# Patient Record
Sex: Female | Born: 1990 | Race: White | Hispanic: No | Marital: Married | Smoking: Current some day smoker
Health system: Southern US, Community
[De-identification: ages and names within clinical notes are randomized; demographics above are authoritative.]

## PROBLEM LIST (undated history)

## (undated) ENCOUNTER — Inpatient Hospital Stay (HOSPITAL_COMMUNITY): Payer: Self-pay

## (undated) DIAGNOSIS — D6959 Other secondary thrombocytopenia: Secondary | ICD-10-CM

## (undated) DIAGNOSIS — F1110 Opioid abuse, uncomplicated: Secondary | ICD-10-CM

## (undated) DIAGNOSIS — D696 Thrombocytopenia, unspecified: Secondary | ICD-10-CM

## (undated) DIAGNOSIS — F1010 Alcohol abuse, uncomplicated: Secondary | ICD-10-CM

## (undated) DIAGNOSIS — O99119 Other diseases of the blood and blood-forming organs and certain disorders involving the immune mechanism complicating pregnancy, unspecified trimester: Secondary | ICD-10-CM

---
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
## 2009-08-22 ENCOUNTER — Ambulatory Visit (HOSPITAL_COMMUNITY): Admission: RE | Admit: 2009-08-22 | Discharge: 2009-08-22 | Payer: Self-pay | Admitting: Obstetrics & Gynecology

## 2009-10-03 ENCOUNTER — Ambulatory Visit (HOSPITAL_COMMUNITY): Admission: RE | Admit: 2009-10-03 | Discharge: 2009-10-03 | Payer: Self-pay | Admitting: Obstetrics & Gynecology

## 2010-02-21 ENCOUNTER — Ambulatory Visit: Payer: Self-pay | Admitting: Obstetrics & Gynecology

## 2010-02-21 ENCOUNTER — Inpatient Hospital Stay (HOSPITAL_COMMUNITY): Admission: AD | Admit: 2010-02-21 | Discharge: 2010-02-25 | Payer: Self-pay | Admitting: Obstetrics & Gynecology

## 2010-09-06 LAB — CBC
HCT: 23 % — ABNORMAL LOW (ref 36.0–46.0)
HCT: 29.8 % — ABNORMAL LOW (ref 36.0–46.0)
HCT: 31.7 % — ABNORMAL LOW (ref 36.0–46.0)
Hemoglobin: 10.7 g/dL — ABNORMAL LOW (ref 12.0–15.0)
MCH: 29.8 pg (ref 26.0–34.0)
MCHC: 34.6 g/dL (ref 30.0–36.0)
MCV: 88.3 fL (ref 78.0–100.0)
Platelets: 150 10*3/uL (ref 150–400)
Platelets: 173 10*3/uL (ref 150–400)
RBC: 2.76 MIL/uL — ABNORMAL LOW (ref 3.87–5.11)
RBC: 3.42 MIL/uL — ABNORMAL LOW (ref 3.87–5.11)
RBC: 3.59 MIL/uL — ABNORMAL LOW (ref 3.87–5.11)
RDW: 12.9 % (ref 11.5–15.5)
WBC: 14.5 10*3/uL — ABNORMAL HIGH (ref 4.0–10.5)

## 2010-09-06 LAB — COMPREHENSIVE METABOLIC PANEL
ALT: 17 U/L (ref 0–35)
AST: 33 U/L (ref 0–37)
BUN: 3 mg/dL — ABNORMAL LOW (ref 6–23)
Calcium: 6.6 mg/dL — ABNORMAL LOW (ref 8.4–10.5)
Creatinine, Ser: 0.85 mg/dL (ref 0.4–1.2)
GFR calc Af Amer: >60 mL/min (ref >60)
Glucose, Bld: 87 mg/dL (ref 70–99)
Potassium: 4.2 mEq/L (ref 3.5–5.1)
Sodium: 133 mEq/L — ABNORMAL LOW (ref 135–145)
Total Bilirubin: 0.5 mg/dL (ref 0.3–1.2)
Total Protein: 5.3 g/dL — ABNORMAL LOW (ref 6.0–8.3)

## 2010-09-06 LAB — MRSA PCR SCREENING: MRSA by PCR: NEGATIVE

## 2010-09-06 LAB — BASIC METABOLIC PANEL
BUN: 12 mg/dL (ref 6–23)
Calcium: 7.4 mg/dL — ABNORMAL LOW (ref 8.4–10.5)
Chloride: 108 mEq/L (ref 96–112)

## 2010-09-07 LAB — COMPREHENSIVE METABOLIC PANEL
ALT: 16 U/L (ref 0–35)
Albumin: 2.9 g/dL — ABNORMAL LOW (ref 3.5–5.2)
Alkaline Phosphatase: 251 U/L — ABNORMAL HIGH (ref 39–117)
CO2: 20 mEq/L (ref 19–32)
GFR calc Af Amer: >60 mL/min (ref >60)
GFR calc non Af Amer: >60 mL/min (ref >60)
Potassium: 3.9 mEq/L (ref 3.5–5.1)
Sodium: 135 mEq/L (ref 135–145)

## 2010-09-07 LAB — CBC
HCT: 31.2 % — ABNORMAL LOW (ref 36.0–46.0)
MCH: 29.8 pg (ref 26.0–34.0)
MCV: 87.9 fL (ref 78.0–100.0)
Platelets: 175 10*3/uL (ref 150–400)
WBC: 12.7 10*3/uL — ABNORMAL HIGH (ref 4.0–10.5)

## 2011-04-11 LAB — OB RESULTS CONSOLE RPR: RPR: NONREACTIVE

## 2011-04-11 LAB — OB RESULTS CONSOLE HIV ANTIBODY (ROUTINE TESTING): HIV: NONREACTIVE

## 2011-05-01 ENCOUNTER — Other Ambulatory Visit: Payer: Self-pay | Admitting: Obstetrics and Gynecology

## 2011-05-01 ENCOUNTER — Other Ambulatory Visit (HOSPITAL_COMMUNITY)
Admission: RE | Admit: 2011-05-01 | Discharge: 2011-05-01 | Disposition: A | Discharge status: Home / Self Care | Payer: BC Managed Care – PPO | Source: Ambulatory Visit | Attending: Obstetrics and Gynecology | Admitting: Obstetrics and Gynecology

## 2011-05-01 DIAGNOSIS — Z113 Encounter for screening for infections with a predominantly sexual mode of transmission: Secondary | ICD-10-CM | POA: Insufficient documentation

## 2011-05-01 DIAGNOSIS — Z01419 Encounter for gynecological examination (general) (routine) without abnormal findings: Secondary | ICD-10-CM | POA: Insufficient documentation

## 2011-11-15 ENCOUNTER — Encounter (HOSPITAL_COMMUNITY): Payer: Self-pay

## 2011-11-22 ENCOUNTER — Encounter (HOSPITAL_COMMUNITY): Payer: Self-pay

## 2011-11-22 ENCOUNTER — Encounter (HOSPITAL_COMMUNITY)
Admission: RE | Admit: 2011-11-22 | Discharge: 2011-11-22 | Disposition: A | Discharge status: Home / Self Care | Payer: BC Managed Care – PPO | Source: Ambulatory Visit | Attending: Obstetrics & Gynecology | Admitting: Obstetrics & Gynecology

## 2011-11-22 LAB — CBC
MCV: 96.5 fL (ref 78.0–100.0)
Platelets: 127 10*3/uL — ABNORMAL LOW (ref 150–400)
RBC: 3.73 MIL/uL — ABNORMAL LOW (ref 3.87–5.11)
RDW: 13.4 % (ref 11.5–15.5)
WBC: 12 10*3/uL — ABNORMAL HIGH (ref 4.0–10.5)

## 2011-11-22 LAB — SURGICAL PCR SCREEN
MRSA, PCR: NEGATIVE
Staphylococcus aureus: POSITIVE — AB

## 2011-11-22 NOTE — Patient Instructions (Addendum)
19 Caroline Brooks  11/22/2011   Your procedure is scheduled on:  11/26/11  Enter through the Main Entrance of [facility] at 6 AM.  Pick up the phone at the desk and dial [phone].   Call this number if you have problems the morning of surgery: [phone]   Remember:   Do not eat food:After Midnight.  Do not drink clear liquids: After Midnight.  Take these medicines the morning of surgery with A SIP OF WATER: NA   Do not wear jewelry, make-up or nail polish.  Do not wear lotions, powders, or perfumes. You may wear deodorant.  Do not shave 48 hours prior to surgery.  Do not bring valuables to the hospital.  Contacts, dentures or bridgework may not be worn into surgery.  Leave suitcase in the car. After surgery it may be brought to your room.  For patients admitted to the hospital, checkout time is 11:00 AM the day of discharge.   Patients discharged the day of surgery will not be allowed to drive home.  Name and phone number of your driver: NA  Special Instructions: CHG Shower Use Special Wash: 1/2 bottle night before surgery and 1/2 bottle morning of surgery.   Please read over the following fact sheets that you were given: MRSA Information

## 2011-11-23 LAB — RPR: RPR Ser Ql: NONREACTIVE

## 2011-11-25 ENCOUNTER — Other Ambulatory Visit: Payer: Self-pay | Admitting: Obstetrics & Gynecology

## 2011-11-26 ENCOUNTER — Encounter (HOSPITAL_COMMUNITY)
Admission: RE | Disposition: A | Discharge status: Home / Self Care | Payer: Self-pay | Source: Ambulatory Visit | Attending: Obstetrics & Gynecology

## 2011-11-26 ENCOUNTER — Encounter (HOSPITAL_COMMUNITY): Payer: Self-pay | Admitting: *Deleted

## 2011-11-26 ENCOUNTER — Encounter (HOSPITAL_COMMUNITY): Payer: Self-pay | Admitting: Anesthesiology

## 2011-11-26 ENCOUNTER — Inpatient Hospital Stay (HOSPITAL_COMMUNITY): Payer: BC Managed Care – PPO | Admitting: Anesthesiology

## 2011-11-26 ENCOUNTER — Inpatient Hospital Stay (HOSPITAL_COMMUNITY)
Admission: RE | Admit: 2011-11-26 | Discharge: 2011-11-28 | DRG: 371 | Disposition: A | Discharge status: Home / Self Care | Payer: BC Managed Care – PPO | Source: Ambulatory Visit | Attending: Obstetrics & Gynecology | Admitting: Obstetrics & Gynecology

## 2011-11-26 DIAGNOSIS — O34219 Maternal care for unspecified type scar from previous cesarean delivery: Secondary | ICD-10-CM

## 2011-11-26 DIAGNOSIS — Z3490 Encounter for supervision of normal pregnancy, unspecified, unspecified trimester: Secondary | ICD-10-CM

## 2011-11-26 DIAGNOSIS — Z01812 Encounter for preprocedural laboratory examination: Secondary | ICD-10-CM

## 2011-11-26 LAB — URINALYSIS, ROUTINE W REFLEX MICROSCOPIC
Glucose, UA: NEGATIVE mg/dL
Hgb urine dipstick: NEGATIVE
Ketones, ur: NEGATIVE mg/dL
Leukocytes, UA: NEGATIVE
Protein, ur: NEGATIVE mg/dL
pH: 7 (ref 5.0–8.0)

## 2011-11-26 LAB — COMPREHENSIVE METABOLIC PANEL
AST: 20 U/L (ref 0–37)
Albumin: 3.1 g/dL — ABNORMAL LOW (ref 3.5–5.2)
Alkaline Phosphatase: 234 U/L — ABNORMAL HIGH (ref 39–117)
BUN: 8 mg/dL (ref 6–23)
Chloride: 103 mEq/L (ref 96–112)
Potassium: 3.7 mEq/L (ref 3.5–5.1)
Sodium: 136 mEq/L (ref 135–145)
Total Bilirubin: 0.2 mg/dL — ABNORMAL LOW (ref 0.3–1.2)
Total Protein: 6.8 g/dL (ref 6.0–8.3)

## 2011-11-26 LAB — CBC
MCHC: 33.6 g/dL (ref 30.0–36.0)
Platelets: 125 10*3/uL — ABNORMAL LOW (ref 150–400)
RDW: 13.5 % (ref 11.5–15.5)
WBC: 12.6 10*3/uL — ABNORMAL HIGH (ref 4.0–10.5)

## 2011-11-26 SURGERY — Surgical Case
Anesthesia: Spinal | Site: Abdomen | Wound class: Clean Contaminated

## 2011-11-26 MED ORDER — FENTANYL CITRATE 0.05 MG/ML IJ SOLN
INTRAMUSCULAR | Status: AC
  Filled 2011-11-26: qty 2

## 2011-11-26 MED ORDER — MEPERIDINE HCL 25 MG/ML IJ SOLN: 6.2500 mg | INTRAMUSCULAR | Status: DC | PRN

## 2011-11-26 MED ORDER — SIMETHICONE 80 MG PO CHEW: 80.0000 mg | CHEWABLE_TABLET | ORAL | Status: DC | PRN

## 2011-11-26 MED ORDER — OXYTOCIN 10 UNIT/ML IJ SOLN
20.0000 [IU] | INTRAVENOUS | Status: DC | PRN
  Administered 2011-11-26: 20 [IU] via INTRAVENOUS

## 2011-11-26 MED ORDER — ONDANSETRON HCL 4 MG PO TABS: 4.0000 mg | ORAL_TABLET | ORAL | Status: DC | PRN

## 2011-11-26 MED ORDER — PHENYLEPHRINE HCL 10 MG/ML IJ SOLN
INTRAMUSCULAR | Status: DC | PRN
  Administered 2011-11-26: 80 ug via INTRAVENOUS
  Administered 2011-11-26: 40 ug via INTRAVENOUS
  Administered 2011-11-26: 80 ug via INTRAVENOUS

## 2011-11-26 MED ORDER — OXYCODONE-ACETAMINOPHEN 5-325 MG PO TABS
1.0000 | ORAL_TABLET | ORAL | Status: DC | PRN
  Administered 2011-11-27 (×3): 1 via ORAL
  Administered 2011-11-27 (×2): 2 via ORAL
  Administered 2011-11-27: 1 via ORAL
  Administered 2011-11-27 – 2011-11-28 (×4): 2 via ORAL
  Filled 2011-11-26: qty 2
  Filled 2011-11-26: qty 1
  Filled 2011-11-26: qty 2
  Filled 2011-11-26 (×2): qty 1
  Filled 2011-11-26 (×2): qty 2
  Filled 2011-11-26: qty 1
  Filled 2011-11-26 (×2): qty 2

## 2011-11-26 MED ORDER — ZOLPIDEM TARTRATE 5 MG PO TABS: 5.0000 mg | ORAL_TABLET | Freq: Every evening | ORAL | Status: DC | PRN

## 2011-11-26 MED ORDER — DIPHENHYDRAMINE HCL 25 MG PO CAPS
25.0000 mg | ORAL_CAPSULE | ORAL | Status: DC | PRN
  Administered 2011-11-26: 25 mg via ORAL
  Filled 2011-11-26: qty 1

## 2011-11-26 MED ORDER — NALOXONE HCL 0.4 MG/ML IJ SOLN: 0.4000 mg | INTRAMUSCULAR | Status: DC | PRN

## 2011-11-26 MED ORDER — ONDANSETRON HCL 4 MG/2ML IJ SOLN
INTRAMUSCULAR | Status: AC
  Filled 2011-11-26: qty 2

## 2011-11-26 MED ORDER — PRENATAL MULTIVITAMIN CH
1.0000 | ORAL_TABLET | Freq: Every day | ORAL | Status: DC
  Administered 2011-11-27 – 2011-11-28 (×2): 1 via ORAL
  Filled 2011-11-26 (×2): qty 1

## 2011-11-26 MED ORDER — ACETAMINOPHEN 10 MG/ML IV SOLN
1000.0000 mg | Freq: Four times a day (QID) | INTRAVENOUS | Status: AC | PRN
  Filled 2011-11-26: qty 100

## 2011-11-26 MED ORDER — MUPIROCIN 2 % EX OINT
TOPICAL_OINTMENT | Freq: Two times a day (BID) | CUTANEOUS | Status: DC
  Administered 2011-11-26: 1 via NASAL

## 2011-11-26 MED ORDER — ONDANSETRON HCL 4 MG/2ML IJ SOLN: 4.0000 mg | INTRAMUSCULAR | Status: DC | PRN

## 2011-11-26 MED ORDER — SCOPOLAMINE 1 MG/3DAYS TD PT72
MEDICATED_PATCH | TRANSDERMAL | Status: AC
  Administered 2011-11-26: 1.5 mg via TRANSDERMAL
  Filled 2011-11-26: qty 1

## 2011-11-26 MED ORDER — PHENYLEPHRINE 40 MCG/ML (10ML) SYRINGE FOR IV PUSH (FOR BLOOD PRESSURE SUPPORT)
PREFILLED_SYRINGE | INTRAVENOUS | Status: AC
  Filled 2011-11-26: qty 5

## 2011-11-26 MED ORDER — PROMETHAZINE HCL 25 MG/ML IJ SOLN: 6.2500 mg | INTRAMUSCULAR | Status: DC | PRN

## 2011-11-26 MED ORDER — SODIUM CHLORIDE 0.9 % IJ SOLN: 3.0000 mL | INTRAMUSCULAR | Status: DC | PRN

## 2011-11-26 MED ORDER — OXYTOCIN 10 UNIT/ML IJ SOLN
INTRAMUSCULAR | Status: AC
  Filled 2011-11-26: qty 2

## 2011-11-26 MED ORDER — MORPHINE SULFATE (PF) 0.5 MG/ML IJ SOLN
INTRAMUSCULAR | Status: DC | PRN
  Administered 2011-11-26: .15 mg via EPIDURAL

## 2011-11-26 MED ORDER — DIPHENHYDRAMINE HCL 50 MG/ML IJ SOLN: 12.5000 mg | INTRAMUSCULAR | Status: DC | PRN

## 2011-11-26 MED ORDER — CEFAZOLIN SODIUM 1-5 GM-% IV SOLN
1.0000 g | INTRAVENOUS | Status: AC
  Administered 2011-11-26: 1 g via INTRAVENOUS

## 2011-11-26 MED ORDER — NALBUPHINE HCL 10 MG/ML IJ SOLN
5.0000 mg | INTRAMUSCULAR | Status: DC | PRN
  Filled 2011-11-26: qty 1

## 2011-11-26 MED ORDER — NALBUPHINE SYRINGE 5 MG/0.5 ML
INJECTION | INTRAMUSCULAR | Status: AC
  Administered 2011-11-26: 10 mg via SUBCUTANEOUS
  Filled 2011-11-26: qty 1

## 2011-11-26 MED ORDER — ACETAMINOPHEN 325 MG PO TABS: 325.0000 mg | ORAL_TABLET | ORAL | Status: DC | PRN

## 2011-11-26 MED ORDER — EPHEDRINE SULFATE 50 MG/ML IJ SOLN
INTRAMUSCULAR | Status: DC | PRN
  Administered 2011-11-26 (×3): 10 mg via INTRAVENOUS

## 2011-11-26 MED ORDER — LANOLIN HYDROUS EX OINT: 1.0000 "application " | TOPICAL_OINTMENT | CUTANEOUS | Status: DC | PRN

## 2011-11-26 MED ORDER — DIPHENHYDRAMINE HCL 50 MG/ML IJ SOLN: 12.5000 mg | Freq: Four times a day (QID) | INTRAMUSCULAR | Status: DC | PRN

## 2011-11-26 MED ORDER — SCOPOLAMINE 1 MG/3DAYS TD PT72
1.0000 | MEDICATED_PATCH | Freq: Once | TRANSDERMAL | Status: DC
  Filled 2011-11-26: qty 1

## 2011-11-26 MED ORDER — KETOROLAC TROMETHAMINE 30 MG/ML IJ SOLN
30.0000 mg | Freq: Four times a day (QID) | INTRAMUSCULAR | Status: AC | PRN
  Administered 2011-11-26: 30 mg via INTRAVENOUS
  Filled 2011-11-26: qty 1

## 2011-11-26 MED ORDER — OXYTOCIN 20 UNITS IN LACTATED RINGERS INFUSION - SIMPLE
125.0000 mL/h | INTRAVENOUS | Status: AC
  Administered 2011-11-26: 125 mL/h via INTRAVENOUS
  Filled 2011-11-26: qty 1000

## 2011-11-26 MED ORDER — EPHEDRINE 5 MG/ML INJ
INTRAVENOUS | Status: AC
  Filled 2011-11-26: qty 10

## 2011-11-26 MED ORDER — ONDANSETRON HCL 4 MG/2ML IJ SOLN
INTRAMUSCULAR | Status: DC | PRN
  Administered 2011-11-26: 4 mg via INTRAVENOUS

## 2011-11-26 MED ORDER — FENTANYL CITRATE 0.05 MG/ML IJ SOLN: 25.0000 ug | INTRAMUSCULAR | Status: DC | PRN

## 2011-11-26 MED ORDER — DIPHENHYDRAMINE HCL 50 MG/ML IJ SOLN: 25.0000 mg | INTRAMUSCULAR | Status: DC | PRN

## 2011-11-26 MED ORDER — ONDANSETRON HCL 4 MG/2ML IJ SOLN: 4.0000 mg | Freq: Three times a day (TID) | INTRAMUSCULAR | Status: DC | PRN

## 2011-11-26 MED ORDER — SODIUM CHLORIDE 0.9 % IV SOLN
1.0000 ug/kg/h | INTRAVENOUS | Status: DC | PRN
  Filled 2011-11-26: qty 2.5

## 2011-11-26 MED ORDER — SIMETHICONE 80 MG PO CHEW
80.0000 mg | CHEWABLE_TABLET | Freq: Three times a day (TID) | ORAL | Status: DC
  Administered 2011-11-26 – 2011-11-28 (×7): 80 mg via ORAL

## 2011-11-26 MED ORDER — TETANUS-DIPHTH-ACELL PERTUSSIS 5-2.5-18.5 LF-MCG/0.5 IM SUSP
0.5000 mL | Freq: Once | INTRAMUSCULAR | Status: AC
  Administered 2011-11-27: 0.5 mL via INTRAMUSCULAR
  Filled 2011-11-26: qty 0.5

## 2011-11-26 MED ORDER — WITCH HAZEL-GLYCERIN EX PADS: 1.0000 "application " | MEDICATED_PAD | CUTANEOUS | Status: DC | PRN

## 2011-11-26 MED ORDER — DIPHENHYDRAMINE HCL 12.5 MG/5ML PO ELIX
12.5000 mg | ORAL_SOLUTION | Freq: Four times a day (QID) | ORAL | Status: DC | PRN
  Filled 2011-11-26: qty 5

## 2011-11-26 MED ORDER — DIBUCAINE 1 % RE OINT: 1.0000 "application " | TOPICAL_OINTMENT | RECTAL | Status: DC | PRN

## 2011-11-26 MED ORDER — KETOROLAC TROMETHAMINE 30 MG/ML IJ SOLN: 30.0000 mg | Freq: Four times a day (QID) | INTRAMUSCULAR | Status: AC | PRN

## 2011-11-26 MED ORDER — SODIUM CHLORIDE 0.9 % IJ SOLN: 9.0000 mL | INTRAMUSCULAR | Status: DC | PRN

## 2011-11-26 MED ORDER — IBUPROFEN 600 MG PO TABS
600.0000 mg | ORAL_TABLET | Freq: Four times a day (QID) | ORAL | Status: DC
  Administered 2011-11-26 – 2011-11-28 (×7): 600 mg via ORAL
  Filled 2011-11-26 (×7): qty 1

## 2011-11-26 MED ORDER — NALBUPHINE HCL 10 MG/ML IJ SOLN
5.0000 mg | INTRAMUSCULAR | Status: DC | PRN
  Administered 2011-11-26: 10 mg via SUBCUTANEOUS
  Filled 2011-11-26: qty 1

## 2011-11-26 MED ORDER — LACTATED RINGERS IV SOLN: INTRAVENOUS | Status: DC

## 2011-11-26 MED ORDER — MUPIROCIN 2 % EX OINT
TOPICAL_OINTMENT | CUTANEOUS | Status: AC
  Administered 2011-11-26: 1 via NASAL
  Filled 2011-11-26: qty 22

## 2011-11-26 MED ORDER — ONDANSETRON HCL 4 MG/2ML IJ SOLN
4.0000 mg | Freq: Four times a day (QID) | INTRAMUSCULAR | Status: DC | PRN
Start: 2011-11-26 — End: 2011-11-26

## 2011-11-26 MED ORDER — MORPHINE SULFATE 0.5 MG/ML IJ SOLN
INTRAMUSCULAR | Status: AC
  Filled 2011-11-26: qty 10

## 2011-11-26 MED ORDER — FENTANYL CITRATE 0.05 MG/ML IJ SOLN
INTRAMUSCULAR | Status: DC | PRN
  Administered 2011-11-26: 25 ug via INTRATHECAL

## 2011-11-26 MED ORDER — SENNOSIDES-DOCUSATE SODIUM 8.6-50 MG PO TABS
2.0000 | ORAL_TABLET | Freq: Every day | ORAL | Status: DC
  Administered 2011-11-26: 2 via ORAL

## 2011-11-26 MED ORDER — MIDAZOLAM HCL 2 MG/2ML IJ SOLN: 0.5000 mg | Freq: Once | INTRAMUSCULAR | Status: DC | PRN

## 2011-11-26 MED ORDER — MENTHOL 3 MG MT LOZG: 1.0000 | LOZENGE | OROMUCOSAL | Status: DC | PRN

## 2011-11-26 MED ORDER — SCOPOLAMINE 1 MG/3DAYS TD PT72
1.0000 | MEDICATED_PATCH | Freq: Once | TRANSDERMAL | Status: DC
  Administered 2011-11-26: 1.5 mg via TRANSDERMAL

## 2011-11-26 MED ORDER — LACTATED RINGERS IV SOLN
INTRAVENOUS | Status: DC
  Administered 2011-11-26 (×3): via INTRAVENOUS

## 2011-11-26 MED ORDER — METOCLOPRAMIDE HCL 5 MG/ML IJ SOLN: 10.0000 mg | Freq: Three times a day (TID) | INTRAMUSCULAR | Status: DC | PRN

## 2011-11-26 MED ORDER — DIPHENHYDRAMINE HCL 25 MG PO CAPS: 25.0000 mg | ORAL_CAPSULE | Freq: Four times a day (QID) | ORAL | Status: DC | PRN

## 2011-11-26 MED ORDER — MORPHINE SULFATE (PF) 1 MG/ML IV SOLN
INTRAVENOUS | Status: DC
  Administered 2011-11-26: 11 mg via INTRAVENOUS
  Administered 2011-11-26 (×2): via INTRAVENOUS
  Administered 2011-11-26: 13 mg via INTRAVENOUS
  Filled 2011-11-26 (×2): qty 25

## 2011-11-26 MED ORDER — CEFAZOLIN SODIUM 1-5 GM-% IV SOLN
INTRAVENOUS | Status: AC
  Filled 2011-11-26: qty 50

## 2011-11-26 SURGICAL SUPPLY — 28 items
ADH SKN CLS APL DERMABOND .7 (GAUZE/BANDAGES/DRESSINGS) ×2
CLOTH BEACON ORANGE TIMEOUT ST (SAFETY) ×2 IMPLANT
DERMABOND ADVANCED (GAUZE/BANDAGES/DRESSINGS) ×2
DERMABOND ADVANCED .7 DNX12 (GAUZE/BANDAGES/DRESSINGS) ×2 IMPLANT
DURAPREP 26ML APPLICATOR (WOUND CARE) ×4 IMPLANT
ELECT REM PT RETURN 9FT ADLT (ELECTROSURGICAL) ×2
ELECTRODE REM PT RTRN 9FT ADLT (ELECTROSURGICAL) ×1 IMPLANT
GLOVE BIOGEL PI IND STRL 8 (GLOVE) ×2 IMPLANT
GLOVE BIOGEL PI INDICATOR 8 (GLOVE) ×2
GLOVE ECLIPSE 8.0 STRL XLNG CF (GLOVE) ×2 IMPLANT
KIT ABG SYR 3ML LUER SLIP (SYRINGE) ×2 IMPLANT
NDL HYPO 25X5/8 SAFETYGLIDE (NEEDLE) ×1 IMPLANT
NEEDLE HYPO 25X5/8 SAFETYGLIDE (NEEDLE) ×2 IMPLANT
NS IRRIG 1000ML POUR BTL (IV SOLUTION) ×2 IMPLANT
PACK C SECTION WH (CUSTOM PROCEDURE TRAY) ×2 IMPLANT
RTRCTR C-SECT PINK 25CM LRG (MISCELLANEOUS) ×1 IMPLANT
SUT CHROMIC 0 CT 1 (SUTURE) ×2 IMPLANT
SUT MNCRL 0 VIOLET CTX 36 (SUTURE) ×2 IMPLANT
SUT MONOCRYL 0 CTX 36 (SUTURE) ×2
SUT PLAIN 2 0 (SUTURE)
SUT PLAIN 2 0 XLH (SUTURE) IMPLANT
SUT PLAIN ABS 2-0 CT1 27XMFL (SUTURE) IMPLANT
SUT VIC AB 0 CTX 36 (SUTURE) ×2
SUT VIC AB 0 CTX36XBRD ANBCTRL (SUTURE) ×1 IMPLANT
SUT VIC AB 4-0 KS 27 (SUTURE) ×1 IMPLANT
TOWEL OR 17X24 6PK STRL BLUE (TOWEL DISPOSABLE) ×4 IMPLANT
TRAY FOLEY CATH 14FR (SET/KITS/TRAYS/PACK) ×1 IMPLANT
WATER STERILE IRR 1000ML POUR (IV SOLUTION) ×2 IMPLANT

## 2011-11-26 NOTE — Anesthesia Procedure Notes (Signed)

## 2011-11-26 NOTE — Op Note (Addendum)
Caroline Brooks PROCEDURE DATE: 11/26/2011  PREOPERATIVE DIAGNOSIS: Intrauterine pregnancy at  [redacted]w[redacted]d weeks gestation; elective repeat  POSTOPERATIVE DIAGNOSIS: The same  PROCEDURE: Repeat Low Transverse Cesarean Section  SURGEON:  Dr. Turner Daniels  ASSISTANT: Dr Candelaria Celeste  INDICATIONS: Caroline Brooks is a 21 y.o. G2P1002 at [redacted]w[redacted]d scheduled for cesarean section secondary to elective repeat.  The risks of cesarean section discussed with the patient included but were not limited to: bleeding which may require transfusion or reoperation; infection which may require antibiotics; injury to bowel, bladder, ureters or other surrounding organs; injury to the fetus; need for additional procedures including hysterectomy in the event of a life-threatening hemorrhage; placental abnormalities wth subsequent pregnancies, incisional problems, thromboembolic phenomenon and other postoperative/anesthesia complications. The patient concurred with the proposed plan, giving informed written consent for the procedure.    FINDINGS:  Viable female infant in cephalic presentation.  Apgars 9 and 9, weight pending.  Clear amniotic fluid.  Intact placenta, three Brooks cord.  Normal uterus, fallopian tubes and ovaries bilaterally.  ANESTHESIA:    Spinal INTRAVENOUS FLUIDS: 2000 ml ESTIMATED BLOOD LOSS: 700 ml URINE OUTPUT:  600 ml SPECIMENS: Placenta sent to pathology COMPLICATIONS: None immediate  PROCEDURE IN DETAIL:  The patient received intravenous antibiotics and had sequential compression devices applied to her lower extremities while in the preoperative area.  She was then taken to the operating room where spinal anesthesia was administered and was found to be adequate. She was then placed in a dorsal supine position with a leftward tilt, and prepped and draped in a sterile manner.  A foley catheter was placed into her bladder and attached to constant gravity, which drained clear fluid throughout.  After an  adequate timeout was performed, a Pfannenstiel skin incision was made with scalpel and carried through to the underlying layer of fascia. The fascia was incised in the midline and this incision was extended bilaterally using the Mayo scissors. Kocher clamps were applied to the superior aspect of the fascial incision and the underlying rectus muscles were dissected off with cautery. A similar process was carried out on the inferior aspect of the facial incision. The rectus muscles were separated in the midline bluntly and the peritoneum was entered bluntly. Attention was turned to the lower uterine segment where a transverse hysterotomy was made with a scalpel and extended bilaterally bluntly. The bladder blade was then removed. The infant was successfully delivered, and cord was clamped and cut and infant was handed over to awaiting neonatology team. Uterine massage was then administered and the placenta delivered intact with three-Brooks cord. The uterus was then cleared of clot and debris.  The hysterotomy was closed with 0 Monocryl in a single running locked fashion. Overall, excellent hemostasis was noted. The abdomen and the pelvis were irrigated and cleared of all clot and debris. Hemostasis was confirmed on all surfaces.  The rectus muscles were reapproximated using 2-0 Monocryl stitches. The fascia was then closed using 0 Vicryl in a running fashion.  The old cicatrix was excised with scalpel.  The subcutaneous layer was reapproximated with plain gut and the skin was closed with 4-0 Vicryl. The patient tolerated the procedure well. Sponge, lap, instrument and needle counts were correct x 2. She was taken to the recovery room in stable condition.    Caroline Brooks Caroline Brooks 11/26/2011 8:16 AM

## 2011-11-26 NOTE — H&P (Signed)
Caroline Brooks is a 21 y.o. female G2P1 with IUP at [redacted]w[redacted]d presenting for elective repeat cesarean section. Pt states she has been having no contractions, no vaginal bleeding, intact membranes, with active fetal movement.    Prenatal Course Source of Care: [facility] with onset of care at 6weeks Pregnancy complications or risks:There is no problem list on file for this patient.  She desires to IUD.  She plans to breastfeed  Prenatal labs and studies: ABO, Rh: B/Positive/-- (10/18 0813) Antibody: Negative (10/18 0813) Rubella:  immune RPR: NON REACTIVE (05/31 1350)  HBsAg: Negative (10/18 0813)  HIV: Non-reactive (10/18 0813)  GBS:   pending 2 hr Glucola 75,134, 83 Genetic screeningnormal Anatomy US normal  Past Medical History: No past medical history on file.  Past Surgical History:  Past Surgical History  Procedure Date  . Cesarean section     Obstetrical History:  OB History    Grav Para Term Preterm Abortions TAB SAB Ect Mult Living   2 1        1       Gynecological History:  OB History    Grav Para Term Preterm Abortions TAB SAB Ect Mult Living   2 1        1       Social History:  History   Social History  . Marital Status: Married    Spouse Name: N/A    Number of Children: N/A  . Years of Education: N/A   Social History Main Topics  . Smoking status: Former Games developer  . Smokeless tobacco: Not on file  . Alcohol Use: No  . Drug Use: No  . Sexually Active:    Other Topics Concern  . Not on file   Social History Narrative  . No narrative on file    Family History: No family history on file.  Medications:  Prenatal vitamins,  Current Facility-Administered Medications  Medication Dose Route Frequency Provider Last Rate Last Dose  . ceFAZolin (ANCEF) 1-5 GM-% IVPB           . ceFAZolin (ANCEF) IVPB 1 g/50 mL premix  1 g Intravenous On Call to OR Lazaro Arms, MD      . lactated ringers infusion   Intravenous Continuous Jiles Garter, MD      . mupirocin ointment (BACTROBAN) 2 %   Nasal BID Lazaro Arms, MD   1 application at 11/26/11 [phone]  . scopolamine (TRANSDERM-SCOP) 1.5 MG 1.5 mg  1 patch Transdermal Once Jiles Garter, MD   1.5 mg at 11/26/11 0981    Allergies: No Known Allergies  Review of Systems: - Negative  Physical Exam: Blood pressure 112/58, pulse 83, temperature 97.9 F (36.6 C), temperature source Oral, resp. rate 18, SpO2 100.00%. GENERAL: Well-developed, well-nourished female in no acute distress.  LUNGS: Clear to auscultation bilaterally.  HEART: Regular rate and rhythm. ABDOMEN: Soft, nontender, nondistended, gravid. EFW 7-7.5 lbs EXTREMITIES: Nontender, no edema, 2+ distal pulses.   Pertinent Labs/Studies:  none  Assessment : Caroline Brooks is a 21 y.o. G2P1 at [redacted]w[redacted]d being admitted for repeat elective cesarean section  Plan: Will proceed with cesarean section.  Risks discussed with patient including infection, bleeding, damage to internal organs. She has consented to the procedure.   Caydin Yeatts JEHIEL 11/26/2011, 6:28 AM

## 2011-11-26 NOTE — Anesthesia Preprocedure Evaluation (Signed)

## 2011-11-26 NOTE — Anesthesia Postprocedure Evaluation (Signed)
  Anesthesia Post-op Note  Patient: Caroline Brooks  Procedure(s) Performed: Procedure(s) (LRB): CESAREAN SECTION (N/A)   Patient is awake, responsive, moving her legs, and has signs of resolution of her numbness. Pain and nausea are reasonably well controlled. Vital signs are stable and clinically acceptable. Oxygen saturation is clinically acceptable. There are no apparent anesthetic complications at this time. Patient is ready for discharge.

## 2011-11-26 NOTE — Transfer of Care (Signed)
Immediate Anesthesia Transfer of Care Note  Patient: Caroline Brooks  Procedure(s) Performed: Procedure(s) (LRB): CESAREAN SECTION (N/A)  Patient Location: PACU  Anesthesia Type: Spinal  Level of Consciousness: awake, alert  and oriented  Airway & Oxygen Therapy: Patient Spontanous Breathing  Post-op Assessment: Report given to PACU RN and Post -op Vital signs reviewed and stable  Post vital signs: Reviewed and stable  Complications: No apparent anesthesia complications

## 2011-11-27 ENCOUNTER — Encounter (HOSPITAL_COMMUNITY): Payer: Self-pay | Admitting: Obstetrics & Gynecology

## 2011-11-27 LAB — CBC
MCH: 32.1 pg (ref 26.0–34.0)
Platelets: 103 10*3/uL — ABNORMAL LOW (ref 150–400)
RBC: 3.24 MIL/uL — ABNORMAL LOW (ref 3.87–5.11)
RDW: 13.5 % (ref 11.5–15.5)
WBC: 11.5 10*3/uL — ABNORMAL HIGH (ref 4.0–10.5)

## 2011-11-27 NOTE — Progress Notes (Signed)
Patient ID: Caroline Brooks, female   DOB: February 16, 1991, 21 y.o.   MRN: [id number] POD #1 [id number] Saw pt and agree with resident's documentation

## 2011-11-27 NOTE — Progress Notes (Signed)
Subjective: Postpartum Day 1: Cesarean Delivery Patient reports tolerating PO, + flatus, + BM and no problems voiding.    Objective: Vital signs in last 24 hours: Temp:  [97.3 F (36.3 C)-98.4 F (36.9 C)] 98.3 F (36.8 C) (06/05 0230) Pulse Rate:  [63-99] 64  (06/05 0230) Resp:  [16-18] 18  (06/05 0230) BP: (86-128)/(45-74) 120/72 mmHg (06/05 0230) SpO2:  [96 %-100 %] 98 % (06/05 0230) Weight:  [84.823 kg (187 lb)] 84.823 kg (187 lb) (06/04 4098)  Physical Exam:  General: alert, cooperative and no distress Lochia: appropriate Uterine Fundus: firm Incision: healing well, no significant drainage, no dehiscence, no significant erythema DVT Evaluation: No evidence of DVT seen on physical exam. Negative Homan's sign. No cords or calf tenderness. No significant calf/ankle edema.   Basename 11/27/11 0540 11/26/11 0615  HGB 10.4* 12.1  HCT 31.9* 36.0    Assessment/Plan: Status post Cesarean section. Doing well postoperatively.  Continue current care. Plans for Mirena Bottle Feeding  Andrena Mews, DO [facility] Resident - PGY-1 11/27/2011 7:50 AM

## 2011-11-28 MED ORDER — IBUPROFEN 600 MG PO TABS: 600.0000 mg | ORAL_TABLET | Freq: Four times a day (QID) | ORAL | Status: AC

## 2011-11-28 MED ORDER — OXYCODONE-ACETAMINOPHEN 5-325 MG PO TABS: 1.0000 | ORAL_TABLET | ORAL | Status: AC | PRN

## 2011-11-28 MED ORDER — SENNOSIDES-DOCUSATE SODIUM 8.6-50 MG PO TABS: 2.0000 | ORAL_TABLET | Freq: Two times a day (BID) | ORAL | Status: AC | PRN

## 2011-11-28 NOTE — Discharge Instructions (Signed)
Please call for your appointment time with [facility] in 6 weeks.  Please let them know at the time you wish for Mirena at your post-partum visit.  Cesarean Delivery Care After Refer to this sheet in the next few weeks. These instructions provide you with information on caring for yourself after your procedure. Your caregiver may also give you more specific instructions. Your treatment has been planned according to current medical practices, but problems sometimes occur. Call your caregiver if you have any problems or questions after your procedure. HOME CARE INSTRUCTIONS Healing will take time. You will have discomfort, tenderness, swelling, and bruising at the surgery site for a couple of weeks. This is normal and will get better as time goes on. Activity  Rest as much as possible the first 2 weeks.   When possible, have someone help you with your household activities and your baby for 2 to 3 weeks.   Limit your housework and social activity. Increase your activity gradually as your strength returns.   Do not climb stairs more than 2 to 3 times a day.   Do not lift anything heavier than your baby.   Follow your caregiver's instructions about driving a car.   Exercise only as directed by your caregiver.  Nutrition  You may return to your usual diet. Eat a well-balanced diet.   Drink enough fluid to keep your urine clear or pale yellow.   Keep taking your prenatal or multivitamins.   Do not drink alcohol until your caregiver says it is okay.  Elimination You should return to your usual bowel function. If you develop constipation, ask your caregiver about taking a mild laxative that will help you go to the bathroom. Bran foods and fluids help with constipation. Gradually add fruit, vegetables, and bran to your diet.  Hygiene  You may shower, wash your hair, and take tub baths unless your caregiver tells you otherwise.   Continue perineal care until your vaginal bleeding and  discharge stops.   Do not douche or use tampons until your caregiver says it is okay.  Fever If you feel feverish or have shaking chills, take your temperature. The fever may indicate infection. Infections can be treated with antibiotic medicine. Pain Control and Medicine  Only take over-the-counter or prescription medicine as directed by your caregiver. Do not take aspirin. It can cause bleeding.   Do not drive when taking pain medicine.   Talk to your caregiver about restarting or adjusting your normal medicines.  Incision Care  Clean your cut (incision) gently with soap and water, then pat dry.   If your caregiver says it is okay, leave the incision without a bandage (dressing) unless it is draining fluid or irritated.   If you have small adhesive strips across the incision and they do not fall off within 7 days, carefully peel them off.   Check the incision daily for increased redness, drainage, swelling, or separation of skin.   Hug a pillow when coughing or sneezing. This helps to relieve pain.  Vaginal Care You may have a vaginal discharge or bleeding for up to 6 weeks. If the vaginal discharge becomes bright red, bad smelling, heavy in amount, has blood clots, or if you have burning or frequent urination, call your caregiver.If your bleeding slows down and then gets heavier, your body is telling you to slow down and relax more. Sexual Intercourse  Check with your caregiver before resuming sexual activity. Often, after 4 to 6 weeks, if you  feel good and are well rested, sexual activity may be resumed. Avoid positions that strain the incision site.   You can become pregnant before you have a period. If you decide to have sexual intercourse, use birth control if you do not want to become pregnant right away.  Health Practices  Keep all your postpartum appointments as recommended by your caregiver. Generally, your caregiver will want to see you in 2 to 3 weeks.   Continue with  your yearly pelvic exams.   Continue monthly self-breast exams and yearly physical exams with a Pap test.  Breast Care  If you are not breastfeeding and your breasts become tender, hard, or leak milk, you may wear a tight-fitting bra and apply ice to your breasts.   If you are breastfeeding, wear a good support bra.   Call your caregiver if you have breast pain, flu-like symptoms, fever, or hardness and reddening of your breasts.  Postpartum Blues You may have a period of low spirits or "blues" after your baby is born. Discuss your feelings with your partner, family, and friends. This may be caused by the changing hormone levels in your body. You may want to contact your caregiver if this is worrisome. Miscellaneous  Limit wearing support panties or control-top hose.   If you breastfeed, you may not have a period for several months or longer. This is normal for the nursing mother. If you do not menstruate within 6 weeks after you stop breastfeeding, see your caregiver.   If you are not breastfeeding, you can expect to menstruate within 6 to 10 weeks after birth. If you have not started by the 11th week, check with your caregiver.  SEEK MEDICAL CARE IF:   There is swelling, redness, or increasing pain in the wound area.   You have pus coming from the wound.   You notice a bad smell from the wound or surgical dressing.   You have pain, redness, and swelling from the intravenous (IV) site.   Your wound breaks open (the edges are not staying together).   You feel dizzy or feel like fainting.   You develop pain or bleeding when you urinate.   You develop diarrhea.   You develop nausea and vomiting.   You develop abnormal vaginal discharge.   You develop a rash.   You have any type of abnormal reaction or develop an allergy to your medicine.   Your pain is not relieved by your medicine or becomes worse.   Your temperature is 101 F (38.3 C), or is 100.4 F (38 C) taken 2  times in a 4 hour period.  SEEK IMMEDIATE MEDICAL CARE:  You develop a temperature of 102 F (38.9 C) or higher.   You develop abdominal pain.   You develop chest pain.   You develop shortness of breath.   You faint.   You develop pain, swelling, or redness of your leg.   You develop heavy vaginal bleeding with or without blood clots.  Document Released: 03/02/2002 Document Revised: 05/30/2011 Document Reviewed: 09/05/2010 [facility] Patient Information 2012 [place], [place].

## 2011-11-28 NOTE — Discharge Summary (Signed)
Pt seen and examined.  Agree with above note. 

## 2011-11-28 NOTE — Discharge Summary (Signed)
Obstetric Discharge Summary Reason for Admission: cesarean section Prenatal Procedures: none Intrapartum Procedures: cesarean: low cervical, transverse Postpartum Procedures: none Complications-Operative and Postpartum: none Hemoglobin  Date Value Range Status  11/27/2011 10.4* 12.0-15.0 (g/dL) Final     HCT  Date Value Range Status  11/27/2011 31.9* 36.0-46.0 (%) Final    Physical Exam:  General: alert, cooperative and no distress Lochia: appropriate Uterine Fundus: firm Incision: healing well, no significant drainage, no dehiscence, no significant erythema DVT Evaluation: No evidence of DVT seen on physical exam. Negative Homan's sign. No cords or calf tenderness. No significant calf/ankle edema.  Discharge Diagnoses: Term Pregnancy-delivered  BRIEF HOSPITAL COURSE:  Pt was admitted for elective repeat C-Section.  Underwent uneventful surgery and was transferred to Mother Baby unit.  Hospital course was uncomplicated and pt recovered without difficulty.     Discharge Information: Date: 11/28/2011 Activity: pelvic rest Diet: routine Medications: Ibuprofen and Percocet Condition: stable Instructions: refer to practice specific booklet Discharge to: home   Newborn Data: Live born female  Birth Weight: 7 lb 6.7 oz (3365 g) APGAR: 9, 9  Home with mother. Mirena @ 6 week followup. Bottle Feeding.  Caroline Brooks 11/28/2011, 7:49 AM

## 2013-03-08 ENCOUNTER — Emergency Department (HOSPITAL_COMMUNITY)
Admission: EM | Admit: 2013-03-08 | Discharge: 2013-03-08 | Discharge status: Against Medical Advice | Payer: Medicaid Other | Attending: Emergency Medicine | Admitting: Emergency Medicine

## 2013-03-08 ENCOUNTER — Encounter (HOSPITAL_COMMUNITY): Payer: Self-pay | Admitting: *Deleted

## 2013-03-08 DIAGNOSIS — R109 Unspecified abdominal pain: Secondary | ICD-10-CM | POA: Insufficient documentation

## 2013-03-08 DIAGNOSIS — Z3202 Encounter for pregnancy test, result negative: Secondary | ICD-10-CM | POA: Insufficient documentation

## 2013-03-08 LAB — URINE MICROSCOPIC-ADD ON

## 2013-03-08 LAB — URINALYSIS, ROUTINE W REFLEX MICROSCOPIC
Nitrite: POSITIVE — AB
Protein, ur: 100 mg/dL — AB
Specific Gravity, Urine: 1.02 (ref 1.005–1.030)
Urobilinogen, UA: 0.2 mg/dL (ref 0.0–1.0)

## 2013-03-08 LAB — PREGNANCY, URINE: Preg Test, Ur: NEGATIVE

## 2013-03-08 NOTE — ED Notes (Signed)
abd , back pain,  Sees blood when she wipes.  Nausea, no vomiting.

## 2013-03-11 LAB — URINE CULTURE

## 2013-03-12 NOTE — ED Notes (Signed)
Post ED Visit - Positive Culture Follow-up: Successful Patient Follow-Up  Culture assessed and recommendations reviewed by: []  Wes Dulaney, Pharm.D., BCPS []  Celedonio Miyamoto, [address].D., BCPS [x]  Georgina Pillion, [address].D., BCPS []  Joyce, [address].D., BCPS, AAHIVP []  Estella Husk, Pharm.D., BCPS, AAHIVP  Positive Urine culture  []  Patient discharged without antimicrobial prescription and treatment is now indicated [x]  Organism is resistant to prescribed ED discharge antimicrobial []  Patient with positive blood cultures  Changes discussed with ED provider: Roxy Horseman New antibiotic prescription Macrobid 100 mg BID x 7 days   Contacted patient: LVM date 03/12/2013, time 1720  Larena Sox 03/12/2013, 5:31 PM

## 2013-03-12 NOTE — Progress Notes (Signed)
ED Antimicrobial Stewardship Positive Culture Follow Up   Caroline Brooks is an 22 y.o. female who presented to [facility] on 03/08/2013 with a chief complaint of abdominal pain  Chief Complaint  Patient presents with  . Abdominal Pain    Recent Results (from the past 720 hour(s))  URINE CULTURE     Status: None   Collection Time    03/08/13  6:35 PM      Result Value Range Status   Specimen Description URINE, CLEAN CATCH   Final   Special Requests NONE   Final   Culture  Setup Time     Final   Value: 03/09/2013 21:00     Performed at [facility] Count     Final   Value: >=100,000 COLONIES/ML     Performed at [facility]   Culture     Final   Value: ESCHERICHIA COLI     Performed at [facility]   Report Status 03/11/2013 FINAL   Final   Organism ID, Bacteria ESCHERICHIA COLI   Final    [x]  Patient discharged originally without antimicrobial agent and treatment is now indicated  New antibiotic prescription: Macrobid 100 mg bid x 7 days  ED Provider: Roxy Horseman, PA-C   Rolley Sims 03/12/2013, 10:11 AM Infectious Diseases Pharmacist Phone# [phone]

## 2013-03-13 ENCOUNTER — Telehealth (HOSPITAL_COMMUNITY): Payer: Self-pay | Admitting: *Deleted

## 2013-03-14 ENCOUNTER — Telehealth (HOSPITAL_COMMUNITY): Payer: Self-pay | Admitting: Emergency Medicine

## 2013-09-16 ENCOUNTER — Other Ambulatory Visit: Payer: Self-pay | Admitting: Obstetrics & Gynecology

## 2013-09-16 ENCOUNTER — Encounter: Payer: Self-pay | Admitting: *Deleted

## 2013-12-21 ENCOUNTER — Ambulatory Visit (INDEPENDENT_AMBULATORY_CARE_PROVIDER_SITE_OTHER): Payer: Medicaid Other | Admitting: Advanced Practice Midwife

## 2013-12-21 ENCOUNTER — Other Ambulatory Visit (HOSPITAL_COMMUNITY)
Admission: RE | Admit: 2013-12-21 | Discharge: 2013-12-21 | Disposition: A | Discharge status: Home / Self Care | Payer: Medicaid Other | Source: Ambulatory Visit | Attending: Advanced Practice Midwife | Admitting: Advanced Practice Midwife

## 2013-12-21 ENCOUNTER — Encounter: Payer: Self-pay | Admitting: Advanced Practice Midwife

## 2013-12-21 VITALS — BP 120/80 | Ht 68.5 in | Wt 157.0 lb

## 2013-12-21 DIAGNOSIS — Z113 Encounter for screening for infections with a predominantly sexual mode of transmission: Secondary | ICD-10-CM | POA: Insufficient documentation

## 2013-12-21 DIAGNOSIS — Z01419 Encounter for gynecological examination (general) (routine) without abnormal findings: Secondary | ICD-10-CM

## 2013-12-21 DIAGNOSIS — Z30432 Encounter for removal of intrauterine contraceptive device: Secondary | ICD-10-CM

## 2013-12-21 DIAGNOSIS — Z0000 Encounter for general adult medical examination without abnormal findings: Secondary | ICD-10-CM

## 2013-12-21 MED ORDER — NORETHINDRONE 0.35 MG PO TABS: 1.0000 | ORAL_TABLET | Freq: Every day | ORAL | Status: DC

## 2013-12-21 NOTE — Progress Notes (Signed)
Caroline Brooks 23 y.o.  Filed Vitals:   12/21/13 1036  BP: 120/80     Past Medical History: History reviewed. No pertinent past medical history.  Past Surgical History: Past Surgical History  Procedure Laterality Date  . Cesarean section    . Cesarean section  11/26/2011    Procedure: CESAREAN SECTION;  Surgeon: Florian Buff, MD;  Location: [facility];  Service: Gynecology;  Laterality: N/A;    Family History: Family History  Problem Relation Age of Onset  . Cancer Mother 57    breast   . Hypertension Father   . Other Brother     Alpha 1  . Cancer Maternal Grandfather     prostate  . Early death Paternal Grandfather   . Alcohol abuse Paternal Grandfather     Social History: History  Substance Use Topics  . Smoking status: Current Every Day Smoker -- 0.50 packs/day for 5 years    Types: Cigarettes  . Smokeless tobacco: Never Used  . Alcohol Use: Yes     Comment: occ    Allergies: No Known Allergies   Current outpatient prescriptions:acetaminophen (TYLENOL) 325 MG tablet, Take 650 mg by mouth daily as needed. headache, Disp: , Rfl: ;  Multiple Vitamins-Minerals (WOMENS MULTI VITAMIN & MINERAL PO), Take 1 tablet by mouth daily., Disp: , Rfl:   History of Present Illness: Here for well woman exam. Has had IUD for 2 years and still cramps. Wants to go back on pills.  Mom has estrogen + BrCa  Review of Systems   Patient denies any headaches, blurred vision, shortness of breath, chest pain, abdominal pain, problems with bowel movements, urination, or intercourse.   Physical Exam: General:  Well developed, well nourished, no acute distress Skin:  Warm and dry Neck:  Midline trachea, normal thyroid Lungs; Clear to auscultation bilaterally Breast:  No dominant palpable mass, retraction, or nipple discharge Cardiovascular: Regular rate and rhythm Abdomen:  Soft, non tender, no hepatosplenomegaly Pelvic:  External genitalia is normal in appearance.  The vagina is  normal in appearance.  The cervix is bulbous. IUD removed easily.  Uterus is felt to be normal size, shape, and contour.  No adnexal masses or tenderness noted. Extremities:  No swelling or varicosities noted Psych:  No mood changes   Impression:  Normal GYN exam IUD removal   Plan:  Micronoir (mom estrogen + BrCA age 24)  Condoms for the first month

## 2013-12-22 LAB — CYTOLOGY - PAP

## 2014-04-01 ENCOUNTER — Encounter: Payer: Medicaid Other | Admitting: Adult Health

## 2014-04-06 ENCOUNTER — Encounter: Payer: Medicaid Other | Admitting: Adult Health

## 2014-04-25 ENCOUNTER — Encounter: Payer: Self-pay | Admitting: Advanced Practice Midwife

## 2014-04-28 ENCOUNTER — Encounter: Payer: Self-pay | Admitting: Adult Health

## 2014-04-28 ENCOUNTER — Ambulatory Visit (INDEPENDENT_AMBULATORY_CARE_PROVIDER_SITE_OTHER): Payer: Medicaid Other | Admitting: Adult Health

## 2014-04-28 VITALS — BP 112/70 | Ht 69.0 in | Wt 178.0 lb

## 2014-04-28 DIAGNOSIS — Z3200 Encounter for pregnancy test, result unknown: Secondary | ICD-10-CM

## 2014-04-28 DIAGNOSIS — Z3201 Encounter for pregnancy test, result positive: Secondary | ICD-10-CM

## 2014-04-28 DIAGNOSIS — Z3490 Encounter for supervision of normal pregnancy, unspecified, unspecified trimester: Secondary | ICD-10-CM | POA: Insufficient documentation

## 2014-04-28 LAB — POCT URINE PREGNANCY: Preg Test, Ur: POSITIVE

## 2014-04-28 NOTE — Patient Instructions (Signed)
First Trimester of Pregnancy The first trimester of pregnancy is from week 1 until the end of week 12 (months 1 through 3). A week after a sperm fertilizes an egg, the egg will implant on the wall of the uterus. This embryo will begin to develop into a baby. Genes from you and your partner are forming the baby. The female genes determine whether the baby is a boy or a girl. At 6-8 weeks, the eyes and face are formed, and the heartbeat can be seen on ultrasound. At the end of 12 weeks, all the baby's organs are formed.  Now that you are pregnant, you will want to do everything you can to have a healthy baby. Two of the most important things are to get good prenatal care and to follow your health care provider's instructions. Prenatal care is all the medical care you receive before the baby's birth. This care will help prevent, find, and treat any problems during the pregnancy and childbirth. BODY CHANGES Your body goes through many changes during pregnancy. The changes vary from woman to woman.   You may gain or lose a couple of pounds at first.  You may feel sick to your stomach (nauseous) and throw up (vomit). If the vomiting is uncontrollable, call your health care provider.  You may tire easily.  You may develop headaches that can be relieved by medicines approved by your health care provider.  You may urinate more often. Painful urination may mean you have a bladder infection.  You may develop heartburn as a result of your pregnancy.  You may develop constipation because certain hormones are causing the muscles that push waste through your intestines to slow down.  You may develop hemorrhoids or swollen, bulging veins (varicose veins).  Your breasts may begin to grow larger and become tender. Your nipples may stick out more, and the tissue that surrounds them (areola) may become darker.  Your gums may bleed and may be sensitive to brushing and flossing.  Dark spots or blotches (chloasma,  mask of pregnancy) may develop on your face. This will likely fade after the baby is born.  Your menstrual periods will stop.  You may have a loss of appetite.  You may develop cravings for certain kinds of food.  You may have changes in your emotions from day to day, such as being excited to be pregnant or being concerned that something may go wrong with the pregnancy and baby.  You may have more vivid and strange dreams.  You may have changes in your hair. These can include thickening of your hair, rapid growth, and changes in texture. Some women also have hair loss during or after pregnancy, or hair that feels dry or thin. Your hair will most likely return to normal after your baby is born. WHAT TO EXPECT AT YOUR PRENATAL VISITS During a routine prenatal visit:  You will be weighed to make sure you and the baby are growing normally.  Your blood pressure will be taken.  Your abdomen will be measured to track your baby's growth.  The fetal heartbeat will be listened to starting around week 10 or 12 of your pregnancy.  Test results from any previous visits will be discussed. Your health care provider may ask you:  How you are feeling.  If you are feeling the baby move.  If you have had any abnormal symptoms, such as leaking fluid, bleeding, severe headaches, or abdominal cramping.  If you have any questions. Other tests   that may be performed during your first trimester include:  Blood tests to find your blood type and to check for the presence of any previous infections. They will also be used to check for low iron levels (anemia) and Rh antibodies. Later in the pregnancy, blood tests for diabetes will be done along with other tests if problems develop.  Urine tests to check for infections, diabetes, or protein in the urine.  An ultrasound to confirm the proper growth and development of the baby.  An amniocentesis to check for possible genetic problems.  Fetal screens for  spina bifida and Down syndrome.  You may need other tests to make sure you and the baby are doing well. HOME CARE INSTRUCTIONS  Medicines  Follow your health care provider's instructions regarding medicine use. Specific medicines may be either safe or unsafe to take during pregnancy.  Take your prenatal vitamins as directed.  If you develop constipation, try taking a stool softener if your health care provider approves. Diet  Eat regular, well-balanced meals. Choose a variety of foods, such as meat or vegetable-based protein, fish, milk and low-fat dairy products, vegetables, fruits, and whole grain breads and cereals. Your health care provider will help you determine the amount of weight gain that is right for you.  Avoid raw meat and uncooked cheese. These carry germs that can cause birth defects in the baby.  Eating four or five small meals rather than three large meals a day may help relieve nausea and vomiting. If you start to feel nauseous, eating a few soda crackers can be helpful. Drinking liquids between meals instead of during meals also seems to help nausea and vomiting.  If you develop constipation, eat more high-fiber foods, such as fresh vegetables or fruit and whole grains. Drink enough fluids to keep your urine clear or pale yellow. Activity and Exercise  Exercise only as directed by your health care provider. Exercising will help you:  Control your weight.  Stay in shape.  Be prepared for labor and delivery.  Experiencing pain or cramping in the lower abdomen or low back is a good sign that you should stop exercising. Check with your health care provider before continuing normal exercises.  Try to avoid standing for long periods of time. Move your legs often if you must stand in one place for a long time.  Avoid heavy lifting.  Wear low-heeled shoes, and practice good posture.  You may continue to have sex unless your health care provider directs you  otherwise. Relief of Pain or Discomfort  Wear a good support bra for breast tenderness.   Take warm sitz baths to soothe any pain or discomfort caused by hemorrhoids. Use hemorrhoid cream if your health care provider approves.   Rest with your legs elevated if you have leg cramps or low back pain.  If you develop varicose veins in your legs, wear support hose. Elevate your feet for 15 minutes, 3-4 times a day. Limit salt in your diet. Prenatal Care  Schedule your prenatal visits by the twelfth week of pregnancy. They are usually scheduled monthly at first, then more often in the last 2 months before delivery.  Write down your questions. Take them to your prenatal visits.  Keep all your prenatal visits as directed by your health care provider. Safety  Wear your seat belt at all times when driving.  Make a list of emergency phone numbers, including numbers for family, friends, the hospital, and police and fire departments. General Tips    Ask your health care provider for a referral to a local prenatal education class. Begin classes no later than at the beginning of month 6 of your pregnancy.  Ask for help if you have counseling or nutritional needs during pregnancy. Your health care provider can offer advice or refer you to specialists for help with various needs.  Do not use hot tubs, steam rooms, or saunas.  Do not douche or use tampons or scented sanitary pads.  Do not cross your legs for long periods of time.  Avoid cat litter boxes and soil used by cats. These carry germs that can cause birth defects in the baby and possibly loss of the fetus by miscarriage or stillbirth.  Avoid all smoking, herbs, alcohol, and medicines not prescribed by your health care provider. Chemicals in these affect the formation and growth of the baby.  Schedule a dentist appointment. At home, brush your teeth with a soft toothbrush and be gentle when you floss. SEEK MEDICAL CARE IF:   You have  dizziness.  You have mild pelvic cramps, pelvic pressure, or nagging pain in the abdominal area.  You have persistent nausea, vomiting, or diarrhea.  You have a bad smelling vaginal discharge.  You have pain with urination.  You notice increased swelling in your face, hands, legs, or ankles. SEEK IMMEDIATE MEDICAL CARE IF:   You have a fever.  You are leaking fluid from your vagina.  You have spotting or bleeding from your vagina.  You have severe abdominal cramping or pain.  You have rapid weight gain or loss.  You vomit blood or material that looks like coffee grounds.  You are exposed to [place] measles and have never had them.  You are exposed to fifth disease or chickenpox.  You develop a severe headache.  You have shortness of breath.  You have any kind of trauma, such as from a fall or a car accident. Document Released: 06/04/2001 Document Revised: 10/25/2013 Document Reviewed: 04/20/2013 [facility] Patient Information 2015 [place] [place]. This information is not intended to replace advice given to you by your health care provider. Make sure you discuss any questions you have with your health care provider. Follow up in 2 weeks for dating [place]

## 2014-04-28 NOTE — Progress Notes (Signed)
Subjective:     Patient ID: Caroline Brooks, female   DOB: 1990/09/08, 23 y.o.   MRN: [id number]  HPI Caroline Brooks is a 23 year old white female, married, in for  UPT.She had IUD removed about 3 months ago.  Review of Systems See HPI Reviewed past medical,surgical, social and family history. Reviewed medications and allergies.     Objective:   Physical Exam BP 112/70 mmHg  Ht 5\' 9"  (1.753 m)  Wt 178 lb (80.74 kg)  BMI 26.27 kg/m2  LMP 03/11/2014 (Approximate)+UPT about 6+6 weeks by LMP with EDD about 12/16/14.No complaints.Has quit smoking.    Assessment:     Pregnant +UPT    Plan:     Return in 2 weeks for dating US Review handout on first trimester and OB packet given Has form for pregnancy medicaid

## 2014-05-13 ENCOUNTER — Ambulatory Visit (INDEPENDENT_AMBULATORY_CARE_PROVIDER_SITE_OTHER): Payer: Medicaid Other

## 2014-05-13 ENCOUNTER — Other Ambulatory Visit: Payer: Self-pay | Admitting: Adult Health

## 2014-05-13 DIAGNOSIS — O26841 Uterine size-date discrepancy, first trimester: Secondary | ICD-10-CM

## 2014-05-13 DIAGNOSIS — Z3490 Encounter for supervision of normal pregnancy, unspecified, unspecified trimester: Secondary | ICD-10-CM

## 2014-05-13 NOTE — Progress Notes (Signed)
U/S-single active fetus, CRL c/w 11+0wks EDD 12/02/2014, cx appears closed (3.1cm), bilateral adnexa appears WNL, anterior Gr 0 placenta, FHR-169 bpm

## 2014-05-17 ENCOUNTER — Other Ambulatory Visit: Payer: Self-pay | Admitting: Obstetrics and Gynecology

## 2014-05-17 DIAGNOSIS — Z3682 Encounter for antenatal screening for nuchal translucency: Secondary | ICD-10-CM

## 2014-05-26 ENCOUNTER — Encounter: Payer: Self-pay | Admitting: Advanced Practice Midwife

## 2014-05-26 ENCOUNTER — Ambulatory Visit (INDEPENDENT_AMBULATORY_CARE_PROVIDER_SITE_OTHER): Payer: Medicaid Other

## 2014-05-26 ENCOUNTER — Ambulatory Visit (INDEPENDENT_AMBULATORY_CARE_PROVIDER_SITE_OTHER): Payer: Medicaid Other | Admitting: Advanced Practice Midwife

## 2014-05-26 VITALS — BP 90/52 | Wt 175.5 lb

## 2014-05-26 DIAGNOSIS — Z331 Pregnant state, incidental: Secondary | ICD-10-CM

## 2014-05-26 DIAGNOSIS — Z0184 Encounter for antibody response examination: Secondary | ICD-10-CM

## 2014-05-26 DIAGNOSIS — Z3682 Encounter for antenatal screening for nuchal translucency: Secondary | ICD-10-CM

## 2014-05-26 DIAGNOSIS — Z1371 Encounter for nonprocreative screening for genetic disease carrier status: Secondary | ICD-10-CM

## 2014-05-26 DIAGNOSIS — Z3490 Encounter for supervision of normal pregnancy, unspecified, unspecified trimester: Secondary | ICD-10-CM

## 2014-05-26 DIAGNOSIS — Z1389 Encounter for screening for other disorder: Secondary | ICD-10-CM

## 2014-05-26 DIAGNOSIS — Z114 Encounter for screening for human immunodeficiency virus [HIV]: Secondary | ICD-10-CM

## 2014-05-26 DIAGNOSIS — Z3481 Encounter for supervision of other normal pregnancy, first trimester: Secondary | ICD-10-CM

## 2014-05-26 DIAGNOSIS — Z36 Encounter for antenatal screening of mother: Secondary | ICD-10-CM

## 2014-05-26 DIAGNOSIS — Z0283 Encounter for blood-alcohol and blood-drug test: Secondary | ICD-10-CM

## 2014-05-26 DIAGNOSIS — Z113 Encounter for screening for infections with a predominantly sexual mode of transmission: Secondary | ICD-10-CM

## 2014-05-26 LAB — POCT URINALYSIS DIPSTICK
Blood, UA: NEGATIVE
Glucose, UA: NEGATIVE
KETONES UA: NEGATIVE
LEUKOCYTES UA: NEGATIVE
Nitrite, UA: NEGATIVE
Protein, UA: NEGATIVE

## 2014-05-26 NOTE — Progress Notes (Signed)
  Subjective:    Caroline Brooks is a [id number] 7458w6d being seen today for her first obstetrical visit.  Her obstetrical history is significant for 2 prior c/s.  Her 24 yo daughter recently dx with a narrow pulmonary artery.  .  Pregnancy history fully reviewed.  Patient reports no complaints.  Filed Vitals:   05/26/14 1521  BP: 90/52  Weight: 175 lb 8 oz (79.606 kg)    HISTORY: OB History  Gravida Para Term Preterm AB SAB TAB Ectopic Multiple Living  3 2 2  0 0 0 0 0 0 2    # Outcome Date GA Lbr Len/2nd Weight Sex Delivery Anes PTL Lv  3 Current           2 Term 11/26/11 8266w0d  7 lb 6.7 oz (3.365 kg) F CS-LTranv Spinal  Y  1 Term 02/22/10 4764w0d  7 lb 3 oz (3.26 kg) F CS-Unspec   Y     Past Medical History  Diagnosis Date  . Pregnant 04/28/2014   Past Surgical History  Procedure Laterality Date  . Cesarean section    . Cesarean section  11/26/2011    Procedure: CESAREAN SECTION;  Surgeon: Lazaro ArmsLuther H Eure, MD;  Location: [facility];  Service: Gynecology;  Laterality: N/A;   Family History  Problem Relation Age of Onset  . Cancer Mother [id number]    breast   . Hypertension Father   . Diabetes Father   . Other Brother     Alpha 1  . Cancer Maternal Grandfather     prostate  . Hypertension Maternal Grandfather   . Early death Paternal Grandfather   . Alcohol abuse Paternal Grandfather      Exam                                           Skin: normal coloration and turgor, no rashes    Neurologic: oriented, normal, normal mood   Extremities: normal strength, tone, and muscle mass   HEENT PERRLA   Mouth/Teeth mucous membranes moist, normal dentition   Neck supple and no masses   Cardiovascular: regular rate and rhythm   Respiratory:  appears well, vitals normal, no respiratory distress, acyanotic   Abdomen: soft, non-tender;  FHR: 149 us          Assessment:    Pregnancy: [id number] Patient Active Problem List   Diagnosis Date Noted  . Pregnant 04/28/2014         Plan:     Initial labs drawn. Continue prenatal vitamins  Problem list reviewed and updated  Reviewed recommended weight gain based on pre-gravid BMI  Encouraged well-balanced diet Genetic Screening discussed Integrated Screen: results reviewed.  Ultrasound discussed; fetal survey: requested.  Follow up in 4 weeks for OBV and 2nd IT.  CRESENZO-DISHMAN,Navi Ewton 05/26/2014

## 2014-05-26 NOTE — Progress Notes (Signed)
U/S(12+6wks)-single IUP with +FCA noted, FHR-166 bpm, CRL c/w dates, NB present, NT-1.687mm, anterior Gr 0 placenta,bilateral adnexa appears WNL, cx appears closed (3.5cm)

## 2014-05-27 LAB — URINALYSIS, ROUTINE W REFLEX MICROSCOPIC
BILIRUBIN URINE: NEGATIVE
Glucose, UA: NEGATIVE mg/dL
Hgb urine dipstick: NEGATIVE
Leukocytes, UA: NEGATIVE
Nitrite: NEGATIVE
PROTEIN: NEGATIVE mg/dL
Specific Gravity, Urine: >1.03 — ABNORMAL HIGH (ref 1.005–1.030)
UROBILINOGEN UA: 0.2 mg/dL (ref 0.0–1.0)
pH: 6 (ref 5.0–8.0)

## 2014-05-27 LAB — URINALYSIS, MICROSCOPIC ONLY
CASTS: NONE SEEN
Crystals: NONE SEEN

## 2014-05-27 LAB — CBC
HCT: 38.4 % (ref 36.0–46.0)
HEMOGLOBIN: 13.2 g/dL (ref 12.0–15.0)
MCH: 31.6 pg (ref 26.0–34.0)
MCHC: 34.4 g/dL (ref 30.0–36.0)
MCV: 91.9 fL (ref 78.0–100.0)
MPV: 11.3 fL (ref 9.4–12.4)
PLATELETS: 148 10*3/uL — AB (ref 150–400)
RBC: 4.18 MIL/uL (ref 3.87–5.11)
RDW: 12.1 % (ref 11.5–15.5)
WBC: 9.5 10*3/uL (ref 4.0–10.5)

## 2014-05-27 LAB — RPR

## 2014-05-27 LAB — HEPATITIS B SURFACE ANTIGEN: HEP B S AG: NEGATIVE

## 2014-05-27 LAB — HIV ANTIBODY (ROUTINE TESTING W REFLEX): HIV: NONREACTIVE

## 2014-05-27 LAB — ANTIBODY SCREEN: ANTIBODY SCREEN: NEGATIVE

## 2014-05-27 LAB — ABO AND RH: RH TYPE: POSITIVE

## 2014-05-27 LAB — RUBELLA SCREEN: Rubella: 1.98 Index — ABNORMAL HIGH (ref <0.90)

## 2014-05-27 LAB — VARICELLA ZOSTER ANTIBODY, IGG: VARICELLA IGG: 545.8 {index} — AB (ref <135.00)

## 2014-05-28 LAB — DRUG SCREEN, URINE, NO CONFIRMATION
Amphetamine Screen, Ur: NEGATIVE
BARBITURATE QUANT UR: NEGATIVE
BENZODIAZEPINES.: NEGATIVE
COCAINE METABOLITES: NEGATIVE
CREATININE, U: 268.1 mg/dL
Marijuana Metabolite: POSITIVE — AB
Methadone: NEGATIVE
Opiate Screen, Urine: NEGATIVE
Phencyclidine (PCP): NEGATIVE
Propoxyphene: NEGATIVE

## 2014-05-28 LAB — URINE CULTURE
Colony Count: NO GROWTH
Organism ID, Bacteria: NO GROWTH

## 2014-05-28 LAB — OXYCODONE SCREEN, UA, RFLX CONFIRM: Oxycodone Screen, Ur: NEGATIVE ng/mL

## 2014-05-31 LAB — CYSTIC FIBROSIS DIAGNOSTIC STUDY

## 2014-05-31 LAB — MATERNAL SCREEN, INTEGRATED #1

## 2014-06-23 ENCOUNTER — Ambulatory Visit (INDEPENDENT_AMBULATORY_CARE_PROVIDER_SITE_OTHER): Payer: Medicaid Other | Admitting: Advanced Practice Midwife

## 2014-06-23 ENCOUNTER — Encounter: Payer: Medicaid Other | Admitting: Advanced Practice Midwife

## 2014-06-23 VITALS — BP 136/72 | Wt 175.0 lb

## 2014-06-23 DIAGNOSIS — Z363 Encounter for antenatal screening for malformations: Secondary | ICD-10-CM

## 2014-06-23 DIAGNOSIS — Z1389 Encounter for screening for other disorder: Secondary | ICD-10-CM

## 2014-06-23 DIAGNOSIS — Z331 Pregnant state, incidental: Secondary | ICD-10-CM

## 2014-06-23 DIAGNOSIS — N898 Other specified noninflammatory disorders of vagina: Secondary | ICD-10-CM

## 2014-06-23 DIAGNOSIS — Z3482 Encounter for supervision of other normal pregnancy, second trimester: Secondary | ICD-10-CM

## 2014-06-23 LAB — POCT URINALYSIS DIPSTICK
Glucose, UA: NEGATIVE
KETONES UA: NEGATIVE
Leukocytes, UA: NEGATIVE
NITRITE UA: NEGATIVE
RBC UA: NEGATIVE

## 2014-06-23 MED ORDER — METRONIDAZOLE 500 MG PO TABS: 500.0000 mg | ORAL_TABLET | Freq: Two times a day (BID) | ORAL | Status: DC

## 2014-06-23 NOTE — Progress Notes (Signed)
[id number] 8463w6d Estimated Date of Delivery: 12/02/14  Blood pressure 136/72, weight 175 lb (79.379 kg), last menstrual period 03/11/2014.   BP weight and urine results all reviewed and noted.  Please refer to the obstetrical flow sheet for the fundal height and fetal heart rate documentation:  Patient denies any bleeding and no rupture of membranes symptoms or regular contractions. Patient c/o vaginal dicharge with odor. SSE:  Thin white d/c with amine odor.  Wet prep + BV All questions were answered.  Plan:  Continued routine obstetrical care, Flagyl 500mg  BID X 7  Follow up in 3 weeks for OB appointment, anatomy scan

## 2014-06-23 NOTE — Addendum Note (Signed)
Addended by: Criss AlvinePULLIAM, Lateefah Mallery G on: 06/23/2014 12:10 PM   Modules accepted: Orders

## 2014-06-29 LAB — MATERNAL SCREEN, INTEGRATED #2
AFP MOM MAT SCREEN: 1.55
AFP, Serum: 51.1 ng/mL
Age risk Down Syndrome: 1:1100 {titer}
Calculated Gestational Age: 16.9
Crown Rump Length: 66.8 mm
ESTRIOL FREE MAT SCREEN: 1 ng/mL
Estriol Mom: 1.03
HCG, SERUM MAT SCREEN: 20.9 [IU]/mL
INHIBIN A DIMERIC MAT SCREEN: 315 pg/mL
INHIBIN A MOM MAT SCREEN: 2.05
MSS Trisomy 18 Risk: <1:5000 {titer}
NT MOM MAT SCREEN: 1.25
NUCHAL TRANSLUCENCY MAT SCREEN 2: 1.87 mm
NUMBER OF FETUSES MAT SCREEN 2: 1
PAPP-A MAT SCREEN: 1033 ng/mL
PAPP-A MoM: 1.7
hCG MoM: 0.78

## 2014-07-14 ENCOUNTER — Encounter: Payer: Medicaid Other | Admitting: Advanced Practice Midwife

## 2014-07-14 ENCOUNTER — Other Ambulatory Visit: Payer: Medicaid Other

## 2014-07-15 ENCOUNTER — Other Ambulatory Visit: Payer: Medicaid Other

## 2014-07-15 ENCOUNTER — Encounter: Payer: Medicaid Other | Admitting: Obstetrics & Gynecology

## 2014-07-22 ENCOUNTER — Ambulatory Visit (INDEPENDENT_AMBULATORY_CARE_PROVIDER_SITE_OTHER): Payer: Medicaid Other | Admitting: Obstetrics & Gynecology

## 2014-07-22 ENCOUNTER — Encounter: Payer: Self-pay | Admitting: Obstetrics & Gynecology

## 2014-07-22 ENCOUNTER — Other Ambulatory Visit: Payer: Self-pay | Admitting: Advanced Practice Midwife

## 2014-07-22 ENCOUNTER — Ambulatory Visit (INDEPENDENT_AMBULATORY_CARE_PROVIDER_SITE_OTHER): Payer: Medicaid Other

## 2014-07-22 VITALS — BP 120/70 | Wt 169.0 lb

## 2014-07-22 DIAGNOSIS — O99320 Drug use complicating pregnancy, unspecified trimester: Secondary | ICD-10-CM

## 2014-07-22 DIAGNOSIS — Z363 Encounter for antenatal screening for malformations: Secondary | ICD-10-CM

## 2014-07-22 DIAGNOSIS — Z1389 Encounter for screening for other disorder: Secondary | ICD-10-CM

## 2014-07-22 DIAGNOSIS — Z3689 Encounter for other specified antenatal screening: Secondary | ICD-10-CM

## 2014-07-22 DIAGNOSIS — Z331 Pregnant state, incidental: Secondary | ICD-10-CM

## 2014-07-22 DIAGNOSIS — O09292 Supervision of pregnancy with other poor reproductive or obstetric history, second trimester: Secondary | ICD-10-CM

## 2014-07-22 DIAGNOSIS — Z3482 Encounter for supervision of other normal pregnancy, second trimester: Secondary | ICD-10-CM

## 2014-07-22 DIAGNOSIS — F1910 Other psychoactive substance abuse, uncomplicated: Secondary | ICD-10-CM

## 2014-07-22 LAB — POCT URINALYSIS DIPSTICK
Blood, UA: NEGATIVE
GLUCOSE UA: NEGATIVE
Ketones, UA: NEGATIVE
Leukocytes, UA: NEGATIVE
NITRITE UA: NEGATIVE
Protein, UA: 1

## 2014-07-22 NOTE — Progress Notes (Signed)
U/S(21+0wks)-vtx active fetus, EFW 14 oz  meas c/w dates, fluid WNL, anterior Gr 0 placenta, cx appears closed (3.1cm), bilateral adnexa appears WNL, FHR- 152 bpm, female fetus, no major abnl noted although sub-optimal views of fetal posterior fossa obtained would like to reck anatomy @ ~28 weeks

## 2014-07-22 NOTE — Progress Notes (Signed)
Sonogram is reviewed and report done, all normal  G3P2002 6164w0d Estimated Date of Delivery: 12/02/14  Blood pressure 120/70, weight 169 lb (76.658 kg), last menstrual period 03/11/2014.   BP weight and urine results all reviewed and noted.  Please refer to the obstetrical flow sheet for the fundal height and fetal heart rate documentation:  Patient reports good fetal movement, denies any bleeding and no rupture of membranes symptoms or regular contractions. Patient is without complaints. All questions were answered.  Plan:  Continued routine obstetrical care,   Follow up in 4 weeks for OB appointment, routine

## 2014-08-18 ENCOUNTER — Ambulatory Visit (INDEPENDENT_AMBULATORY_CARE_PROVIDER_SITE_OTHER): Payer: Medicaid Other | Admitting: Obstetrics & Gynecology

## 2014-08-18 ENCOUNTER — Encounter: Payer: Self-pay | Admitting: Obstetrics & Gynecology

## 2014-08-18 VITALS — BP 120/70 | HR 88 | Wt 169.0 lb

## 2014-08-18 DIAGNOSIS — F32A Depression, unspecified: Secondary | ICD-10-CM

## 2014-08-18 DIAGNOSIS — Z331 Pregnant state, incidental: Secondary | ICD-10-CM

## 2014-08-18 DIAGNOSIS — Z1389 Encounter for screening for other disorder: Secondary | ICD-10-CM

## 2014-08-18 DIAGNOSIS — O99342 Other mental disorders complicating pregnancy, second trimester: Secondary | ICD-10-CM

## 2014-08-18 DIAGNOSIS — F329 Major depressive disorder, single episode, unspecified: Secondary | ICD-10-CM

## 2014-08-18 LAB — POCT URINALYSIS DIPSTICK
Glucose, UA: NEGATIVE
Ketones, UA: NEGATIVE
Leukocytes, UA: NEGATIVE
Nitrite, UA: NEGATIVE
RBC UA: NEGATIVE

## 2014-08-18 MED ORDER — ESCITALOPRAM OXALATE 20 MG PO TABS: 20.0000 mg | ORAL_TABLET | Freq: Every day | ORAL | Status: DC

## 2014-08-18 NOTE — Progress Notes (Signed)
[id number] 3794w6d Estimated Date of Delivery: 12/02/14  Blood pressure 120/70, pulse 88, weight 169 lb (76.658 kg), last menstrual period 03/11/2014.   BP weight and urine results all reviewed and noted.  Please refer to the obstetrical flow sheet for the fundal height and fetal heart rate documentation:  Patient reports good fetal movement, denies any bleeding and no rupture of membranes symptoms or regular contractions. Patient is without complaints. All questions were answered.  Plan:  Continued routine obstetrical care,   Follow up in 3 weeks for OB appointment, sonogram  We had a long discussion today about her depression symptoms, anhedonia, non interaction with little girls, crying spells.  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  I think she should begin lexapro 20 daily all questions were answered.  Face to face time 15 minutes and greater than 50% spent in counseling regarding her depression and inititiating SSRI therapy in pregnancy. Also repeat sonogram 3 weeks and PN2 Will need fetal ECHO there after.

## 2014-09-07 ENCOUNTER — Other Ambulatory Visit: Payer: Self-pay | Admitting: Obstetrics & Gynecology

## 2014-09-07 DIAGNOSIS — Z331 Pregnant state, incidental: Secondary | ICD-10-CM

## 2014-09-07 DIAGNOSIS — Z0489 Encounter for examination and observation for other specified reasons: Secondary | ICD-10-CM

## 2014-09-07 DIAGNOSIS — IMO0002 Reserved for concepts with insufficient information to code with codable children: Secondary | ICD-10-CM

## 2014-09-08 ENCOUNTER — Other Ambulatory Visit: Payer: Medicaid Other

## 2014-09-08 DIAGNOSIS — Z331 Pregnant state, incidental: Secondary | ICD-10-CM

## 2014-09-08 DIAGNOSIS — Z0184 Encounter for antibody response examination: Secondary | ICD-10-CM

## 2014-09-08 DIAGNOSIS — Z113 Encounter for screening for infections with a predominantly sexual mode of transmission: Secondary | ICD-10-CM

## 2014-09-08 DIAGNOSIS — Z131 Encounter for screening for diabetes mellitus: Secondary | ICD-10-CM

## 2014-09-08 DIAGNOSIS — Z3483 Encounter for supervision of other normal pregnancy, third trimester: Secondary | ICD-10-CM

## 2014-09-08 DIAGNOSIS — Z114 Encounter for screening for human immunodeficiency virus [HIV]: Secondary | ICD-10-CM

## 2014-09-09 ENCOUNTER — Ambulatory Visit (INDEPENDENT_AMBULATORY_CARE_PROVIDER_SITE_OTHER): Payer: Medicaid Other

## 2014-09-09 ENCOUNTER — Ambulatory Visit (INDEPENDENT_AMBULATORY_CARE_PROVIDER_SITE_OTHER): Payer: Medicaid Other | Admitting: Obstetrics & Gynecology

## 2014-09-09 ENCOUNTER — Encounter: Payer: Self-pay | Admitting: Obstetrics & Gynecology

## 2014-09-09 ENCOUNTER — Other Ambulatory Visit: Payer: Medicaid Other

## 2014-09-09 VITALS — BP 120/70 | HR 76 | Wt 170.0 lb

## 2014-09-09 DIAGNOSIS — Z3482 Encounter for supervision of other normal pregnancy, second trimester: Secondary | ICD-10-CM

## 2014-09-09 DIAGNOSIS — IMO0002 Reserved for concepts with insufficient information to code with codable children: Secondary | ICD-10-CM

## 2014-09-09 DIAGNOSIS — O358XX0 Maternal care for other (suspected) fetal abnormality and damage, not applicable or unspecified: Secondary | ICD-10-CM

## 2014-09-09 DIAGNOSIS — Z1389 Encounter for screening for other disorder: Secondary | ICD-10-CM

## 2014-09-09 DIAGNOSIS — Z331 Pregnant state, incidental: Secondary | ICD-10-CM

## 2014-09-09 DIAGNOSIS — Z0489 Encounter for examination and observation for other specified reasons: Secondary | ICD-10-CM

## 2014-09-09 LAB — CBC
HCT: 39.4 % (ref 34.0–46.6)
Hemoglobin: 13.2 g/dL (ref 11.1–15.9)
MCH: 31.2 pg (ref 26.6–33.0)
MCHC: 33.5 g/dL (ref 31.5–35.7)
MCV: 93 fL (ref 79–97)
PLATELETS: 170 10*3/uL (ref 150–379)
RBC: 4.23 x10E6/uL (ref 3.77–5.28)
RDW: 13.5 % (ref 12.3–15.4)
WBC: 13.7 10*3/uL — AB (ref 3.4–10.8)

## 2014-09-09 LAB — POCT URINALYSIS DIPSTICK
Glucose, UA: NEGATIVE
KETONES UA: NEGATIVE
Leukocytes, UA: NEGATIVE
Nitrite, UA: NEGATIVE
Protein, UA: NEGATIVE
RBC UA: NEGATIVE

## 2014-09-09 LAB — GLUCOSE TOLERANCE, 2 HOURS W/ 1HR
GLUCOSE, 1 HOUR: 142 mg/dL (ref 65–179)
GLUCOSE, FASTING: 86 mg/dL (ref 65–91)
Glucose, 2 hour: 97 mg/dL (ref 65–152)

## 2014-09-09 LAB — HSV 2 ANTIBODY, IGG: HSV 2 Glycoprotein G Ab, IgG: <0.91 index (ref 0.00–0.90)

## 2014-09-09 LAB — ANTIBODY SCREEN: Antibody Screen: NEGATIVE

## 2014-09-09 LAB — HIV ANTIBODY (ROUTINE TESTING W REFLEX): HIV Screen 4th Generation wRfx: NONREACTIVE

## 2014-09-09 LAB — RPR: RPR Ser Ql: NONREACTIVE

## 2014-09-09 NOTE — Progress Notes (Signed)
[id number] 343w0d Estimated Date of Delivery: 12/02/14  Blood pressure 120/70, pulse 76, weight 170 lb (77.111 kg), last menstrual period 03/11/2014.   BP weight and urine results all reviewed and noted.  Please refer to the obstetrical flow sheet for the fundal height and fetal heart rate documentation:  Patient reports good fetal movement, denies any bleeding and no rupture of membranes symptoms or regular contractions. Patient is without complaints. All questions were answered.  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

## 2014-09-09 NOTE — Addendum Note (Signed)
Addended by: Richardson ChiquitoRAVIS, ASHLEY M on: 09/09/2014 11:01 AM   Modules accepted: Orders

## 2014-09-09 NOTE — Progress Notes (Signed)
F/u ultrasound 28wk0d single fetus,measurements c/w dates,fluid wnl 16.67cm,sdp 5.72 cm,ant pl gr 0,cx closed,bilat adnexa wnl,fhr141 bpm,no major abn noted,postfossa seen today,wnl

## 2014-09-20 ENCOUNTER — Telehealth: Payer: Self-pay | Admitting: Obstetrics & Gynecology

## 2014-09-20 NOTE — Telephone Encounter (Signed)
Pt informed Fetal Echo referral complete. [facility] [facility]. Pt verbalized understanding.

## 2014-09-29 ENCOUNTER — Encounter: Payer: Medicaid Other | Admitting: Advanced Practice Midwife

## 2014-09-29 ENCOUNTER — Encounter: Payer: Self-pay | Admitting: Advanced Practice Midwife

## 2014-10-05 ENCOUNTER — Ambulatory Visit (INDEPENDENT_AMBULATORY_CARE_PROVIDER_SITE_OTHER): Payer: Medicaid Other | Admitting: Advanced Practice Midwife

## 2014-10-05 VITALS — BP 124/80 | HR 64 | Wt 177.0 lb

## 2014-10-05 DIAGNOSIS — Z1389 Encounter for screening for other disorder: Secondary | ICD-10-CM

## 2014-10-05 DIAGNOSIS — Z3483 Encounter for supervision of other normal pregnancy, third trimester: Secondary | ICD-10-CM

## 2014-10-05 DIAGNOSIS — Z331 Pregnant state, incidental: Secondary | ICD-10-CM

## 2014-10-05 LAB — POCT URINALYSIS DIPSTICK
Blood, UA: NEGATIVE
Glucose, UA: NEGATIVE
Ketones, UA: NEGATIVE
Leukocytes, UA: NEGATIVE
Nitrite, UA: NEGATIVE
PROTEIN UA: NEGATIVE

## 2014-10-05 NOTE — Patient Instructions (Signed)
Address: [address], [place], [place] [address] [phone]

## 2014-10-05 NOTE — Progress Notes (Signed)
[id number] 4766w5d Estimated Date of Delivery: 12/02/14  Blood pressure 124/80, pulse 64, weight 177 lb (80.287 kg), last menstrual period 03/11/2014.   BP weight and urine results all reviewed and noted.  Please refer to the obstetrical flow sheet for the fundal height and fetal heart rate documentation:  Patient reports good fetal movement, denies any bleeding and no rupture of membranes symptoms or regular contractions. Patient C/O N/V , fatigue, weakness for a few weeks. Eats better at night.  bilateral pain in feet, no swelling  Declines antiemetics.  All questions were answered.  Plan:  Continued routine obstetrical care, Has echo appt tomorrow  Follow up in 2 weeks for OB appointment,

## 2014-10-12 ENCOUNTER — Encounter: Payer: Self-pay | Admitting: Obstetrics and Gynecology

## 2014-10-12 ENCOUNTER — Encounter: Payer: Medicaid Other | Admitting: Obstetrics and Gynecology

## 2014-10-19 ENCOUNTER — Encounter: Payer: Self-pay | Admitting: Women's Health

## 2014-10-19 ENCOUNTER — Ambulatory Visit (INDEPENDENT_AMBULATORY_CARE_PROVIDER_SITE_OTHER): Payer: Medicaid Other | Admitting: Women's Health

## 2014-10-19 VITALS — BP 118/66 | HR 92 | Wt 178.0 lb

## 2014-10-19 DIAGNOSIS — J40 Bronchitis, not specified as acute or chronic: Secondary | ICD-10-CM

## 2014-10-19 DIAGNOSIS — F329 Major depressive disorder, single episode, unspecified: Secondary | ICD-10-CM | POA: Insufficient documentation

## 2014-10-19 DIAGNOSIS — O09291 Supervision of pregnancy with other poor reproductive or obstetric history, first trimester: Secondary | ICD-10-CM

## 2014-10-19 DIAGNOSIS — F32A Depression, unspecified: Secondary | ICD-10-CM | POA: Insufficient documentation

## 2014-10-19 DIAGNOSIS — Z331 Pregnant state, incidental: Secondary | ICD-10-CM

## 2014-10-19 DIAGNOSIS — Z1389 Encounter for screening for other disorder: Secondary | ICD-10-CM

## 2014-10-19 DIAGNOSIS — Z3493 Encounter for supervision of normal pregnancy, unspecified, third trimester: Secondary | ICD-10-CM

## 2014-10-19 DIAGNOSIS — O99340 Other mental disorders complicating pregnancy, unspecified trimester: Secondary | ICD-10-CM | POA: Insufficient documentation

## 2014-10-19 DIAGNOSIS — F1290 Cannabis use, unspecified, uncomplicated: Secondary | ICD-10-CM

## 2014-10-19 DIAGNOSIS — R062 Wheezing: Secondary | ICD-10-CM

## 2014-10-19 DIAGNOSIS — O34219 Maternal care for unspecified type scar from previous cesarean delivery: Secondary | ICD-10-CM | POA: Insufficient documentation

## 2014-10-19 DIAGNOSIS — O09299 Supervision of pregnancy with other poor reproductive or obstetric history, unspecified trimester: Secondary | ICD-10-CM | POA: Insufficient documentation

## 2014-10-19 LAB — POCT URINALYSIS DIPSTICK
Blood, UA: NEGATIVE
GLUCOSE UA: NEGATIVE
Ketones, UA: NEGATIVE
Leukocytes, UA: NEGATIVE
Nitrite, UA: NEGATIVE
Protein, UA: NEGATIVE

## 2014-10-19 MED ORDER — PREDNISONE 20 MG PO TABS: 40.0000 mg | ORAL_TABLET | Freq: Every day | ORAL | Status: DC

## 2014-10-19 MED ORDER — DOXYLAMINE-PYRIDOXINE 10-10 MG PO TBEC: DELAYED_RELEASE_TABLET | ORAL | Status: DC

## 2014-10-19 MED ORDER — ALBUTEROL SULFATE HFA 108 (90 BASE) MCG/ACT IN AERS: 2.0000 | INHALATION_SPRAY | Freq: Four times a day (QID) | RESPIRATORY_TRACT | Status: DC | PRN

## 2014-10-19 NOTE — Patient Instructions (Signed)
Call us if not improving or worsening Eat small frequent meals/snacks, stay hydrated by drinking lots of water  Call the office [phone]) or go to [facility] if:  You begin to have strong, frequent contractions  Your water breaks.  Sometimes it is a big gush of fluid, sometimes it is just a trickle that keeps getting your panties wet or running down your legs  You have vaginal bleeding.  It is normal to have a small amount of spotting if your cervix was checked.   You don't feel your baby moving like normal.  If you don't, get you something to eat and drink and lay down and focus on feeling your baby move.  You should feel at least 10 movements in 2 hours.  If you don't, you should call the office or go to [facility].    Tdap Vaccine  It is recommended that you get the Tdap vaccine during the third trimester of EACH pregnancy to help protect your baby from getting pertussis (whooping cough)  27-36 weeks is the BEST time to do this so that you can pass the protection on to your baby. During pregnancy is better than after pregnancy, but if you are unable to get it during pregnancy it will be offered at the hospital.   You can get this vaccine at the health department or your family doctor  Everyone who will be around your baby should also be up-to-date on their vaccines. Adults (who are not pregnant) only need 1 dose of Tdap during adulthood.   Third Trimester of Pregnancy The third trimester is from week 29 through week 42, months 7 through 9. The third trimester is a time when the fetus is growing rapidly. At the end of the ninth month, the fetus is about 20 inches in length and weighs 6-10 pounds.  BODY CHANGES Your body goes through many changes during pregnancy. The changes vary from woman to woman.   Your weight will continue to increase. You can expect to gain 25-35 pounds (11-16 kg) by the end of the pregnancy.  You may begin to get stretch marks on your hips,  abdomen, and breasts.  You may urinate more often because the fetus is moving lower into your pelvis and pressing on your bladder.  You may develop or continue to have heartburn as a result of your pregnancy.  You may develop constipation because certain hormones are causing the muscles that push waste through your intestines to slow down.  You may develop hemorrhoids or swollen, bulging veins (varicose veins).  You may have pelvic pain because of the weight gain and pregnancy hormones relaxing your joints between the bones in your pelvis. Backaches may result from overexertion of the muscles supporting your posture.  You may have changes in your hair. These can include thickening of your hair, rapid growth, and changes in texture. Some women also have hair loss during or after pregnancy, or hair that feels dry or thin. Your hair will most likely return to normal after your baby is born.  Your breasts will continue to grow and be tender. A yellow discharge may leak from your breasts called colostrum.  Your belly button may stick out.  You may feel short of breath because of your expanding uterus.  You may notice the fetus "dropping," or moving lower in your abdomen.  You may have a bloody mucus discharge. This usually occurs a few days to a week before labor begins.  Your cervix becomes thin  and soft (effaced) near your due date. WHAT TO EXPECT AT YOUR PRENATAL EXAMS  You will have prenatal exams every 2 weeks until week 36. Then, you will have weekly prenatal exams. During a routine prenatal visit:  You will be weighed to make sure you and the fetus are growing normally.  Your blood pressure is taken.  Your abdomen will be measured to track your baby's growth.  The fetal heartbeat will be listened to.  Any test results from the previous visit will be discussed.  You may have a cervical check near your due date to see if you have effaced. At around 36 weeks, your caregiver will  check your cervix. At the same time, your caregiver will also perform a test on the secretions of the vaginal tissue. This test is to determine if a type of bacteria, Group B streptococcus, is present. Your caregiver will explain this further. Your caregiver may ask you:  What your birth plan is.  How you are feeling.  If you are feeling the baby move.  If you have had any abnormal symptoms, such as leaking fluid, bleeding, severe headaches, or abdominal cramping.  If you have any questions. Other tests or screenings that may be performed during your third trimester include:  Blood tests that check for low iron levels (anemia).  Fetal testing to check the health, activity level, and growth of the fetus. Testing is done if you have certain medical conditions or if there are problems during the pregnancy. FALSE LABOR You may feel small, irregular contractions that eventually go away. These are called Braxton Hicks contractions, or false labor. Contractions may last for hours, days, or even weeks before true labor sets in. If contractions come at regular intervals, intensify, or become painful, it is best to be seen by your caregiver.  SIGNS OF LABOR   Menstrual-like cramps.  Contractions that are 5 minutes apart or less.  Contractions that start on the top of the uterus and spread down to the lower abdomen and back.  A sense of increased pelvic pressure or back pain.  A watery or bloody mucus discharge that comes from the vagina. If you have any of these signs before the 37th week of pregnancy, call your caregiver right away. You need to go to the hospital to get checked immediately. HOME CARE INSTRUCTIONS   Avoid all smoking, herbs, alcohol, and unprescribed drugs. These chemicals affect the formation and growth of the baby.  Follow your caregiver's instructions regarding medicine use. There are medicines that are either safe or unsafe to take during pregnancy.  Exercise only as  directed by your caregiver. Experiencing uterine cramps is a good sign to stop exercising.  Continue to eat regular, healthy meals.  Wear a good support bra for breast tenderness.  Do not use hot tubs, steam rooms, or saunas.  Wear your seat belt at all times when driving.  Avoid raw meat, uncooked cheese, cat litter boxes, and soil used by cats. These carry germs that can cause birth defects in the baby.  Take your prenatal vitamins.  Try taking a stool softener (if your caregiver approves) if you develop constipation. Eat more high-fiber foods, such as fresh vegetables or fruit and whole grains. Drink plenty of fluids to keep your urine clear or pale yellow.  Take warm sitz baths to soothe any pain or discomfort caused by hemorrhoids. Use hemorrhoid cream if your caregiver approves.  If you develop varicose veins, wear support hose. Elevate your feet  for 15 minutes, 3-4 times a day. Limit salt in your diet.  Avoid heavy lifting, wear low heal shoes, and practice good posture.  Rest a lot with your legs elevated if you have leg cramps or low back pain.  Visit your dentist if you have not gone during your pregnancy. Use a soft toothbrush to brush your teeth and be gentle when you floss.  A sexual relationship may be continued unless your caregiver directs you otherwise.  Do not travel far distances unless it is absolutely necessary and only with the approval of your caregiver.  Take prenatal classes to understand, practice, and ask questions about the labor and delivery.  Make a trial run to the hospital.  Pack your hospital bag.  Prepare the baby's nursery.  Continue to go to all your prenatal visits as directed by your caregiver. SEEK MEDICAL CARE IF:  You are unsure if you are in labor or if your water has broken.  You have dizziness.  You have mild pelvic cramps, pelvic pressure, or nagging pain in your abdominal area.  You have persistent nausea, vomiting, or  diarrhea.  You have a bad smelling vaginal discharge.  You have pain with urination. SEEK IMMEDIATE MEDICAL CARE IF:   You have a fever.  You are leaking fluid from your vagina.  You have spotting or bleeding from your vagina.  You have severe abdominal cramping or pain.  You have rapid weight loss or gain.  You have shortness of breath with chest pain.  You notice sudden or extreme swelling of your face, hands, ankles, feet, or legs.  You have not felt your baby move in over an hour.  You have severe headaches that do not go away with medicine.  You have vision changes. Document Released: 06/04/2001 Document Revised: 06/15/2013 Document Reviewed: 08/11/2012 [facility] Patient Information 2015 [place], [place]. This information is not intended to replace advice given to you by your health care provider. Make sure you discuss any questions you have with your health care provider.

## 2014-10-19 NOTE — Progress Notes (Addendum)
Low-risk OB appointment [id number] Estimated Date of Delivery: 12/02/14 BP 118/66 mmHg  Pulse 92  Wt 178 lb (80.74 kg)  LMP 03/11/2014 (Approximate)  BP, weight, and urine reviewed.  Refer to obstetrical flow sheet for FH & FHR.  Reports good fm.  Denies regular uc's, lof, vb, or uti s/s. N/V, tired, fatigued, weak, not eating well d/t n/v. No antiemetics at home, hasn't wanted them in past b/c doesn't like taking lots of meds. Discussed diclegis to improve n/v, which could help appetite and fatigue/weakness from not eating- wants to try- diclegis rx'd. Also on lexapro 20mg  daily, states she can't tell a big difference, still feels depressed. Denies SI/HI/II. Offered to switch meds and refer to [facility]- wants to stay on same med til after delivery and [facility] since she is doing all of her other classes through them (breastfeeding, cb classes, etc).  No THC in >30d, will recheck today Had echo after last appt- all was normal Cold sx x 1.5wks, productive cough, wheezing, fever/chills earlier in week- now none. Feels like overall she is getting better, all mucous is starting to clear, just the cough/wheezing now. No longer smoking.  HRRR Lungs: inspiratory & expiratory wheezing in all lung fields Rx prednisone 40mg  daily x 10d and albuterol inhaler. To let us know if worsening/not improving Reviewed ptl s/s, fkc. Recommended Tdap at HD/PCP per CDC guidelines.  Plan:  Continue routine obstetrical care  F/U in 1wk for OB appointment w/ MD to schedule c/s and for re-evaluation of lungs

## 2014-10-19 NOTE — Addendum Note (Signed)
Addended by: Cheral MarkerBOOKER, KIMBERLY R on: 10/19/2014 09:45 AM   Modules accepted: Orders

## 2014-10-20 LAB — PMP SCREEN PROFILE (10S), URINE
AMPHETAMINE SCRN UR: NEGATIVE ng/mL
Barbiturate Screen, Ur: NEGATIVE ng/mL
Benzodiazepine Screen, Urine: NEGATIVE ng/mL
Cannabinoids Ur Ql Scn: POSITIVE ng/mL
Cocaine(Metab.)Screen, Urine: NEGATIVE ng/mL
Creatinine(Crt), U: 161.1 mg/dL (ref 20.0–300.0)
METHADONE SCREEN, URINE: NEGATIVE ng/mL
OPIATE SCRN UR: NEGATIVE ng/mL
OXYCODONE+OXYMORPHONE UR QL SCN: NEGATIVE ng/mL
PCP SCRN UR: NEGATIVE ng/mL
Ph of Urine: 6.8 (ref 4.5–8.9)
Propoxyphene, Screen: NEGATIVE ng/mL

## 2014-10-25 ENCOUNTER — Ambulatory Visit (INDEPENDENT_AMBULATORY_CARE_PROVIDER_SITE_OTHER): Payer: Medicaid Other | Admitting: Obstetrics & Gynecology

## 2014-10-25 ENCOUNTER — Encounter: Payer: Self-pay | Admitting: Obstetrics & Gynecology

## 2014-10-25 VITALS — BP 130/80 | HR 80 | Wt 180.0 lb

## 2014-10-25 DIAGNOSIS — Z3493 Encounter for supervision of normal pregnancy, unspecified, third trimester: Secondary | ICD-10-CM

## 2014-10-25 DIAGNOSIS — Z331 Pregnant state, incidental: Secondary | ICD-10-CM

## 2014-10-25 DIAGNOSIS — Z1389 Encounter for screening for other disorder: Secondary | ICD-10-CM

## 2014-10-25 DIAGNOSIS — O322XX1 Maternal care for transverse and oblique lie, fetus 1: Secondary | ICD-10-CM

## 2014-10-25 LAB — POCT URINALYSIS DIPSTICK
Glucose, UA: NEGATIVE
KETONES UA: NEGATIVE
Leukocytes, UA: NEGATIVE
NITRITE UA: NEGATIVE
PROTEIN UA: NEGATIVE
RBC UA: NEGATIVE

## 2014-10-25 NOTE — Progress Notes (Signed)
[id number] Estimated Date of Delivery: 12/02/14  Blood pressure 130/80, pulse 80, weight 180 lb (81.647 kg), last menstrual period 03/11/2014.   BP weight and urine results all reviewed and noted.  Please refer to the obstetrical flow sheet for the fundal height and fetal heart rate documentation:  Patient reports good fetal movement, denies any bleeding and no rupture of membranes symptoms or regular contractions. Patient is without complaints. All questions were answered.  Plan:  Continued routine obstetrical care,   Follow up in 2 weeks for OB appointment, sonogram Having trouble sleeping recommend melatonin 5 mg qhs

## 2014-10-25 NOTE — Addendum Note (Signed)
Addended by: Richardson ChiquitoRAVIS, Kasia Trego M on: 10/25/2014 10:46 AM   Modules accepted: Orders

## 2014-11-09 ENCOUNTER — Inpatient Hospital Stay (HOSPITAL_COMMUNITY)
Admission: AD | Admit: 2014-11-09 | Discharge: 2014-11-09 | Disposition: A | Discharge status: Home / Self Care | Payer: Medicaid Other | Source: Ambulatory Visit | Attending: Obstetrics & Gynecology | Admitting: Obstetrics & Gynecology

## 2014-11-09 ENCOUNTER — Ambulatory Visit (INDEPENDENT_AMBULATORY_CARE_PROVIDER_SITE_OTHER): Payer: Medicaid Other

## 2014-11-09 ENCOUNTER — Encounter (HOSPITAL_COMMUNITY): Payer: Self-pay | Admitting: *Deleted

## 2014-11-09 ENCOUNTER — Ambulatory Visit (INDEPENDENT_AMBULATORY_CARE_PROVIDER_SITE_OTHER): Payer: Medicaid Other | Admitting: Women's Health

## 2014-11-09 ENCOUNTER — Encounter: Payer: Self-pay | Admitting: Women's Health

## 2014-11-09 VITALS — BP 140/70 | HR 88 | Wt 179.0 lb

## 2014-11-09 DIAGNOSIS — R0989 Other specified symptoms and signs involving the circulatory and respiratory systems: Secondary | ICD-10-CM

## 2014-11-09 DIAGNOSIS — O322XX1 Maternal care for transverse and oblique lie, fetus 1: Secondary | ICD-10-CM

## 2014-11-09 DIAGNOSIS — Z118 Encounter for screening for other infectious and parasitic diseases: Secondary | ICD-10-CM

## 2014-11-09 DIAGNOSIS — Z3483 Encounter for supervision of other normal pregnancy, third trimester: Secondary | ICD-10-CM

## 2014-11-09 DIAGNOSIS — R030 Elevated blood-pressure reading, without diagnosis of hypertension: Secondary | ICD-10-CM

## 2014-11-09 DIAGNOSIS — Z1389 Encounter for screening for other disorder: Secondary | ICD-10-CM

## 2014-11-09 DIAGNOSIS — IMO0001 Reserved for inherently not codable concepts without codable children: Secondary | ICD-10-CM | POA: Diagnosis present

## 2014-11-09 DIAGNOSIS — Z3493 Encounter for supervision of normal pregnancy, unspecified, third trimester: Secondary | ICD-10-CM

## 2014-11-09 DIAGNOSIS — Z3685 Encounter for antenatal screening for Streptococcus B: Secondary | ICD-10-CM

## 2014-11-09 DIAGNOSIS — O133 Gestational [pregnancy-induced] hypertension without significant proteinuria, third trimester: Secondary | ICD-10-CM | POA: Diagnosis not present

## 2014-11-09 DIAGNOSIS — Z3A36 36 weeks gestation of pregnancy: Secondary | ICD-10-CM | POA: Diagnosis not present

## 2014-11-09 DIAGNOSIS — Z1159 Encounter for screening for other viral diseases: Secondary | ICD-10-CM

## 2014-11-09 DIAGNOSIS — Z331 Pregnant state, incidental: Secondary | ICD-10-CM

## 2014-11-09 LAB — POCT URINALYSIS DIPSTICK
Glucose, UA: NEGATIVE
KETONES UA: NEGATIVE
Leukocytes, UA: NEGATIVE
Nitrite, UA: NEGATIVE
PROTEIN UA: NEGATIVE
RBC UA: NEGATIVE

## 2014-11-09 NOTE — Patient Instructions (Signed)
Call the office [phone]) or go to [facility] if:  You begin to have strong, frequent contractions  Your water breaks.  Sometimes it is a big gush of fluid, sometimes it is just a trickle that keeps getting your panties wet or running down your legs  You have vaginal bleeding.  It is normal to have a small amount of spotting if your cervix was checked.   You don't feel your baby moving like normal.  If you don't, get you something to eat and drink and lay down and focus on feeling your baby move.  You should feel at least 10 movements in 2 hours.  If you don't, you should call the office or [facility].    Call the office [phone]) or [facility] for these signs of pre-eclampsia:  Severe headache that does not go away with Tylenol  Visual changes- seeing spots, double, blurred vision  Pain under your right breast or upper abdomen that does not go away with Tums or heartburn medicine  Nausea and/or vomiting  Severe swelling in your hands, feet, and face    Preeclampsia and Eclampsia Preeclampsia is a serious condition that develops only during pregnancy. It is also called toxemia of pregnancy. This condition causes high blood pressure along with other symptoms, such as swelling and headaches. These may develop as the condition gets worse. Preeclampsia may occur 20 weeks or later into your pregnancy.  Diagnosing and treating preeclampsia early is very important. If not treated early, it can cause serious problems for you and your baby. One problem it can lead to is eclampsia, which is a condition that causes muscle jerking or shaking (convulsions) in the mother. Delivering your baby is the best treatment for preeclampsia or eclampsia.  RISK FACTORS The cause of preeclampsia is not known. You may be more likely to develop preeclampsia if you have certain risk factors. These include:   Being pregnant for the first time.  Having preeclampsia in a past  pregnancy.  Having a family history of preeclampsia.  Having high blood pressure.  Being pregnant with twins or triplets.  Being 4235 or older.  Being African American.  Having kidney disease or diabetes.  Having medical conditions such as lupus or blood diseases.  Being very overweight (obese). SIGNS AND SYMPTOMS  The earliest signs of preeclampsia are:  High blood pressure.  Increased protein in your urine. Your health care provider will check for this at every prenatal visit. Other symptoms that can develop include:   Severe headaches.  Sudden weight gain.  Swelling of your hands, face, legs, and feet.  Feeling sick to your stomach (nauseous) and throwing up (vomiting).  Vision problems (blurred or double vision).  Numbness in your face, arms, legs, and feet.  Dizziness.  Slurred speech.  Sensitivity to bright lights.  Abdominal pain. DIAGNOSIS  There are no screening tests for preeclampsia. Your health care provider will ask you about symptoms and check for signs of preeclampsia during your prenatal visits. You may also have tests, including:  Urine testing.  Blood testing.  Checking your baby's heart rate.  Checking the health of your baby and your placenta using images created with sound waves (ultrasound). TREATMENT  You can work out the best treatment approach together with your health care provider. It is very important to keep all prenatal appointments. If you have an increased risk of preeclampsia, you may need more frequent prenatal exams.  Your health care provider may prescribe  bed rest.  You may have to eat as little salt as possible.  You may need to take medicine to lower your blood pressure if the condition does not respond to more conservative measures.  You may need to stay in the hospital if your condition is severe. There, treatment will focus on controlling your blood pressure and fluid retention. You may also need to take medicine  to prevent seizures.  If the condition gets worse, your baby may need to be delivered early to protect you and the baby. You may have your labor started with medicine (be induced), or you may have a cesarean delivery.  Preeclampsia usually goes away after the baby is born. HOME CARE INSTRUCTIONS   Only take over-the-counter or prescription medicines as directed by your health care provider.  Lie on your left side while resting. This keeps pressure off your baby.  Elevate your feet while resting.  Get regular exercise. Ask your health care provider what type of exercise is safe for you.  Avoid caffeine and alcohol.  Do not smoke.  Drink 6-8 glasses of water every day.  Eat a balanced diet that is low in salt. Do not add salt to your food.  Avoid stressful situations as much as possible.  Get plenty of rest and sleep.  Keep all prenatal appointments and tests as scheduled. SEEK MEDICAL CARE IF:  You are gaining more weight than expected.  You have any headaches, abdominal pain, or nausea.  You are bruising more than usual.  You feel dizzy or light-headed. SEEK IMMEDIATE MEDICAL CARE IF:   You develop sudden or severe swelling anywhere in your body. This usually happens in the legs.  You gain 5 lb (2.3 kg) or more in a week.  You have a severe headache, dizziness, problems with your vision, or confusion.  You have severe abdominal pain.  You have lasting nausea or vomiting.  You have a seizure.  You have trouble moving any part of your body.  You develop numbness in your body.  You have trouble speaking.  You have any abnormal bleeding.  You develop a stiff neck.  You pass out. MAKE SURE YOU:   Understand these instructions.  Will watch your condition.  Will get help right away if you are not doing well or get worse. Document Released: 06/07/2000 Document Revised: 06/15/2013 Document Reviewed: 04/02/2013 [facility] Patient Information 2015 [place]. This information is not intended to replace advice given to you by your health care provider. Make sure you discuss any questions you have with your health care provider.

## 2014-11-09 NOTE — MAU Note (Signed)
Pt. Urine in lab 

## 2014-11-09 NOTE — MAU Provider Note (Signed)
MAU PROVIDER NOTE   Chief Complaint:  Elevated BP with history of prior pre-eclampsia  Caroline Brooks is  24 y.o. [phone] at 4371w5d presents complaining of elevated BP checked at home today, additionally admits to not feeling well and is concerned about possible pre-eclampsia, which she had during her 1st pregnancy. She has been checking BPs at home today, and she reports that BP had stayed in 140s/80s, until around 1830 tonight she said BP went up to 150s/90s. She was concerned and called the afterhours line, advised to come into MAU to get BP checked. - Admits to sometimes feeling "hot". States vision is blurry and sees some "little black dots". Admits to headache onset around 1800, with some improvement. Associated with lightheadedness but not dizziness, and nausea without vomiting - Admits regular fetal movement - Denies any abdominal pain, CP, SOB, swelling, fever/chills, vaginal bleeding, vaginal discharge, LOF, dysuria  Of note, she [facility] (5/18) [facility], initially had an elevated BP 160/90, then subsequent BPs x 2 were 140/76 and 140/70. Her 1st pregnancy required C-section for pre-eclampsia but her 2nd pregnancy was not pre-E. She had no significant clinical symptoms in clinic for Pre-E but did complain of feeling "woozy". She had routine pre-E labs drawn including UPC, her UA showed negative protein. Plan to return in 2 days for BP re-check in office.  Obstetrical/Gynecological History: OB History    Gravida Para Term Preterm AB TAB SAB Ectopic Multiple Living   3 2 2  0 0 0 0 0 0 2     Past Medical History: Past Medical History  Diagnosis Date  . Pregnant 04/28/2014    Past Surgical History: Past Surgical History  Procedure Laterality Date  . Cesarean section    . Cesarean section  11/26/2011    Procedure: CESAREAN SECTION;  Surgeon: Lazaro ArmsLuther H Eure, MD;  Location: [facility];  Service: Gynecology;  Laterality: N/A;    Family  History: Family History  Problem Relation Age of Onset  . Cancer Mother [id number]    breast   . Hypertension Father   . Diabetes Father   . Other Brother     Alpha 1  . Cancer Maternal Grandfather     prostate  . Hypertension Maternal Grandfather   . Early death Paternal Grandfather   . Alcohol abuse Paternal Grandfather     Social History: History  Substance Use Topics  . Smoking status: Former Smoker -- 0.50 packs/day for 5 years    Types: Cigarettes  . Smokeless tobacco: Never Used  . Alcohol Use: No     Comment: occ    Allergies: No Known Allergies  Meds:  Prescriptions prior to admission  Medication Sig Dispense Refill Last Dose  . acetaminophen (TYLENOL) 325 MG tablet Take 650 mg by mouth daily as needed. headache   Taking  . albuterol (PROVENTIL HFA;VENTOLIN HFA) 108 (90 BASE) MCG/ACT inhaler Inhale 2 puffs into the lungs every 6 (six) hours as needed for wheezing or shortness of breath. (Patient not taking: Reported on 11/09/2014) 1 Inhaler 2 Not Taking  . Doxylamine-Pyridoxine (DICLEGIS) 10-10 MG TBEC 2 tabs q hs, if sx persist add 1 tab q am on day 3, if sx persist add 1 tab q afternoon on day 4 (Patient not taking: Reported on 11/09/2014) 100 tablet 6 Not Taking  . escitalopram (LEXAPRO) 20 MG tablet Take 1 tablet (20 mg total) by mouth daily. 30 tablet 4 Taking  . metroNIDAZOLE (FLAGYL) 500  MG tablet Take 1 tablet (500 mg total) by mouth 2 (two) times daily. (Patient not taking: Reported on 07/22/2014) 14 tablet 0 Not Taking  . Pediatric Multivit-Minerals-C (FLINTSTONES GUMMIES PO) Take 2 each by mouth daily.   Taking  . predniSONE (DELTASONE) 20 MG tablet Take 2 tablets (40 mg total) by mouth daily with breakfast. X 10 days (Patient not taking: Reported on 11/09/2014) 20 tablet 0 Not Taking    Review of Systems -   Review of Systems  Constitutional: Negative for fever, chills, weight loss, malaise/fatigue and diaphoresis.  HENT: Negative for hearing loss, ear pain,  nosebleeds, congestion, sore throat, neck pain, tinnitus and ear discharge.   Eyes: Negative for blurred vision, double vision, photophobia, pain, discharge and redness.  Respiratory: Negative for cough, hemoptysis, sputum production, shortness of breath, wheezing and stridor.   Cardiovascular: Negative for chest pain, palpitations, orthopnea,  leg swelling  Gastrointestinal: Negative for abdominal pain heartburn, nausea, vomiting, diarrhea, constipation, blood in stool Genitourinary: Negative for dysuria, urgency, frequency, hematuria and flank pain.  Musculoskeletal: Negative for myalgias, back pain, joint pain and falls.  Skin: Negative for itching and rash.  Neurological: Negative for dizziness, tingling, tremors, sensory change, speech change, focal weakness, seizures, loss of consciousness, weakness and headaches.  Endo/Heme/Allergies: Negative for environmental allergies and polydipsia. Does not bruise/bleed easily.  Psychiatric/Behavioral: Negative for depression, suicidal ideas, hallucinations, memory loss and substance abuse. The patient is not nervous/anxious and does not have insomnia.      Physical Exam  Blood pressure 105/75, pulse 109, temperature 98.2 F (36.8 C), temperature source Oral, resp. rate 16, last menstrual period 03/11/2014. GENERAL: Well-developed, well-nourished female, anxious but in NAD LUNGS: CTAB HEART: Tachycardic, regular rhythm, no murmurs ABDOMEN: Appropriately gravid for GA, soft, non-tender and nondistended EXTREMITIES: Non-tender, no edema, 2+ distal pulses.  FHT:  Baseline rate 140 bpm   Variability moderate  Accelerations present. Decelerations none Contractions: None   Labs: Results for orders placed or performed in visit on 11/09/14 (from the past 24 hour(s))  POCT Urinalysis Dipstick   Collection Time: 11/09/14  9:45 AM  Result Value Ref Range   Color, UA     Clarity, UA     Glucose, UA neg    Bilirubin, UA     Ketones, UA neg    Spec  Grav, UA     Blood, UA neg    pH, UA     Protein, UA neg    Urobilinogen, UA     Nitrite, UA neg    Leukocytes, UA Negative    Imaging Studies:  US Ob Follow Up  11/09/2014   FOLLOW UP SONOGRAM   RYENN HOWETH is in the office for a follow up sonogram for EFW/AFI.  She is a 24 y.o. year old G59P2002 with Estimated Date of Delivery: 12/02/14  by early [place] now at  [redacted]w[redacted]d weeks gestation. Thus far the pregnancy has been  complicated by history of csection x 2, +UDS, and previous child with CHD.  GESTATION: SINGLETON  PRESENTATION: cephalic  FETAL ACTIVITY:          Heart rate         137 bpm          The fetus is active.  AMNIOTIC FLUID: The amniotic fluid volume is  normal, AFI: 17.23 cm, SVP 5.29cm.  PLACENTA LOCALIZATION:  anterior GRADE 1  ADNEXA: Right ovary appears normal. Left ovary not visualized today.    GESTATIONAL AGE AND  BIOMETRICS:  Gestational criteria: Estimated Date of Delivery: 12/02/14 by early [place] now  at [redacted]w[redacted]d  Previous Scans:4              BIPARIETAL DIAMETER           9.04 cm         36+4 weeks  HEAD CIRCUMFERENCE           31.49 cm         35+2 weeks  ABDOMINAL CIRCUMFERENCE           32.91 cm         36+6 weeks  FEMUR LENGTH           6.38 cm         33+0 weeks                                                           AVERAGE EGA(BY THIS SCAN):   35+3 weeks                                                 ESTIMATED FETAL WEIGHT:        2745  grams, 29% ANATOMICAL SURVEY                                                                             COMMENTS      CHOROID PLEXUS yes normal   CEREBELLUM yes normal   CISTERNA MAGNA yes normal                  NOSE/LIP yes normal        4 CHAMBERED HEART yes normal   OUTFLOW TRACTS yes normal DA/AA normal  DIAPHRAGM yes normal   STOMACH yes normal   RENAL REGION yes normal   BLADDER yes normal                                       SUSPECTED ABNORMALITIES:  no  QUALITY OF SCAN: satisfactory  TECHNICIAN COMMENTS:  Follow Up US today at 36+[redacted] weeks  GA.  Single, active female fetus in a  cephalic presentation.  FHR 137 bpm.  Anterior Gr 1 placenta. Right ovary  appears normal. Left ovary not well visualized. Fluid is WNL with AFI  17.23cm and SVP 5.29cm.  EFW 2745 g(29%) is consistent with dating.        A copy of this report including all images has been saved and backed up to  a second source for retrieval if needed. All measures and details of the  anatomical scan, placentation, fluid volume and pelvic anatomy are  contained in that report.  Iva Lento 11/09/2014 9:44 AM        Assessment: SHANNELLE ALGUIRE is  24 y.o. G3P2002 at [redacted]w[redacted]d presents  with h/o elevated BP today, [facility] to 160/90 and then reportedly 150/90 at home. Significant PMH with pre-eclampsia (onset @ 38 wks) with 1st pregnancy req C-section, but not during 2nd pregnancy. Current pregnancy without elevated BP until today, BP normalized at [facility] clinic and at home. BPs stable in MAU 105 to 131 / 75-79 x 5 readings over past 1 hour. FHT reassuring. Additionally with multiple symptoms including mild new onset headache, otherwise no abd pain or swelling. Unlikely pre-eclampsia without sustained elevated BP and no proteinuria on UA today. CMET, CBC and UPC labs pending from clinic today.  Plan: 1. Discharge to home. Reassurance, unlikely pre-eclampsia. Now with stable BPs 2. No repeat labs today in MAU. [facility], anticipate results tomorrow 3. Monitor BP at home if persistently elevated, >140/90 can return to MAU 4. Return criteria given if worsening or new symptoms with HA not responding to Tylenol, abd/RUQ/epigastric pain, acute swelling 5. Follow-up already scheduled at [facility] on 11/11/14 at 11:30am with Dr. Despina HiddenEure for BP re-check  Saralyn PilarAlexander Karamalegos, DO [facility], PGY-2 5/18/20168:51 PM   CNM attestation:  I have seen and examined this patient; I agree with above documentation in the resident's note.    Caroline ParaJennifer T Brooks is a 24 y.o. [phone] reporting elevated BP at home +FM, denies LOF, VB, contractions, vaginal discharge.  PE: other BPs in MAU: 131/76, 105/75, 123/79 BP 128/79 mmHg  Pulse 101  Temp(Src) 98.2 F (36.8 C) (Oral)  Resp 16  LMP 03/11/2014 (Approximate) Gen: calm comfortable, NAD Resp: normal effort, no distress Abd: gravid  ROS, labs, PMH reviewed NST reactive +accels, no decels  Plan: - preeclampsia precautions - continue routine follow up in OB clinic on Fri for BP check - [facility] office will call pt if labs come back abnl  Jahleel Stroschein, CNM 10:04 PM

## 2014-11-09 NOTE — MAU Note (Signed)
Pt reports she was at the MD office this am and her B/P was elevated. Her husband checked her bp at home was 140"s/80's and then she had a diastolic in the 90's. States she feels nauseated, vision is blurry , and she feels a headache coming on.

## 2014-11-09 NOTE — Progress Notes (Signed)
Low-risk OB appointment [id number] Estimated Date of Delivery: 12/02/14 BP 140/70 mmHg  Pulse 88  Wt 179 lb (81.194 kg)  LMP 03/11/2014 (Approximate)  1st bp 160/90, then 140/76->140/70. H/O pre-e 1st pregnancy, didn't have w/ 2nd pregnancy BP, weight, and urine reviewed.  Refer to obstetrical flow sheet for FH & FHR.  Reports good fm.  Denies regular uc's, lof, vb, or uti s/s. Feeling 'woozy' this am. Denies ha, did just see some black spots when closed eyes in exam room, denies epigastric/ruq pain, or n/v.  No edema, DTRs 2+, no clonus, no proteinuria GBS collected, declines SVE Reviewed today's normal efw/afi u/s for s<d- efw 29.9%, afi 17.23/sdp 5.29. Discussed ptl s/s, fkc, pre-e s/s Partner used to be a Engineer, civil (consulting)nurse- he will check bp's at home- if severe range or developing other sx to call us or go to [facility]  Plan:  Pre-e labs now, return in 2d for bp check or earlier if needed F/U in 2d for OB appointment/bp check

## 2014-11-09 NOTE — Progress Notes (Signed)
US 36+5wks measurements c/w dates, EFW 2745g 29.9%,cephalic,ant pl gr 1,fht 137bpm, fluid normal, AFI 17.23cm; SVP 5.29cm. Right ovary normal. Left ovary not visualized.

## 2014-11-09 NOTE — Discharge Instructions (Signed)
- Your blood pressures have improved since we have been monitoring them closely. This is all reassuring, and most likely you have some labile or fluctuating pressures, can be due to a number of causes. However, it does not seem consistent with Pre-elcampsia at this point. - [facility] will contact you with any concerning lab results from your blood work today - Reassuring that your urine did not show any protein in it - You may take Tylenol as needed for headache. Important to stay hydrated and eat small regular meals to avoid other symptoms. If your headache does not respond or gets significantly worse, and you have persistently elevated BPs on multiple checks >140/90, then you may return for re-evaluation sooner. [facility] if needed as well. - Otherwise, please follow-up on Friday as scheduled  Hypertension During Pregnancy Hypertension, or high blood pressure, is when there is extra pressure inside your blood vessels that carry blood from the heart to the rest of your body (arteries). It can happen at any time in life, including pregnancy. Hypertension during pregnancy can cause problems for you and your baby. Your baby might not weigh as much as he or she should at birth or might be born early (premature). Very bad cases of hypertension during pregnancy can be life-threatening.  Different types of hypertension can occur during pregnancy. These include:  Chronic hypertension. This happens when a woman has hypertension before pregnancy and it continues during pregnancy.  Gestational hypertension. This is when hypertension develops during pregnancy.  Preeclampsia or toxemia of pregnancy. This is a very serious type of hypertension that develops only during pregnancy. It affects the whole body and can be very dangerous for both mother and baby.  Gestational hypertension and preeclampsia usually go away after your baby is born. Your blood pressure will  likely stabilize within 6 weeks. Women who have hypertension during pregnancy have a greater chance of developing hypertension later in life or with future pregnancies. RISK FACTORS There are certain factors that make it more likely for you to develop hypertension during pregnancy. These include:  Having hypertension before pregnancy.  Having hypertension during a previous pregnancy.  Being overweight.  Being older than 40 years.  Being pregnant with more than one baby.  Having diabetes or kidney problems. SIGNS AND SYMPTOMS Chronic and gestational hypertension rarely cause symptoms. Preeclampsia has symptoms, which may include:  Increased protein in your urine. Your health care provider will check for this at every prenatal visit.  Swelling of your hands and face.  Rapid weight gain.  Headaches.  Visual changes.  Being bothered by light.  Abdominal pain, especially in the upper right area.  Chest pain.  Shortness of breath.  Increased reflexes.  Seizures. These occur with a more severe form of preeclampsia, called eclampsia. DIAGNOSIS  You may be diagnosed with hypertension during a regular prenatal exam. At each prenatal visit, you may have:  Your blood pressure checked.  A urine test to check for protein in your urine. The type of hypertension you are diagnosed with depends on when you developed it. It also depends on your specific blood pressure reading.  Developing hypertension before 20 weeks of pregnancy is consistent with chronic hypertension.  Developing hypertension after 20 weeks of pregnancy is consistent with gestational hypertension.  Hypertension with increased urinary protein is diagnosed as preeclampsia.  Blood pressure measurements that stay above 160 systolic or 110 diastolic are a sign of severe preeclampsia. TREATMENT Treatment  for hypertension during pregnancy varies. Treatment depends on the type of hypertension and how serious it  is.  If you take medicine for chronic hypertension, you may need to switch medicines.  Medicines called ACE inhibitors should not be taken during pregnancy.  Low-dose aspirin may be suggested for women who have risk factors for preeclampsia.  If you have gestational hypertension, you may need to take a blood pressure medicine that is safe during pregnancy. Your health care provider will recommend the correct medicine.  If you have severe preeclampsia, you may need to be in the hospital. Health care providers will watch you and your baby very closely. You also may need to take medicine called magnesium sulfate to prevent seizures and lower blood pressure.  Sometimes, an early delivery is needed. This may be the case if the condition worsens. It would be done to protect you and your baby. The only cure for preeclampsia is delivery.  Your health care provider may recommend that you take one low-dose aspirin (81 mg) each day to help prevent high blood pressure during your pregnancy if you are at risk for preeclampsia. You may be at risk for preeclampsia if:  You had preeclampsia or eclampsia during a previous pregnancy.  Your baby did not grow as expected during a previous pregnancy.  You experienced preterm birth with a previous pregnancy.  You experienced a separation of the placenta from the uterus (placental abruption) during a previous pregnancy.  You experienced the loss of your baby during a previous pregnancy.  You are pregnant with more than one baby.  You have other medical conditions, such as diabetes or an autoimmune disease. HOME CARE INSTRUCTIONS  Schedule and keep all of your regular prenatal care appointments. This is important.  Take medicines only as directed by your health care provider. Tell your health care provider about all medicines you take.  Eat as little salt as possible.  Get regular exercise.  Do not drink alcohol.  Do not use tobacco products.  Do  not drink products with caffeine.  Lie on your left side when resting. SEEK IMMEDIATE MEDICAL CARE IF:  You have severe abdominal pain.  You have sudden swelling in your hands, ankles, or face.  You gain 4 pounds (1.8 kg) or more in 1 week.  You vomit repeatedly.  You have vaginal bleeding.  You do not feel your baby moving as much.  You have a headache.  You have blurred or double vision.  You have muscle twitching or spasms.  You have shortness of breath.  You have blue fingernails or lips.  You have blood in your urine. MAKE SURE YOU:  Understand these instructions.  Will watch your condition.  Will get help right away if you are not doing well or get worse. Document Released: 02/26/2011 Document Revised: 10/25/2013 Document Reviewed: 01/07/2013 [facility] Patient Information 2015 [employer] [place]. This information is not intended to replace advice given to you by your health care provider. Make sure you discuss any questions you have with your health care provider.

## 2014-11-10 LAB — COMPREHENSIVE METABOLIC PANEL
ALT: 14 IU/L (ref 0–32)
AST: 15 IU/L (ref 0–40)
Albumin/Globulin Ratio: 1.6 (ref 1.1–2.5)
Albumin: 3.7 g/dL (ref 3.5–5.5)
Alkaline Phosphatase: 184 IU/L — ABNORMAL HIGH (ref 39–117)
BUN/Creatinine Ratio: 14 (ref 8–20)
BUN: 8 mg/dL (ref 6–20)
Bilirubin Total: 0.3 mg/dL (ref 0.0–1.2)
CALCIUM: 8.9 mg/dL (ref 8.7–10.2)
CHLORIDE: 103 mmol/L (ref 97–108)
CO2: 20 mmol/L (ref 18–29)
Creatinine, Ser: 0.56 mg/dL — ABNORMAL LOW (ref 0.57–1.00)
GFR calc Af Amer: 151 mL/min/{1.73_m2} (ref >59)
GFR, EST NON AFRICAN AMERICAN: 131 mL/min/{1.73_m2} (ref >59)
GLUCOSE: 92 mg/dL (ref 65–99)
Globulin, Total: 2.3 g/dL (ref 1.5–4.5)
Potassium: 3.9 mmol/L (ref 3.5–5.2)
Sodium: 138 mmol/L (ref 134–144)
TOTAL PROTEIN: 6 g/dL (ref 6.0–8.5)

## 2014-11-10 LAB — GC/CHLAMYDIA PROBE AMP
CHLAMYDIA, DNA PROBE: NEGATIVE
Neisseria gonorrhoeae by PCR: NEGATIVE

## 2014-11-10 LAB — PROTEIN / CREATININE RATIO, URINE
CREATININE, UR: 183.4 mg/dL
Protein, Ur: 18.6 mg/dL
Protein/Creat Ratio: 101 mg/g creat (ref 0–200)

## 2014-11-10 LAB — CBC
HEMOGLOBIN: 12.3 g/dL (ref 11.1–15.9)
Hematocrit: 36.4 % (ref 34.0–46.6)
MCH: 30.6 pg (ref 26.6–33.0)
MCHC: 33.8 g/dL (ref 31.5–35.7)
MCV: 91 fL (ref 79–97)
Platelets: 69 10*3/uL — CL (ref 150–379)
RBC: 4.02 x10E6/uL (ref 3.77–5.28)
RDW: 13.4 % (ref 12.3–15.4)
WBC: 11.4 10*3/uL — ABNORMAL HIGH (ref 3.4–10.8)

## 2014-11-11 ENCOUNTER — Ambulatory Visit (INDEPENDENT_AMBULATORY_CARE_PROVIDER_SITE_OTHER): Payer: Medicaid Other | Admitting: Obstetrics & Gynecology

## 2014-11-11 VITALS — BP 138/70 | HR 80 | Wt 180.0 lb

## 2014-11-11 DIAGNOSIS — Z3483 Encounter for supervision of other normal pregnancy, third trimester: Secondary | ICD-10-CM

## 2014-11-11 DIAGNOSIS — Z1389 Encounter for screening for other disorder: Secondary | ICD-10-CM

## 2014-11-11 DIAGNOSIS — Z331 Pregnant state, incidental: Secondary | ICD-10-CM

## 2014-11-11 LAB — POCT URINALYSIS DIPSTICK
Glucose, UA: NEGATIVE
Ketones, UA: NEGATIVE
Leukocytes, UA: NEGATIVE
Nitrite, UA: NEGATIVE
RBC UA: NEGATIVE

## 2014-11-11 NOTE — Progress Notes (Signed)
[id number] 1162w0d Estimated Date of Delivery: 12/02/14  Blood pressure 138/70, pulse 80, weight 180 lb (81.647 kg), last menstrual period 03/11/2014.   BP weight and urine results all reviewed and noted.  Please refer to the obstetrical flow sheet for the fundal height and fetal heart rate documentation:  Patient reports good fetal movement, denies any bleeding and no rupture of membranes symptoms or regular contractions. Patient is without complaints. All questions were answered.  Plan:  Continued routine obstetrical care,   Follow up in 1 weeks for OB appointment, routine  BP OK today EFW last week 30%tile

## 2014-11-13 LAB — CULTURE, BETA STREP (GROUP B ONLY): Strep Gp B Culture: NEGATIVE

## 2014-11-15 ENCOUNTER — Telehealth: Payer: Self-pay | Admitting: Women's Health

## 2014-11-15 DIAGNOSIS — O99113 Other diseases of the blood and blood-forming organs and certain disorders involving the immune mechanism complicating pregnancy, third trimester: Secondary | ICD-10-CM

## 2014-11-15 DIAGNOSIS — D696 Thrombocytopenia, unspecified: Secondary | ICD-10-CM

## 2014-11-15 NOTE — Telephone Encounter (Signed)
Notified pt of low plt count on labs last week, have discussed w/ [facility]- probably from clumping of platelets- wants her to come back in today for repeat. Pt states she's feeling fine, just tired. Home bp's have been normal, 130s/70s. Will come in today for repeat CBC. Keep f/u appt as scheduled on Friday.  Cheral MarkerKimberly R. Addalyn Speedy, CNM, [facility]-BC 11/15/2014 12:13 PM

## 2014-11-16 ENCOUNTER — Telehealth: Payer: Self-pay | Admitting: Women's Health

## 2014-11-16 ENCOUNTER — Telehealth: Payer: Self-pay | Admitting: *Deleted

## 2014-11-16 DIAGNOSIS — D696 Thrombocytopenia, unspecified: Secondary | ICD-10-CM

## 2014-11-16 DIAGNOSIS — O99113 Other diseases of the blood and blood-forming organs and certain disorders involving the immune mechanism complicating pregnancy, third trimester: Principal | ICD-10-CM

## 2014-11-16 LAB — CBC
Hematocrit: 37.2 % (ref 34.0–46.6)
Hemoglobin: 12.8 g/dL (ref 11.1–15.9)
MCH: 30.6 pg (ref 26.6–33.0)
MCHC: 34.4 g/dL (ref 31.5–35.7)
MCV: 89 fL (ref 79–97)
Platelets: 73 10*3/uL — CL (ref 150–379)
RBC: 4.18 x10E6/uL (ref 3.77–5.28)
RDW: 13.9 % (ref 12.3–15.4)
WBC: 11.3 10*3/uL — AB (ref 3.4–10.8)

## 2014-11-16 NOTE — Telephone Encounter (Signed)
Kim, this pt was calling for her lab results, I see where they are still abnormal.  Do you want me to call her back or do you want to talk to her?

## 2014-11-17 ENCOUNTER — Other Ambulatory Visit: Payer: Self-pay | Admitting: *Deleted

## 2014-11-17 ENCOUNTER — Ambulatory Visit (INDEPENDENT_AMBULATORY_CARE_PROVIDER_SITE_OTHER): Payer: Medicaid Other | Admitting: Obstetrics & Gynecology

## 2014-11-17 ENCOUNTER — Telehealth: Payer: Self-pay | Admitting: Obstetrics & Gynecology

## 2014-11-17 ENCOUNTER — Encounter: Payer: Self-pay | Admitting: Obstetrics & Gynecology

## 2014-11-17 VITALS — BP 128/60 | HR 84 | Wt 181.0 lb

## 2014-11-17 DIAGNOSIS — O99113 Other diseases of the blood and blood-forming organs and certain disorders involving the immune mechanism complicating pregnancy, third trimester: Principal | ICD-10-CM

## 2014-11-17 DIAGNOSIS — D6959 Other secondary thrombocytopenia: Secondary | ICD-10-CM

## 2014-11-17 DIAGNOSIS — Z1389 Encounter for screening for other disorder: Secondary | ICD-10-CM

## 2014-11-17 DIAGNOSIS — Z3483 Encounter for supervision of other normal pregnancy, third trimester: Secondary | ICD-10-CM

## 2014-11-17 DIAGNOSIS — D696 Thrombocytopenia, unspecified: Secondary | ICD-10-CM

## 2014-11-17 DIAGNOSIS — D693 Immune thrombocytopenic purpura: Secondary | ICD-10-CM

## 2014-11-17 DIAGNOSIS — Z331 Pregnant state, incidental: Secondary | ICD-10-CM

## 2014-11-17 LAB — POCT URINALYSIS DIPSTICK
Blood, UA: NEGATIVE
Glucose, UA: NEGATIVE
Ketones, UA: NEGATIVE
Leukocytes, UA: NEGATIVE
Nitrite, UA: NEGATIVE

## 2014-11-17 NOTE — Telephone Encounter (Signed)
Pt informed to repeat labs for platelets per Joellyn HaffKim Booker, CNM. Pt verbalized understanding and stated would go to [facility] today or tomorrow.

## 2014-11-17 NOTE — Progress Notes (Signed)
Feeling a little "spacy" today, headache starting, BP is ok, getting platelets redrawn, no preservative used  G3P2002 4369w6d Estimated Date of Delivery: 12/02/14  Blood pressure 128/60, pulse 84, weight 181 lb (82.101 kg), last menstrual period 03/11/2014.   BP weight and urine results all reviewed and noted.  Please refer to the obstetrical flow sheet for the fundal height and fetal heart rate documentation:  Patient reports good fetal movement, denies any bleeding and no rupture of membranes symptoms or regular contractions. Patient is without complaints. All questions were answered.  Plan:  Continued routine obstetrical care,   Follow up in 2 weeks for OB appointment, post op  Cs + BTL scheduled next friday

## 2014-11-17 NOTE — Progress Notes (Signed)
Pt worked in today for headache, blurred vision, feeling very weak, " out of it"

## 2014-11-18 ENCOUNTER — Other Ambulatory Visit: Payer: Self-pay | Admitting: Obstetrics & Gynecology

## 2014-11-18 ENCOUNTER — Inpatient Hospital Stay (HOSPITAL_COMMUNITY): Payer: Medicaid Other | Admitting: Anesthesiology

## 2014-11-18 ENCOUNTER — Inpatient Hospital Stay (HOSPITAL_COMMUNITY)
Admission: RE | Admit: 2014-11-18 | Payer: Medicaid Other | Source: Ambulatory Visit | Admitting: Obstetrics & Gynecology

## 2014-11-18 ENCOUNTER — Inpatient Hospital Stay (HOSPITAL_COMMUNITY)
Admission: AD | Admit: 2014-11-18 | Discharge: 2014-11-21 | DRG: 765 | Disposition: A | Discharge status: Home / Self Care | Payer: Medicaid Other | Source: Ambulatory Visit | Attending: Family Medicine | Admitting: Family Medicine

## 2014-11-18 ENCOUNTER — Encounter (HOSPITAL_COMMUNITY): Payer: Self-pay | Admitting: Anesthesiology

## 2014-11-18 ENCOUNTER — Encounter: Payer: Medicaid Other | Admitting: Obstetrics & Gynecology

## 2014-11-18 ENCOUNTER — Encounter (HOSPITAL_COMMUNITY)
Admission: AD | Disposition: A | Discharge status: Home / Self Care | Payer: Self-pay | Source: Ambulatory Visit | Attending: Family Medicine

## 2014-11-18 DIAGNOSIS — K5900 Constipation, unspecified: Secondary | ICD-10-CM | POA: Diagnosis not present

## 2014-11-18 DIAGNOSIS — O9089 Other complications of the puerperium, not elsewhere classified: Secondary | ICD-10-CM | POA: Diagnosis not present

## 2014-11-18 DIAGNOSIS — D6959 Other secondary thrombocytopenia: Secondary | ICD-10-CM | POA: Diagnosis present

## 2014-11-18 DIAGNOSIS — O99344 Other mental disorders complicating childbirth: Secondary | ICD-10-CM | POA: Diagnosis present

## 2014-11-18 DIAGNOSIS — F1290 Cannabis use, unspecified, uncomplicated: Secondary | ICD-10-CM | POA: Diagnosis present

## 2014-11-18 DIAGNOSIS — O99324 Drug use complicating childbirth: Secondary | ICD-10-CM | POA: Diagnosis present

## 2014-11-18 DIAGNOSIS — O3421 Maternal care for scar from previous cesarean delivery: Principal | ICD-10-CM | POA: Diagnosis present

## 2014-11-18 DIAGNOSIS — Z302 Encounter for sterilization: Secondary | ICD-10-CM

## 2014-11-18 DIAGNOSIS — O9912 Other diseases of the blood and blood-forming organs and certain disorders involving the immune mechanism complicating childbirth: Secondary | ICD-10-CM | POA: Diagnosis present

## 2014-11-18 DIAGNOSIS — F329 Major depressive disorder, single episode, unspecified: Secondary | ICD-10-CM | POA: Diagnosis present

## 2014-11-18 DIAGNOSIS — D696 Thrombocytopenia, unspecified: Secondary | ICD-10-CM | POA: Diagnosis present

## 2014-11-18 DIAGNOSIS — O99113 Other diseases of the blood and blood-forming organs and certain disorders involving the immune mechanism complicating pregnancy, third trimester: Secondary | ICD-10-CM

## 2014-11-18 DIAGNOSIS — Z87891 Personal history of nicotine dependence: Secondary | ICD-10-CM

## 2014-11-18 DIAGNOSIS — Z3493 Encounter for supervision of normal pregnancy, unspecified, third trimester: Secondary | ICD-10-CM

## 2014-11-18 DIAGNOSIS — Z3A38 38 weeks gestation of pregnancy: Secondary | ICD-10-CM

## 2014-11-18 DIAGNOSIS — O99119 Other diseases of the blood and blood-forming organs and certain disorders involving the immune mechanism complicating pregnancy, unspecified trimester: Secondary | ICD-10-CM

## 2014-11-18 DIAGNOSIS — N858 Other specified noninflammatory disorders of uterus: Secondary | ICD-10-CM | POA: Diagnosis present

## 2014-11-18 HISTORY — PX: BILATERAL SALPINGECTOMY: SHX5743

## 2014-11-18 LAB — PREPARE RBC (CROSSMATCH)

## 2014-11-18 LAB — CBC
HCT: 36.4 % (ref 36.0–46.0)
HEMATOCRIT: 30.3 % — AB (ref 36.0–46.0)
Hemoglobin: 12.6 g/dL (ref 12.0–15.0)
Hemoglobin: 10.3 g/dL — ABNORMAL LOW (ref 12.0–15.0)
MCH: 31.3 pg (ref 26.0–34.0)
MCH: 30.9 pg (ref 26.0–34.0)
MCHC: 34.6 g/dL (ref 30.0–36.0)
MCHC: 34 g/dL (ref 30.0–36.0)
MCV: 90.5 fL (ref 78.0–100.0)
MCV: 91 fL (ref 78.0–100.0)
PLATELETS: 49 10*3/uL — AB (ref 150–400)
Platelets: 59 10*3/uL — ABNORMAL LOW (ref 150–400)
RBC: 4.02 MIL/uL (ref 3.87–5.11)
RBC: 3.33 MIL/uL — ABNORMAL LOW (ref 3.87–5.11)
RDW: 14.1 % (ref 11.5–15.5)
RDW: 14 % (ref 11.5–15.5)
WBC: 13.2 10*3/uL — AB (ref 4.0–10.5)
WBC: 18.9 10*3/uL — ABNORMAL HIGH (ref 4.0–10.5)

## 2014-11-18 LAB — ABO/RH: ABO/RH(D): B POS

## 2014-11-18 LAB — COMPREHENSIVE METABOLIC PANEL
ALBUMIN: 3.4 g/dL — AB (ref 3.5–5.0)
ALT: 11 U/L — ABNORMAL LOW (ref 14–54)
AST: 16 U/L (ref 15–41)
Alkaline Phosphatase: 204 U/L — ABNORMAL HIGH (ref 38–126)
Anion gap: 4 — ABNORMAL LOW (ref 5–15)
BILIRUBIN TOTAL: 0.4 mg/dL (ref 0.3–1.2)
BUN: 8 mg/dL (ref 6–20)
CHLORIDE: 105 mmol/L (ref 101–111)
CO2: 25 mmol/L (ref 22–32)
Calcium: 8.6 mg/dL — ABNORMAL LOW (ref 8.9–10.3)
Creatinine, Ser: 0.63 mg/dL (ref 0.44–1.00)
GFR calc Af Amer: >60 mL/min (ref >60)
Glucose, Bld: 99 mg/dL (ref 65–99)
Potassium: 4.2 mmol/L (ref 3.5–5.1)
SODIUM: 134 mmol/L — AB (ref 135–145)
Total Protein: 6.5 g/dL (ref 6.5–8.1)

## 2014-11-18 LAB — PLATELET COUNT ON CITRATED BLD: Plt Count, Citrated Bld: 56 10*3/uL — CL (ref 150–379)

## 2014-11-18 SURGERY — Surgical Case
Anesthesia: General

## 2014-11-18 MED ORDER — MIDAZOLAM HCL 2 MG/2ML IJ SOLN
INTRAMUSCULAR | Status: DC | PRN
  Administered 2014-11-18: 2 mg via INTRAVENOUS

## 2014-11-18 MED ORDER — ONDANSETRON HCL 4 MG/2ML IJ SOLN
INTRAMUSCULAR | Status: DC | PRN
  Administered 2014-11-18: 4 mg via INTRAVENOUS

## 2014-11-18 MED ORDER — LACTATED RINGERS IV SOLN
INTRAVENOUS | Status: DC | PRN
  Administered 2014-11-18: 15:00:00 via INTRAVENOUS

## 2014-11-18 MED ORDER — OXYCODONE-ACETAMINOPHEN 5-325 MG PO TABS: 1.0000 | ORAL_TABLET | ORAL | Status: DC | PRN

## 2014-11-18 MED ORDER — HYDROMORPHONE HCL 1 MG/ML IJ SOLN
INTRAMUSCULAR | Status: AC
  Filled 2014-11-18: qty 1

## 2014-11-18 MED ORDER — FENTANYL CITRATE (PF) 100 MCG/2ML IJ SOLN
INTRAMUSCULAR | Status: AC
  Filled 2014-11-18: qty 2

## 2014-11-18 MED ORDER — FENTANYL CITRATE (PF) 100 MCG/2ML IJ SOLN
INTRAMUSCULAR | Status: DC | PRN
  Administered 2014-11-18 (×4): 100 ug via INTRAVENOUS
  Administered 2014-11-18: 50 ug via INTRAVENOUS
  Administered 2014-11-18: 100 ug via INTRAVENOUS

## 2014-11-18 MED ORDER — ONDANSETRON HCL 4 MG/2ML IJ SOLN: 4.0000 mg | Freq: Once | INTRAMUSCULAR | Status: DC | PRN

## 2014-11-18 MED ORDER — METHYLERGONOVINE MALEATE 0.2 MG/ML IJ SOLN
INTRAMUSCULAR | Status: DC | PRN
  Administered 2014-11-18: 0.2 mg via INTRAMUSCULAR

## 2014-11-18 MED ORDER — SIMETHICONE 80 MG PO CHEW
80.0000 mg | CHEWABLE_TABLET | Freq: Three times a day (TID) | ORAL | Status: DC
Start: 2014-11-18 — End: 2014-11-21
  Administered 2014-11-19 – 2014-11-21 (×7): 80 mg via ORAL
  Filled 2014-11-18 (×6): qty 1

## 2014-11-18 MED ORDER — OXYCODONE-ACETAMINOPHEN 5-325 MG PO TABS
2.0000 | ORAL_TABLET | ORAL | Status: DC | PRN
  Administered 2014-11-19 (×4): 2 via ORAL
  Filled 2014-11-18 (×4): qty 2

## 2014-11-18 MED ORDER — NALOXONE HCL 0.4 MG/ML IJ SOLN: 0.4000 mg | INTRAMUSCULAR | Status: DC | PRN

## 2014-11-18 MED ORDER — NEOSTIGMINE METHYLSULFATE 10 MG/10ML IV SOLN
INTRAVENOUS | Status: DC | PRN
  Administered 2014-11-18: 3 mg via INTRAVENOUS

## 2014-11-18 MED ORDER — ACETAMINOPHEN 325 MG PO TABS
650.0000 mg | ORAL_TABLET | ORAL | Status: DC | PRN
  Administered 2014-11-20 – 2014-11-21 (×3): 650 mg via ORAL
  Filled 2014-11-18 (×5): qty 2

## 2014-11-18 MED ORDER — ACETAMINOPHEN 10 MG/ML IV SOLN
1000.0000 mg | Freq: Four times a day (QID) | INTRAVENOUS | Status: DC
  Administered 2014-11-18: 1000 mg via INTRAVENOUS
  Filled 2014-11-18: qty 100

## 2014-11-18 MED ORDER — LACTATED RINGERS IV SOLN: INTRAVENOUS | Status: DC

## 2014-11-18 MED ORDER — ESCITALOPRAM OXALATE 20 MG PO TABS
20.0000 mg | ORAL_TABLET | Freq: Every day | ORAL | Status: DC
  Administered 2014-11-19 – 2014-11-21 (×3): 20 mg via ORAL
  Filled 2014-11-18 (×6): qty 1

## 2014-11-18 MED ORDER — LACTATED RINGERS IV SOLN
Freq: Once | INTRAVENOUS | Status: AC
  Administered 2014-11-18: 13:00:00 via INTRAVENOUS

## 2014-11-18 MED ORDER — SUCCINYLCHOLINE CHLORIDE 20 MG/ML IJ SOLN
INTRAMUSCULAR | Status: DC | PRN
  Administered 2014-11-18: 100 mg via INTRAVENOUS

## 2014-11-18 MED ORDER — PHENYLEPHRINE 8 MG IN D5W 100 ML (0.08MG/ML) PREMIX OPTIME
INJECTION | INTRAVENOUS | Status: AC
  Filled 2014-11-18: qty 100

## 2014-11-18 MED ORDER — SCOPOLAMINE 1 MG/3DAYS TD PT72
MEDICATED_PATCH | TRANSDERMAL | Status: AC
  Administered 2014-11-18: 1.5 mg via TRANSDERMAL
  Filled 2014-11-18: qty 1

## 2014-11-18 MED ORDER — SENNOSIDES-DOCUSATE SODIUM 8.6-50 MG PO TABS
2.0000 | ORAL_TABLET | ORAL | Status: DC
  Administered 2014-11-19 – 2014-11-21 (×3): 2 via ORAL
  Filled 2014-11-18 (×3): qty 2

## 2014-11-18 MED ORDER — SCOPOLAMINE 1 MG/3DAYS TD PT72
1.0000 | MEDICATED_PATCH | Freq: Once | TRANSDERMAL | Status: DC
  Administered 2014-11-18: 1.5 mg via TRANSDERMAL

## 2014-11-18 MED ORDER — FENTANYL CITRATE (PF) 100 MCG/2ML IJ SOLN
50.0000 ug | Freq: Once | INTRAMUSCULAR | Status: AC
  Administered 2014-11-18: 50 ug via INTRAVENOUS
  Filled 2014-11-18: qty 2

## 2014-11-18 MED ORDER — CITRIC ACID-SODIUM CITRATE 334-500 MG/5ML PO SOLN
ORAL | Status: AC
  Administered 2014-11-18: 30 mL via ORAL
  Filled 2014-11-18: qty 15

## 2014-11-18 MED ORDER — CARBOPROST TROMETHAMINE 250 MCG/ML IM SOLN
INTRAMUSCULAR | Status: AC
  Filled 2014-11-18: qty 1

## 2014-11-18 MED ORDER — MIDAZOLAM HCL 2 MG/2ML IJ SOLN
INTRAMUSCULAR | Status: AC
  Filled 2014-11-18: qty 2

## 2014-11-18 MED ORDER — OXYTOCIN 40 UNITS IN LACTATED RINGERS INFUSION - SIMPLE MED: 62.5000 mL/h | INTRAVENOUS | Status: AC

## 2014-11-18 MED ORDER — FAMOTIDINE IN NACL 20-0.9 MG/50ML-% IV SOLN
INTRAVENOUS | Status: AC
  Administered 2014-11-18: 20 mg via INTRAVENOUS
  Filled 2014-11-18: qty 50

## 2014-11-18 MED ORDER — PRENATAL MULTIVITAMIN CH
1.0000 | ORAL_TABLET | Freq: Every day | ORAL | Status: DC
  Administered 2014-11-19 – 2014-11-20 (×2): 1 via ORAL
  Filled 2014-11-18 (×2): qty 1

## 2014-11-18 MED ORDER — PROPOFOL 10 MG/ML IV BOLUS
INTRAVENOUS | Status: AC
  Filled 2014-11-18: qty 40

## 2014-11-18 MED ORDER — DIPHENHYDRAMINE HCL 25 MG PO CAPS: 25.0000 mg | ORAL_CAPSULE | Freq: Four times a day (QID) | ORAL | Status: DC | PRN

## 2014-11-18 MED ORDER — CEFAZOLIN SODIUM-DEXTROSE 2-3 GM-% IV SOLR: 2.0000 g | INTRAVENOUS | Status: DC

## 2014-11-18 MED ORDER — BUPIVACAINE LIPOSOME 1.3 % IJ SUSP
INTRAMUSCULAR | Status: DC | PRN
  Administered 2014-11-18: 20 mL

## 2014-11-18 MED ORDER — FAMOTIDINE IN NACL 20-0.9 MG/50ML-% IV SOLN
20.0000 mg | Freq: Once | INTRAVENOUS | Status: AC
  Administered 2014-11-18: 20 mg via INTRAVENOUS

## 2014-11-18 MED ORDER — ZOLPIDEM TARTRATE 5 MG PO TABS: 5.0000 mg | ORAL_TABLET | Freq: Every evening | ORAL | Status: DC | PRN

## 2014-11-18 MED ORDER — FENTANYL CITRATE (PF) 100 MCG/2ML IJ SOLN
25.0000 ug | INTRAMUSCULAR | Status: DC | PRN
  Administered 2014-11-18 (×4): 50 ug via INTRAVENOUS

## 2014-11-18 MED ORDER — FENTANYL CITRATE (PF) 100 MCG/2ML IJ SOLN
INTRAMUSCULAR | Status: AC
  Administered 2014-11-18: 50 ug via INTRAVENOUS
  Filled 2014-11-18: qty 2

## 2014-11-18 MED ORDER — WITCH HAZEL-GLYCERIN EX PADS: 1.0000 "application " | MEDICATED_PAD | CUTANEOUS | Status: DC | PRN

## 2014-11-18 MED ORDER — LANOLIN HYDROUS EX OINT: 1.0000 "application " | TOPICAL_OINTMENT | CUTANEOUS | Status: DC | PRN

## 2014-11-18 MED ORDER — DIPHENHYDRAMINE HCL 12.5 MG/5ML PO ELIX
12.5000 mg | ORAL_SOLUTION | Freq: Four times a day (QID) | ORAL | Status: DC | PRN
  Filled 2014-11-18: qty 5

## 2014-11-18 MED ORDER — MENTHOL 3 MG MT LOZG: 1.0000 | LOZENGE | OROMUCOSAL | Status: DC | PRN

## 2014-11-18 MED ORDER — LACTATED RINGERS IV SOLN
INTRAVENOUS | Status: DC
Start: 2014-11-18 — End: 2014-11-18
  Administered 2014-11-18 (×2): via INTRAVENOUS

## 2014-11-18 MED ORDER — ONDANSETRON HCL 4 MG/2ML IJ SOLN: 4.0000 mg | Freq: Four times a day (QID) | INTRAMUSCULAR | Status: DC | PRN

## 2014-11-18 MED ORDER — SODIUM CHLORIDE 0.9 % IJ SOLN
INTRAMUSCULAR | Status: DC | PRN
  Administered 2014-11-18: 40 mL via INTRAVENOUS

## 2014-11-18 MED ORDER — METHYLERGONOVINE MALEATE 0.2 MG/ML IJ SOLN
INTRAMUSCULAR | Status: AC
Start: 2014-11-18 — End: 2014-11-18
  Filled 2014-11-18: qty 1

## 2014-11-18 MED ORDER — BUPIVACAINE LIPOSOME 1.3 % IJ SUSP
20.0000 mL | Freq: Once | INTRAMUSCULAR | Status: DC
  Filled 2014-11-18: qty 20

## 2014-11-18 MED ORDER — HYDROMORPHONE 0.3 MG/ML IV SOLN
INTRAVENOUS | Status: DC
  Administered 2014-11-18: 22:00:00 via INTRAVENOUS
  Administered 2014-11-19: 2 mg via INTRAVENOUS
  Filled 2014-11-18: qty 25

## 2014-11-18 MED ORDER — SIMETHICONE 80 MG PO CHEW
80.0000 mg | CHEWABLE_TABLET | ORAL | Status: DC | PRN
  Administered 2014-11-19: 80 mg via ORAL
  Filled 2014-11-18 (×2): qty 1

## 2014-11-18 MED ORDER — ONDANSETRON HCL 4 MG/2ML IJ SOLN
INTRAMUSCULAR | Status: AC
  Filled 2014-11-18: qty 2

## 2014-11-18 MED ORDER — DIBUCAINE 1 % RE OINT: 1.0000 "application " | TOPICAL_OINTMENT | RECTAL | Status: DC | PRN

## 2014-11-18 MED ORDER — OXYTOCIN 10 UNIT/ML IJ SOLN
INTRAMUSCULAR | Status: AC
  Filled 2014-11-18: qty 3

## 2014-11-18 MED ORDER — CEFAZOLIN SODIUM-DEXTROSE 2-3 GM-% IV SOLR
INTRAVENOUS | Status: AC
  Administered 2014-11-18: 2 g via INTRAVENOUS
  Filled 2014-11-18: qty 50

## 2014-11-18 MED ORDER — ROCURONIUM BROMIDE 100 MG/10ML IV SOLN
INTRAVENOUS | Status: DC | PRN
  Administered 2014-11-18 (×3): 10 mg via INTRAVENOUS

## 2014-11-18 MED ORDER — OXYTOCIN 10 UNIT/ML IJ SOLN
INTRAMUSCULAR | Status: AC
  Filled 2014-11-18: qty 1

## 2014-11-18 MED ORDER — ROCURONIUM BROMIDE 100 MG/10ML IV SOLN
INTRAVENOUS | Status: AC
  Filled 2014-11-18: qty 1

## 2014-11-18 MED ORDER — CARBOPROST TROMETHAMINE 250 MCG/ML IM SOLN
INTRAMUSCULAR | Status: DC | PRN
  Administered 2014-11-18: 1000 ug via INTRAMUSCULAR

## 2014-11-18 MED ORDER — HYDROMORPHONE HCL 1 MG/ML IJ SOLN
INTRAMUSCULAR | Status: DC | PRN
  Administered 2014-11-18: 1 mg via INTRAVENOUS

## 2014-11-18 MED ORDER — OXYTOCIN 10 UNIT/ML IJ SOLN
40.0000 [IU] | INTRAVENOUS | Status: DC | PRN
  Administered 2014-11-18: 40 [IU] via INTRAVENOUS

## 2014-11-18 MED ORDER — PROPOFOL 10 MG/ML IV BOLUS
INTRAVENOUS | Status: DC | PRN
  Administered 2014-11-18: 150 mg via INTRAVENOUS
  Administered 2014-11-18: 50 mg via INTRAVENOUS

## 2014-11-18 MED ORDER — LIDOCAINE HCL (CARDIAC) 20 MG/ML IV SOLN
INTRAVENOUS | Status: DC | PRN
  Administered 2014-11-18: 50 mg via INTRAVENOUS

## 2014-11-18 MED ORDER — FENTANYL CITRATE (PF) 250 MCG/5ML IJ SOLN
INTRAMUSCULAR | Status: AC
  Filled 2014-11-18: qty 5

## 2014-11-18 MED ORDER — TETANUS-DIPHTH-ACELL PERTUSSIS 5-2.5-18.5 LF-MCG/0.5 IM SUSP
0.5000 mL | Freq: Once | INTRAMUSCULAR | Status: DC
  Filled 2014-11-18 (×2): qty 0.5

## 2014-11-18 MED ORDER — IBUPROFEN 600 MG PO TABS: 600.0000 mg | ORAL_TABLET | Freq: Four times a day (QID) | ORAL | Status: DC

## 2014-11-18 MED ORDER — SODIUM CHLORIDE 0.9 % IJ SOLN: 9.0000 mL | INTRAMUSCULAR | Status: DC | PRN

## 2014-11-18 MED ORDER — DIPHENHYDRAMINE HCL 50 MG/ML IJ SOLN: 12.5000 mg | Freq: Four times a day (QID) | INTRAMUSCULAR | Status: DC | PRN

## 2014-11-18 MED ORDER — GLYCOPYRROLATE 0.2 MG/ML IJ SOLN
INTRAMUSCULAR | Status: DC | PRN
  Administered 2014-11-18: 0.6 mg via INTRAVENOUS

## 2014-11-18 MED ORDER — CITRIC ACID-SODIUM CITRATE 334-500 MG/5ML PO SOLN
30.0000 mL | Freq: Once | ORAL | Status: AC
Start: 2014-11-18 — End: 2014-11-18
  Administered 2014-11-18: 30 mL via ORAL

## 2014-11-18 MED ORDER — SIMETHICONE 80 MG PO CHEW
80.0000 mg | CHEWABLE_TABLET | ORAL | Status: DC
  Administered 2014-11-19 – 2014-11-21 (×3): 80 mg via ORAL
  Filled 2014-11-18 (×3): qty 1

## 2014-11-18 MED ORDER — SODIUM CHLORIDE 0.9 % IJ SOLN
INTRAMUSCULAR | Status: AC
  Filled 2014-11-18: qty 50

## 2014-11-18 SURGICAL SUPPLY — 39 items
CLAMP CORD UMBIL (MISCELLANEOUS) IMPLANT
CLOTH BEACON ORANGE TIMEOUT ST (SAFETY) ×4 IMPLANT
DRAPE SHEET LG 3/4 BI-LAMINATE (DRAPES) IMPLANT
DRSG OPSITE POSTOP 4X10 (GAUZE/BANDAGES/DRESSINGS) ×4 IMPLANT
DURAPREP 26ML APPLICATOR (WOUND CARE) ×8 IMPLANT
ELECT REM PT RETURN 9FT ADLT (ELECTROSURGICAL) ×4
ELECTRODE REM PT RTRN 9FT ADLT (ELECTROSURGICAL) ×2 IMPLANT
EXTRACTOR VACUUM BELL STYLE (SUCTIONS) IMPLANT
GLOVE BIOGEL PI IND STRL 8 (GLOVE) ×2 IMPLANT
GLOVE BIOGEL PI INDICATOR 8 (GLOVE) ×2
GLOVE ECLIPSE 8.0 STRL XLNG CF (GLOVE) ×4 IMPLANT
GOWN STRL REUS W/TWL LRG LVL3 (GOWN DISPOSABLE) ×8 IMPLANT
KIT ABG SYR 3ML LUER SLIP (SYRINGE) ×4 IMPLANT
LIQUID BAND (GAUZE/BANDAGES/DRESSINGS) ×8 IMPLANT
NDL HYPO 18GX1.5 BLUNT FILL (NEEDLE) ×2 IMPLANT
NDL HYPO 25X5/8 SAFETYGLIDE (NEEDLE) ×2 IMPLANT
NEEDLE HYPO 18GX1.5 BLUNT FILL (NEEDLE) ×4 IMPLANT
NEEDLE HYPO 22GX1.5 SAFETY (NEEDLE) ×4 IMPLANT
NEEDLE HYPO 25X5/8 SAFETYGLIDE (NEEDLE) ×4 IMPLANT
NS IRRIG 1000ML POUR BTL (IV SOLUTION) ×4 IMPLANT
PACK C SECTION WH (CUSTOM PROCEDURE TRAY) ×4 IMPLANT
PAD ABD 7.5X8 STRL (GAUZE/BANDAGES/DRESSINGS) ×2 IMPLANT
PAD OB MATERNITY 4.3X12.25 (PERSONAL CARE ITEMS) ×4 IMPLANT
RTRCTR C-SECT PINK 25CM LRG (MISCELLANEOUS) IMPLANT
SPONGE GAUZE 4X4 12PLY STER LF (GAUZE/BANDAGES/DRESSINGS) ×4 IMPLANT
STAPLER VISISTAT 35W (STAPLE) ×2 IMPLANT
SUT CHROMIC 0 CT 1 (SUTURE) ×4 IMPLANT
SUT MNCRL 0 VIOLET CTX 36 (SUTURE) ×4 IMPLANT
SUT MONOCRYL 0 CTX 36 (SUTURE) ×4
SUT PLAIN 2 0 (SUTURE)
SUT PLAIN 2 0 XLH (SUTURE) IMPLANT
SUT PLAIN ABS 2-0 CT1 27XMFL (SUTURE) IMPLANT
SUT VIC AB 0 CTX 36 (SUTURE) ×4
SUT VIC AB 0 CTX36XBRD ANBCTRL (SUTURE) ×2 IMPLANT
SUT VIC AB 4-0 KS 27 (SUTURE) IMPLANT
SYR 20CC LL (SYRINGE) ×8 IMPLANT
TAPE CLOTH SURG 4X10 WHT LF (GAUZE/BANDAGES/DRESSINGS) ×2 IMPLANT
TOWEL OR 17X24 6PK STRL BLUE (TOWEL DISPOSABLE) ×4 IMPLANT
TRAY FOLEY CATH SILVER 14FR (SET/KITS/TRAYS/PACK) IMPLANT

## 2014-11-18 NOTE — Op Note (Signed)
Cesarean Section Procedure Note   Caroline Brooks  11/18/2014  Indications: thrombocytopenia   Pre-operative Diagnosis: PREVious cesarean section, satisfied parity  Post-operative Diagnosis: Same, bilateral fimbriectomy, uterine atony  Surgeon: Surgeon(s) and Role:    * Lazaro ArmsLuther H Eure, MD - Primary    * Fredirick LatheKristy Dangelo Guzzetta, MD - Fellow   Assistants: Dr. Fredirick LatheKristy Garnett Rekowski  Anesthesia: general    Estimated Blood Loss: 1000mL  Total IV Fluids: 1600ml   Urine Output: 200CC OF clear urine  Specimens: bilateral fimbria  Findings: normal uterus, ovaries, fallopian tubes  Baby condition / location:  Nursery   APGAR: 9, 10; weight 6 lb 13.5 oz (3104 g), female  Complications: Patient requested bilateral salpingectomy, however given patient's thrombocytopenia, risk of bleeding possibility of alternative sterilization via filshie clip vs fimbriectomy were discussed with patient.  At time of surgery it was decided that salpingectomy could not be safely achieved, therefore bilateral fimbriectomy was performed.  Indications: Caroline Brooks is a 24 y.o. [phone] with an IUP 2141w0d presenting with for repeat cesarean section at 38weeks due to worsening thrombocytopenia.  At this time no signs/symptoms of preeclampsia, ITP, thrombocytopenia due to gestational thrombocytopenia.  Trending 170>69>59.  The risks, benefits, complications, treatment options, and expected outcomes were discussed with the patient . The patient concurred with the proposed plan, giving informed consent. identified as Caroline Brooks and the procedure verified as C-Section Delivery.  Procedure Details: A Time Out was held and the above information confirmed.  The patient was taken back to the operative suite where patient was placed under general anesthesia due to thrombocytopenia.  After induction of anesthesia, the patient was draped and prepped in the usual sterile manner and placed in a dorsal supine position with a  leftward tilt. A transverse was made and carried down through the subcutaneous tissue to the fascia. Fascial incision was made and extended transversely. The fascia was separated from the underlying rectus tissue superiorly and inferiorly. The peritoneum was identified and entered. Peritoneal incision was extended longitudinally. The utero-vesical peritoneal reflection was incised transversely and the bladder flap was bluntly freed from the lower uterine segment. A low transverse uterine incision was made. Delivered from cephalic presentation was a 3104 gram Living newborn female infant with Apgar scores of 9 at one minute and 10 at five minutes. Cord ph was sent the umbilical cord was clamped and cut cord blood was obtained for evaluation. The placenta was removed Intact and appeared normal. The uterine outline, tubes and ovaries appeared normal. The uterine incision was closed with running locked sutures of 0 monocryl.   Hemostasis was observed, however uterus with poor tone.  Given Methergine 0.2mg  IM, subsequently given 400mcg hemabate intramyometrial.  Figure of eight placed over small hematoma to ensure continued hemostasis. Lavage was carried out until clear.  The patient's left fallopian tube was then identified, the tube was followed out to the fimbria, clamped with kelly clamp and fimbria segment removed, sutured.  The right fallopian tube was then identified, followed out to the fimbria and clamped with kelly clamp.  Fimbriated segment removed and sutured.  Small amount of bleeding was noted from left segment, additional suture placed with excellent hemostasis noted.  The fascia was then reapproximated with running sutures of 0Vicryl. The skin was closed with staples  Instrument, sponge, and needle counts were correct prior the abdominal closure and were correct at the conclusion of the case.    Disposition: ICU - extubated and stable.   Maternal Condition: stable  Signed: [place] Fant  ROCIOMD 11/18/2014 5:11 PM

## 2014-11-18 NOTE — Transfer of Care (Signed)
Immediate Anesthesia Transfer of Care Note  Patient: Caroline Brooks  Procedure(s) Performed: Procedure(s): CESAREAN SECTION (N/A) BILATERAL SALPINGECTOMY (Bilateral)  Patient Location: PACU  Anesthesia Type:General  Level of Consciousness: awake, alert  and oriented  Airway & Oxygen Therapy: Patient Spontanous Breathing and Patient connected to nasal cannula oxygen  Post-op Assessment: Report given to RN and Post -op Vital signs reviewed and stable  Post vital signs: Reviewed and stable  Last Vitals:  Filed Vitals:   11/18/14 1302  BP: 130/73  Pulse: 93  Temp: 36.7 C  Resp: 20    Complications: No apparent anesthesia complications

## 2014-11-18 NOTE — Progress Notes (Signed)
Dr, Loreta AveAcosta to unit to assess patient.  Will be putting in transfer order to AICU. Received verbal order to give 50mcg Fentanyl IV.

## 2014-11-18 NOTE — Progress Notes (Signed)
Called AICU, spoke with Marcella Hudgson,RN. Informed pt will transfer to Room 374.

## 2014-11-18 NOTE — H&P (Signed)
Preoperative History and Physical  Caroline Brooks is a 24 y.o. [phone] with Patient's last menstrual period was 03/11/2014 (approximate). admitted for a repeat Caesarean section and bilateral tubal ligation possible salpingectomy for sterilization.  Pt has a history of gestational thrombocytopenia with a previous pregnancy, down to 103K an her platelets last week were 65K.  However they were clumped and felt repeated and 73K.  Drew yesterday without preservative and 56K.  Today 59K No history of ITP or other thrombocyopenia issues.  BP was elevated x 1 but all labs were normal otherwise and BP have consistently been normal.  She does have a history of pre ecalmpsia with the last pregnancy.  BP today have not suggested pre eclampsia  PMH:    Past Medical History  Diagnosis Date  . Pregnant 04/28/2014    PSH:     Past Surgical History  Procedure Laterality Date  . Cesarean section    . Cesarean section  11/26/2011    Procedure: CESAREAN SECTION;  Surgeon: Lazaro Arms, MD;  Location: [facility];  Service: Gynecology;  Laterality: N/A;    POb/GynH:      OB History    Gravida Para Term Preterm AB TAB SAB Ectopic Multiple Living   3 2 2  0 0 0 0 0 0 2      SH:   History  Substance Use Topics  . Smoking status: Former Smoker -- 0.50 packs/day for 5 years    Types: Cigarettes  . Smokeless tobacco: Never Used  . Alcohol Use: No     Comment: occ    FH:    Family History  Problem Relation Age of Onset  . Cancer Mother 77    breast   . Hypertension Father   . Diabetes Father   . Other Brother     Alpha 1  . Cancer Maternal Grandfather     prostate  . Hypertension Maternal Grandfather   . Early death Paternal Grandfather   . Alcohol abuse Paternal Grandfather      Allergies: No Known Allergies  Medications:       Current facility-administered medications:  .  bupivacaine liposome (EXPAREL) 1.3 % injection 266 mg, 20 mL, Infiltration, Once, Lazaro Arms, MD .  ceFAZolin  (ANCEF) 2-3 GM-% IVPB SOLR, , , ,  .  ceFAZolin (ANCEF) IVPB 2 g/50 mL premix, 2 g, Intravenous, On Call to OR, Lazaro Arms, MD .  lactated ringers infusion, , Intravenous, Continuous, Mal Amabile, MD, Last Rate: 125 mL/hr at 11/18/14 1409 .  scopolamine (TRANSDERM-SCOP) 1 MG/3DAYS 1.5 mg, 1 patch, Transdermal, Once, Karie Schwalbe, MD, 1.5 mg at 11/18/14 1307  Review of Systems:   Review of Systems  Constitutional: Negative for fever, chills, weight loss, malaise/fatigue and diaphoresis.  HENT: Negative for hearing loss, ear pain, nosebleeds, congestion, sore throat, neck pain, tinnitus and ear discharge.   Eyes: Negative for blurred vision, double vision, photophobia, pain, discharge and redness.  Respiratory: Negative for cough, hemoptysis, sputum production, shortness of breath, wheezing and stridor.   Cardiovascular: Negative for chest pain, palpitations, orthopnea, claudication, leg swelling and PND.  Gastrointestinal: Positive for abdominal pain. Negative for heartburn, nausea, vomiting, diarrhea, constipation, blood in stool and melena.  Genitourinary: Negative for dysuria, urgency, frequency, hematuria and flank pain.  Musculoskeletal: Negative for myalgias, back pain, joint pain and falls.  Skin: Negative for itching and rash.  Neurological: Negative for dizziness, tingling, tremors, sensory change, speech change, focal weakness, seizures, loss of consciousness, weakness  and headaches.  Endo/Heme/Allergies: Negative for environmental allergies and polydipsia. Does not bruise/bleed easily.  Psychiatric/Behavioral: Negative for depression, suicidal ideas, hallucinations, memory loss and substance abuse. The patient is not nervous/anxious and does not have insomnia.      PHYSICAL EXAM:  Blood pressure 130/73, pulse 93, temperature 98.1 F (36.7 C), temperature source Oral, resp. rate 20, height 5\' 9"  (1.753 m), weight 180 lb (81.647 kg), last menstrual period 03/11/2014, SpO2 100  %.    Vitals reviewed. Constitutional: She is oriented to person, place, and time. She appears well-developed and well-nourished.  HENT:  Head: Normocephalic and atraumatic.  Right Ear: External ear normal.  Left Ear: External ear normal.  Nose: Nose normal.  Mouth/Throat: Oropharynx is clear and moist.  Eyes: Conjunctivae and EOM are normal. Pupils are equal, round, and reactive to light. Right eye exhibits no discharge. Left eye exhibits no discharge. No scleral icterus.  Neck: Normal range of motion. Neck supple. No tracheal deviation present. No thyromegaly present.  Cardiovascular: Normal rate, regular rhythm, normal heart sounds and intact distal pulses.  Exam reveals no gallop and no friction rub.   No murmur heard. Respiratory: Effort normal and breath sounds normal. No respiratory distress. She has no wheezes. She has no rales. She exhibits no tenderness.  GI: Soft. Bowel sounds are normal. She exhibits no distension and no mass. There is tenderness. There is no rebound and no guarding.  Genitourinary:       Vulva is normal without lesions Vagina is pink moist without discharge Cervix normal in appearance and pap is normal Uterus is term size gravid Adnexa is negative with normal sized ovaries by sonogram  Musculoskeletal: Normal range of motion. She exhibits no edema and no tenderness.  Neurological: She is alert and oriented to person, place, and time. She has normal reflexes. She displays normal reflexes. No cranial nerve deficit. She exhibits normal muscle tone. Coordination normal.  Skin: Skin is warm and dry. No rash noted. No erythema. No pallor.  Psychiatric: She has a normal mood and affect. Her behavior is normal. Judgment and thought content normal.    Labs: Results for orders placed or performed during the hospital encounter of 11/18/14 (from the past 336 hour(s))  CBC   Collection Time: 11/18/14 12:35 PM  Result Value Ref Range   WBC 13.2 (H) 4.0 - 10.5 K/uL    RBC 4.02 3.87 - 5.11 MIL/uL   Hemoglobin 12.6 12.0 - 15.0 g/dL   HCT 16.1 09.6 - 04.5 %   MCV 90.5 78.0 - 100.0 fL   MCH 31.3 26.0 - 34.0 pg   MCHC 34.6 30.0 - 36.0 g/dL   RDW 40.9 81.1 - 91.4 %   Platelets 59 (L) 150 - 400 K/uL  Comprehensive metabolic panel   Collection Time: 11/18/14 12:35 PM  Result Value Ref Range   Sodium 134 (L) 135 - 145 mmol/L   Potassium 4.2 3.5 - 5.1 mmol/L   Chloride 105 101 - 111 mmol/L   CO2 25 22 - 32 mmol/L   Glucose, Bld 99 65 - 99 mg/dL   BUN 8 6 - 20 mg/dL   Creatinine, Ser 7.82 0.44 - 1.00 mg/dL   Calcium 8.6 (L) 8.9 - 10.3 mg/dL   Total Protein 6.5 6.5 - 8.1 g/dL   Albumin 3.4 (L) 3.5 - 5.0 g/dL   AST 16 15 - 41 U/L   ALT 11 (L) 14 - 54 U/L   Alkaline Phosphatase 204 (H) 38 - 126 U/L  Total Bilirubin 0.4 0.3 - 1.2 mg/dL   GFR calc non Af Amer >60 >60 mL/min   GFR calc Af Amer >60 >60 mL/min   Anion gap 4 (L) 5 - 15  Prepare RBC (crossmatch)   Collection Time: 11/18/14 12:35 PM  Result Value Ref Range   Order Confirmation ORDER PROCESSED BY BLOOD BANK   Type and screen   Collection Time: 11/18/14 12:35 PM  Result Value Ref Range   ABO/RH(D) B POS    Antibody Screen NEG    Sample Expiration 11/21/2014    Unit Number [id number]    Blood Component Type RBC LR PHER1    Unit division 00    Status of Unit ALLOCATED    Transfusion Status OK TO TRANSFUSE    Crossmatch Result Compatible    Unit Number [id number]    Blood Component Type RBC LR PHER2    Unit division 00    Status of Unit ALLOCATED    Transfusion Status OK TO TRANSFUSE    Crossmatch Result Compatible   ABO/Rh   Collection Time: 11/18/14 12:35 PM  Result Value Ref Range   ABO/RH(D) B POS   Results for orders placed or performed in visit on 11/17/14 (from the past 336 hour(s))  POCT urinalysis dipstick   Collection Time: 11/17/14  4:11 PM  Result Value Ref Range   Color, UA     Clarity, UA     Glucose, UA neg    Bilirubin, UA     Ketones, UA neg    Spec  Grav, UA     Blood, UA neg    pH, UA     Protein, UA trace    Urobilinogen, UA     Nitrite, UA neg    Leukocytes, UA Negative   Platelet Count on Citrated Bld   Collection Time: 11/17/14  4:44 PM  Result Value Ref Range   Plt Count, Citrated Bld 56 (LL) 150 - 379 X10E3/uL  Results for orders placed or performed in visit on 11/15/14 (from the past 336 hour(s))  CBC   Collection Time: 11/15/14  2:35 PM  Result Value Ref Range   WBC 11.3 (H) 3.4 - 10.8 x10E3/uL   RBC 4.18 3.77 - 5.28 x10E6/uL   Hemoglobin 12.8 11.1 - 15.9 g/dL   Hematocrit 30.8 65.7 - 46.6 %   MCV 89 79 - 97 fL   MCH 30.6 26.6 - 33.0 pg   MCHC 34.4 31.5 - 35.7 g/dL   RDW 84.6 96.2 - 95.2 %   Platelets 73 (LL) 150 - 379 x10E3/uL   Hematology Comments: Note:   Results for orders placed or performed in visit on 11/11/14 (from the past 336 hour(s))  POCT urinalysis dipstick   Collection Time: 11/11/14 11:44 AM  Result Value Ref Range   Color, UA yellow    Clarity, UA clear    Glucose, UA neg    Bilirubin, UA     Ketones, UA neg    Spec Grav, UA     Blood, UA neg    pH, UA     Protein, UA trace    Urobilinogen, UA     Nitrite, UA neg    Leukocytes, UA Negative   Results for orders placed or performed in visit on 11/09/14 (from the past 336 hour(s))  POCT Urinalysis Dipstick   Collection Time: 11/09/14  9:45 AM  Result Value Ref Range   Color, UA     Clarity, UA  Glucose, UA neg    Bilirubin, UA     Ketones, UA neg    Spec Grav, UA     Blood, UA neg    pH, UA     Protein, UA neg    Urobilinogen, UA     Nitrite, UA neg    Leukocytes, UA Negative   CBC   Collection Time: 11/09/14 10:31 AM  Result Value Ref Range   WBC 11.4 (H) 3.4 - 10.8 x10E3/uL   RBC 4.02 3.77 - 5.28 x10E6/uL   Hemoglobin 12.3 11.1 - 15.9 g/dL   Hematocrit 40.9 81.1 - 46.6 %   MCV 91 79 - 97 fL   MCH 30.6 26.6 - 33.0 pg   MCHC 33.8 31.5 - 35.7 g/dL   RDW 91.4 78.2 - 95.6 %   Platelets 69 (LL) 150 - 379 x10E3/uL    Hematology Comments: Note:   Comprehensive metabolic panel   Collection Time: 11/09/14 10:31 AM  Result Value Ref Range   Glucose 92 65 - 99 mg/dL   BUN 8 6 - 20 mg/dL   Creatinine, Ser 2.13 (L) 0.57 - 1.00 mg/dL   GFR calc non Af Amer 131 >59 mL/min/1.73   GFR calc Af Amer 151 >59 mL/min/1.73   BUN/Creatinine Ratio 14 8 - 20   Sodium 138 134 - 144 mmol/L   Potassium 3.9 3.5 - 5.2 mmol/L   Chloride 103 97 - 108 mmol/L   CO2 20 18 - 29 mmol/L   Calcium 8.9 8.7 - 10.2 mg/dL   Total Protein 6.0 6.0 - 8.5 g/dL   Albumin 3.7 3.5 - 5.5 g/dL   Globulin, Total 2.3 1.5 - 4.5 g/dL   Albumin/Globulin Ratio 1.6 1.1 - 2.5   Bilirubin Total 0.3 0.0 - 1.2 mg/dL   Alkaline Phosphatase 184 (H) 39 - 117 IU/L   AST 15 0 - 40 IU/L   ALT 14 0 - 32 IU/L  Protein / creatinine ratio, urine   Collection Time: 11/09/14 10:31 AM  Result Value Ref Range   Creatinine, Urine 183.4 Not Estab. mg/dL   Protein, Ur 08.6 Not Estab. mg/dL   Protein/Creat Ratio 578 0 - 200 mg/g creat  Culture, beta strep (group b only)   Collection Time: 11/09/14 11:00 AM  Result Value Ref Range   Strep Gp B Culture Negative Negative  GC/Chlamydia Probe Amp   Collection Time: 11/09/14 11:31 AM  Result Value Ref Range   Chlamydia trachomatis, NAA Negative Negative   Neisseria gonorrhoeae by PCR Negative Negative   PLEASE NOTE: Comment     EKG: No orders found for this or any previous visit.  Imaging Studies: US Ob Follow Up  11/10/2014   FOLLOW UP SONOGRAM   Caroline Brooks is in the office for a follow up sonogram for EFW/AFI.  She is a 24 y.o. year old G13P2002 with Estimated Date of Delivery: 12/02/14  by early [place] now at  [redacted]w[redacted]d weeks gestation. Thus far the pregnancy has been  complicated by history of csection x 2, +UDS, and previous child with CHD.  GESTATION: SINGLETON  PRESENTATION: cephalic  FETAL ACTIVITY:          Heart rate         137 bpm          The fetus is active.  AMNIOTIC FLUID: The amniotic fluid volume is   normal, AFI: 17.23 cm, SVP 5.29cm.  PLACENTA LOCALIZATION:  anterior GRADE 1  ADNEXA: Right ovary appears normal.  Left ovary not visualized today.    GESTATIONAL AGE AND  BIOMETRICS:  Gestational criteria: Estimated Date of Delivery: 12/02/14 by early US now  at 1621w5d  Previous Scans:4              BIPARIETAL DIAMETER           9.04 cm         36+4 weeks  HEAD CIRCUMFERENCE           31.49 cm         35+2 weeks  ABDOMINAL CIRCUMFERENCE           32.91 cm         36+6 weeks  FEMUR LENGTH           6.38 cm         33+0 weeks                                                           AVERAGE EGA(BY THIS SCAN):   35+3 weeks                                                 ESTIMATED FETAL WEIGHT:        2745  grams, 29% ANATOMICAL SURVEY                                                                             COMMENTS      CHOROID PLEXUS yes normal   CEREBELLUM yes normal   CISTERNA MAGNA yes normal                  NOSE/LIP yes normal        4 CHAMBERED HEART yes normal   OUTFLOW TRACTS yes normal DA/AA normal  DIAPHRAGM yes normal   STOMACH yes normal   RENAL REGION yes normal   BLADDER yes normal                                       SUSPECTED ABNORMALITIES:  no  QUALITY OF SCAN: satisfactory  TECHNICIAN COMMENTS:  Follow Up US today at 36+[redacted] weeks GA.  Single, active female fetus in a  cephalic presentation.  FHR 137 bpm.  Anterior Gr 1 placenta. Right ovary  appears normal. Left ovary not well visualized. Fluid is WNL with AFI  17.23cm and SVP 5.29cm.  EFW 2745 g(29%) is consistent with dating.        A copy of this report including all images has been saved and backed up to  a second source for retrieval if needed. All measures and details of the  anatomical scan, placentation, fluid volume and pelvic anatomy are  contained in that report.  Iva LentoStalter, Carrie Manuel 11/09/2014 9:44 AM  Clinical Impression and recommendations:  I have reviewed the sonogram results above.  Combined with the patient's current clinical  course, below are my  impressions and any appropriate recommendations for management based on  the sonographic findings:  1. Singleton IUP, Vertex, with appropriate fetal weight.and growth.  FERGUSON,JOHN V        Assessment: [redacted]w[redacted]d Estimated Date of Delivery: 12/02/14  Thrombocytopenia: gestational vs other etiology, doubt pre eclampsia, stable for a week  Patient Active Problem List   Diagnosis Date Noted  . Elevated BP 11/09/2014  . Hx of preeclampsia, prior pregnancy, currently pregnant 10/19/2014  . Previous cesarean delivery, antepartum 10/19/2014  . Depression affecting pregnancy, antepartum 10/19/2014  . Marijuana use 10/19/2014  . Supervision of normal pregnancy 04/28/2014    Plan: Repeat section with bilateral ligation or salpingectomy  EURE,LUTHER H 11/18/2014 2:50 PM

## 2014-11-18 NOTE — Anesthesia Postprocedure Evaluation (Signed)
  Anesthesia Post-op Note  Patient: Caroline Brooks  Procedure(s) Performed: Procedure(s) (LRB): CESAREAN SECTION (N/A) BILATERAL SALPINGECTOMY (Bilateral)  Patient Location: PACU  Anesthesia Type: General  Level of Consciousness: awake and alert   Airway and Oxygen Therapy: Patient Spontanous Breathing  Post-op Pain: mild  Post-op Assessment: Post-op Vital signs reviewed, Patient's Cardiovascular Status Stable, Respiratory Function Stable, Patent Airway and No signs of Nausea or vomiting  Last Vitals:  Filed Vitals:   11/18/14 1302  BP: 130/73  Pulse: 93  Temp: 36.7 C  Resp: 20    Post-op Vital Signs: stable   Complications: No apparent anesthesia complications

## 2014-11-18 NOTE — Anesthesia Preprocedure Evaluation (Signed)
Anesthesia Evaluation  Patient identified by MRN, date of birth, ID band Patient awake    Reviewed: Allergy & Precautions, NPO status , Patient's Chart, lab work & pertinent test results  History of Anesthesia Complications Negative for: history of anesthetic complications  Airway Mallampati: II  TM Distance: >3 FB Neck ROM: Full    Dental no notable dental hx. (+) Dental Advisory Given   Pulmonary former smoker,  breath sounds clear to auscultation  Pulmonary exam normal       Cardiovascular negative cardio ROS Normal cardiovascular examRhythm:Regular Rate:Normal     Neuro/Psych PSYCHIATRIC DISORDERS Depression negative neurological ROS     GI/Hepatic negative GI ROS, Neg liver ROS,   Endo/Other  negative endocrine ROS  Renal/GU negative Renal ROS  negative genitourinary   Musculoskeletal negative musculoskeletal ROS (+)   Abdominal   Peds negative pediatric ROS (+)  Hematology thrombocytopenia   Anesthesia Other Findings   Reproductive/Obstetrics negative OB ROS                             Anesthesia Physical Anesthesia Plan  ASA: II  Anesthesia Plan: General   Post-op Pain Management:    Induction: Intravenous, Rapid sequence and Cricoid pressure planned  Airway Management Planned: Oral ETT  Additional Equipment:   Intra-op Plan:   Post-operative Plan: Extubation in OR  Informed Consent: I have reviewed the patients History and Physical, chart, labs and discussed the procedure including the risks, benefits and alternatives for the proposed anesthesia with the patient or authorized representative who has indicated his/her understanding and acceptance.   Dental advisory given  Plan Discussed with: CRNA  Anesthesia Plan Comments:         Anesthesia Quick Evaluation

## 2014-11-18 NOTE — Progress Notes (Signed)
Lindwood Cokealled Jessica for report. Patient will be admitted in R#[id number]

## 2014-11-18 NOTE — Anesthesia Procedure Notes (Signed)
Procedure Name: Intubation Date/Time: 11/18/2014 3:18 PM Performed by: Shanon PayorGREGORY, Raife Lizer M Pre-anesthesia Checklist: Patient identified, Emergency Drugs available, Suction available, Timeout performed and Patient being monitored Patient Re-evaluated:Patient Re-evaluated prior to inductionOxygen Delivery Method: Circle system utilized Preoxygenation: Pre-oxygenation with 100% oxygen Intubation Type: Cricoid Pressure applied, Rapid sequence and IV induction Grade View: Grade I Tube type: Oral Tube size: 7.0 mm Number of attempts: 1 Airway Equipment and Method: Stylet Placement Confirmation: ETT inserted through vocal cords under direct vision,  positive ETCO2 and breath sounds checked- equal and bilateral Secured at: 20 cm Tube secured with: Tape Dental Injury: Teeth and Oropharynx as per pre-operative assessment  Comments: RSI, Intubation by Dr. Gentry RochJudd, easy, atraumatic intubation.

## 2014-11-19 LAB — CBC
HCT: 28.2 % — ABNORMAL LOW (ref 36.0–46.0)
HEMATOCRIT: 28 % — AB (ref 36.0–46.0)
Hemoglobin: 9.6 g/dL — ABNORMAL LOW (ref 12.0–15.0)
Hemoglobin: 9.4 g/dL — ABNORMAL LOW (ref 12.0–15.0)
MCH: 30.8 pg (ref 26.0–34.0)
MCH: 30.5 pg (ref 26.0–34.0)
MCHC: 34 g/dL (ref 30.0–36.0)
MCHC: 33.6 g/dL (ref 30.0–36.0)
MCV: 90.4 fL (ref 78.0–100.0)
MCV: 90.9 fL (ref 78.0–100.0)
Platelets: 41 10*3/uL — ABNORMAL LOW (ref 150–400)
Platelets: 38 10*3/uL — ABNORMAL LOW (ref 150–400)
RBC: 3.12 MIL/uL — ABNORMAL LOW (ref 3.87–5.11)
RBC: 3.08 MIL/uL — ABNORMAL LOW (ref 3.87–5.11)
RDW: 14 % (ref 11.5–15.5)
RDW: 14.1 % (ref 11.5–15.5)
WBC: 12.8 10*3/uL — AB (ref 4.0–10.5)
WBC: 12 10*3/uL — AB (ref 4.0–10.5)

## 2014-11-19 LAB — COMPREHENSIVE METABOLIC PANEL
ALBUMIN: 2.9 g/dL — AB (ref 3.5–5.0)
ALT: 13 U/L — ABNORMAL LOW (ref 14–54)
AST: 34 U/L (ref 15–41)
Alkaline Phosphatase: 148 U/L — ABNORMAL HIGH (ref 38–126)
Anion gap: 4 — ABNORMAL LOW (ref 5–15)
BUN: 8 mg/dL (ref 6–20)
CHLORIDE: 103 mmol/L (ref 101–111)
CO2: 28 mmol/L (ref 22–32)
Calcium: 8.4 mg/dL — ABNORMAL LOW (ref 8.9–10.3)
Creatinine, Ser: 0.61 mg/dL (ref 0.44–1.00)
GFR calc Af Amer: >60 mL/min (ref >60)
GFR calc non Af Amer: >60 mL/min (ref >60)
GLUCOSE: 100 mg/dL — AB (ref 65–99)
Potassium: 3.8 mmol/L (ref 3.5–5.1)
Sodium: 135 mmol/L (ref 135–145)
TOTAL PROTEIN: 5.9 g/dL — AB (ref 6.5–8.1)
Total Bilirubin: 0.6 mg/dL (ref 0.3–1.2)

## 2014-11-19 LAB — RPR: RPR: NONREACTIVE

## 2014-11-19 MED ORDER — HYDROCODONE-ACETAMINOPHEN 5-325 MG PO TABS: 1.0000 | ORAL_TABLET | ORAL | Status: DC | PRN

## 2014-11-19 MED ORDER — SODIUM CHLORIDE 0.9 % IJ SOLN
3.0000 mL | Freq: Two times a day (BID) | INTRAMUSCULAR | Status: DC
  Administered 2014-11-19 – 2014-11-21 (×4): 3 mL via INTRAVENOUS

## 2014-11-19 MED ORDER — HYDROMORPHONE HCL 2 MG PO TABS
2.0000 mg | ORAL_TABLET | ORAL | Status: DC | PRN
  Administered 2014-11-19 – 2014-11-20 (×2): 2 mg via ORAL
  Filled 2014-11-19 (×4): qty 1

## 2014-11-19 MED ORDER — LORAZEPAM 2 MG/ML IJ SOLN
1.0000 mg | Freq: Four times a day (QID) | INTRAMUSCULAR | Status: DC | PRN
  Filled 2014-11-19: qty 0.5

## 2014-11-19 MED ORDER — ACETAMINOPHEN 10 MG/ML IV SOLN
1000.0000 mg | Freq: Once | INTRAVENOUS | Status: AC
  Administered 2014-11-19: 1000 mg via INTRAVENOUS
  Filled 2014-11-19: qty 100

## 2014-11-19 NOTE — Progress Notes (Signed)
Discussed POC with Dr. Arnette FeltsAcousta. [facility].

## 2014-11-19 NOTE — Progress Notes (Signed)
Dr Loreta AveAcosta called and given report of Platelet Count. Orders received to draw CBC in am. Orders also received for Dilaudid pain medication due to patient stating "Vicodin does not work for me". Vicodin was ordered earlier this shift due to Percocet being ineffective in pain relief per patient.

## 2014-11-19 NOTE — Addendum Note (Signed)
Addendum  created 11/19/14 1106 by Algis GreenhouseLinda A Janesa Dockery, CRNA   Modules edited: Notes Section   Notes Section:  File: [id number]

## 2014-11-19 NOTE — Progress Notes (Addendum)
Update provided to Dr. Despina HiddenEure. Pt's pain control has improved and VSS. [facility]

## 2014-11-19 NOTE — Anesthesia Postprocedure Evaluation (Signed)
Anesthesia Post Note  Patient: Caroline Brooks  Procedure(s) Performed: Procedure(s) (LRB): CESAREAN SECTION (N/A) BILATERAL SALPINGECTOMY (Bilateral)  Anesthesia type: General  Patient location: AICU  Post pain: Pain level controlled  Post assessment: Post-op Vital signs reviewed  Last Vitals:  Filed Vitals:   11/19/14 0820  BP: 109/52  Pulse: 87  Temp:   Resp: 16    Post vital signs: Reviewed  Level of consciousness: sedated  Complications: No apparent anesthesia complications

## 2014-11-19 NOTE — Progress Notes (Signed)
Subjective: Postpartum Day 1: Cesarean Delivery Patient reports incisional pain and tolerating PO.    Objective: Vital signs in last 24 hours: Temp:  [97.5 F (36.4 C)-98.3 F (36.8 C)] 97.9 F (36.6 C) (05/28 0400) Pulse Rate:  [66-93] 69 (05/28 0600) Resp:  [14-24] 18 (05/28 0529) BP: (103-131)/(41-95) 103/56 mmHg (05/28 0600) SpO2:  [95 %-100 %] 98 % (05/28 0600) FiO2 (%):  [98 %-100 %] 98 % (05/28 0529) Weight:  [171 lb 3.2 oz (77.656 kg)-180 lb (81.647 kg)] 171 lb 3.2 oz (77.656 kg) (05/28 0529)  Physical Exam:  General: alert, cooperative and appears stated age Lochia: appropriate Uterine Fundus: firm Incision: no significant drainage DVT Evaluation: No evidence of DVT seen on physical exam.   Recent Labs  11/18/14 2125 11/19/14 0525  HGB 10.3* 9.6*  HCT 30.3* 28.2*    Assessment/Plan: Status post Cesarean section. Doing well postoperatively.  Continue current care. D/C PCA Ambulation Transfer from ICU  Bryant Saye S 11/19/2014, 7:01 AM

## 2014-11-20 LAB — TYPE AND SCREEN
ABO/RH(D): B POS
ANTIBODY SCREEN: NEGATIVE
UNIT DIVISION: 0
Unit division: 0

## 2014-11-20 LAB — CBC
HCT: 28.3 % — ABNORMAL LOW (ref 36.0–46.0)
HEMOGLOBIN: 9.4 g/dL — AB (ref 12.0–15.0)
MCH: 30.3 pg (ref 26.0–34.0)
MCHC: 33.2 g/dL (ref 30.0–36.0)
MCV: 91.3 fL (ref 78.0–100.0)
Platelets: 35 10*3/uL — ABNORMAL LOW (ref 150–400)
RBC: 3.1 MIL/uL — ABNORMAL LOW (ref 3.87–5.11)
RDW: 14.1 % (ref 11.5–15.5)
WBC: 11.7 10*3/uL — AB (ref 4.0–10.5)

## 2014-11-20 MED ORDER — HYDROMORPHONE HCL 2 MG PO TABS
4.0000 mg | ORAL_TABLET | ORAL | Status: DC | PRN
  Administered 2014-11-20 – 2014-11-21 (×7): 4 mg via ORAL
  Filled 2014-11-20 (×6): qty 2

## 2014-11-20 MED ORDER — BISACODYL 10 MG RE SUPP
10.0000 mg | Freq: Every day | RECTAL | Status: DC | PRN
  Administered 2014-11-20: 10 mg via RECTAL
  Filled 2014-11-20: qty 1

## 2014-11-20 NOTE — Progress Notes (Addendum)
Post Partum Day 2 Subjective:  Caroline Brooks is a 24 y.o. [id number] 3622w0d s/p rLTCS and bilateral fimbriectomy 2/2 thrombocytopenia.  No acute events overnight.  Pt denies problems with ambulating, voiding or po intake.  She denies nausea or vomiting.  Pain is poorly controlled.  She has had flatus. She has not had bowel movement.  Lochia Moderate.  Method of Feeding: bottle  Objective: Blood pressure 120/55, pulse 73, temperature 97.9 F (36.6 C), temperature source Oral, resp. rate 20, height 5\' 9"  (1.753 m), weight 171 lb 3.2 oz (77.656 kg), last menstrual period 03/11/2014, SpO2 97 %, unknown if currently breastfeeding.  Physical Exam:  General: alert, cooperative and no distress Lochia:normal flow Chest: CTAB Heart: RRR no m/r/g Abdomen: +BS, soft, nontender,  Uterine Fundus: firm DVT Evaluation: No evidence of DVT seen on physical exam. Extremities: no edema   Recent Labs  11/19/14 2000 11/20/14 0700  HGB 9.4* 9.4*  HCT 28.0* 28.3*    Assessment/Plan:  ASSESSMENT: Caroline ParaJennifer T Brooks is a 24 y.o. [id number] 4322w0d s/p rLTCS with bilateral fimbriectomy  Plan for discharge tomorrow  Constipation: dulcolax prn Thrombocytopenia: not actively bleeding, hemoglobin very stable, continue to follow Pain: uncontrolled, increased dilaudid 2mg  PO to 2-4mg  PO prn q4h   LOS: 2 days   Avett Reineck ROCIO 11/20/2014, 8:25 AM

## 2014-11-20 NOTE — Progress Notes (Signed)
CLINICAL SOCIAL WORK MATERNAL/CHILD NOTE  Patient Details  Name: Caroline Brooks MRN: [id number] Date of Birth: 11/18/2015  Date: 11/20/2014  Clinical Social Worker Initiating Note: Chatham Howington, LCSWDate/ Time Initiated: 11/20/14/1130   Child's Name:  (Parents Joey and Teddy Spike )   Legal Guardian:     Need for Interpreter: None   Date of Referral: 11/18/14   Reason for Referral: Other (Comment)   Referral Source: [facility]   Address: [place], [place] [address]  Phone number:  [phone])   Household Members: Minor Children, Spouse   Natural Supports (not living in the home): Extended Family, Immediate Family   Professional Supports:None   Employment: (Spouse is employed )   Type of Work:     Education:     Museum/gallery curator Resources:Medicaid   Other Resources: [employer], Physicist, medical    Cultural/Religious Considerations Which May Impact Care:none noted  Strengths: Ability to meet basic needs , Home prepared for child    Risk Factors/Current Problems:  (Hx of marijuana use)   Cognitive State: Alert , Able to Concentrate    Mood/Affect: Comfortable , Calm , Relaxed , Happy    CSW Assessment: Acknowledged order for social work consult to assess mother's hx of mental illness and substance abuse. Met with mother who was pleasant and receptive to social work. Parents are married and have two other dependents ages 98 and 32. Spouse was also present, but asleep. Mother reports hx of depression and states that she is currently being treated with Lexapro. She admits to one time use of marijuana during pregnancy because she could not keep anything down and knew that the marijuana would help with the nausea. MOB was positive for marijuana 05/26/14 and newborn's UDS was negative. She reports no hx of DSS involvement. She denies any other illicit drug use. Mother informed of social  work Fish farm manager.  CSW Plan/Description:    Provided information and resources on PP Depression No further intervention required No barriers to discharge   Gladyse Corvin J, LCSW 11/20/2014, 2:45 PM

## 2014-11-21 ENCOUNTER — Encounter (HOSPITAL_COMMUNITY): Payer: Self-pay | Admitting: Obstetrics & Gynecology

## 2014-11-21 LAB — CBC
HEMATOCRIT: 28.6 % — AB (ref 36.0–46.0)
Hemoglobin: 9.5 g/dL — ABNORMAL LOW (ref 12.0–15.0)
MCH: 30.4 pg (ref 26.0–34.0)
MCHC: 33.2 g/dL (ref 30.0–36.0)
MCV: 91.7 fL (ref 78.0–100.0)
Platelets: 40 10*3/uL — ABNORMAL LOW (ref 150–400)
RBC: 3.12 MIL/uL — ABNORMAL LOW (ref 3.87–5.11)
RDW: 14.1 % (ref 11.5–15.5)
WBC: 10 10*3/uL (ref 4.0–10.5)

## 2014-11-21 MED ORDER — ACETAMINOPHEN 325 MG PO TABS: 650.0000 mg | ORAL_TABLET | ORAL | Status: DC | PRN

## 2014-11-21 MED ORDER — SENNOSIDES-DOCUSATE SODIUM 8.6-50 MG PO TABS: 2.0000 | ORAL_TABLET | ORAL | Status: DC

## 2014-11-21 MED ORDER — OXYCODONE HCL 5 MG PO TABS: 5.0000 mg | ORAL_TABLET | ORAL | Status: DC | PRN

## 2014-11-21 NOTE — Discharge Summary (Signed)
Obstetric Discharge Summary Reason for Admission: onset of labor and cesarean section Prenatal Procedures: none Intrapartum Procedures: cesarean: low cervical, transverse and tubal ligation Postpartum Procedures: P.P. tubal ligation Complications-Operative and Postpartum: thrombocytopenia  Cesarean Section Procedure Note   Caroline Brooks  11/18/2015  Indications: thrombocytopenia   Pre-operative Diagnosis: PREVious cesarean section, satisfied parity  Post-operative Diagnosis: Same, bilateral fimbriectomy, uterine atony  Surgeon: Surgeon(s) and Role:  * Lazaro Arms, MD - Primary  * Fredirick Lathe, MD - Fellow   Assistants: Dr. Fredirick Lathe  Anesthesia: general   Estimated Blood Loss:  Total IV Fluids:   Urine Output: 200CC OF clear urine  Specimens: bilateral fimbria  Findings: normal uterus, ovaries, fallopian tubes  Baby condition / location: Nursery   APGAR: 9, 10; weight 6 lb 13.5 oz (3104 g), female  Complications: Patient requested bilateral salpingectomy, however given patient's thrombocytopenia, risk of bleeding possibility of alternative sterilization via filshie clip vs fimbriectomy were discussed with patient. At time of surgery it was decided that salpingectomy could not be safely achieved, therefore bilateral fimbriectomy was performed.  Indications: Caroline Brooks is a 24 y.o. [phone] with an IUP [redacted]w[redacted]d presenting with for repeat cesarean section at 38weeks due to worsening thrombocytopenia. At this time no signs/symptoms of preeclampsia, ITP, thrombocytopenia due to gestational thrombocytopenia. Trending 170>69>59.  The risks, benefits, complications, treatment options, and expected outcomes were discussed with the patient . The patient concurred with the proposed plan, giving informed consent. identified as Caroline Brooks and the procedure verified as C-Section Delivery.  Procedure Details: A Time Out was held and the  above information confirmed.  The patient was taken back to the operative suite where patient was placed under general anesthesia due to thrombocytopenia. After induction of anesthesia, the patient was draped and prepped in the usual sterile manner and placed in a dorsal supine position with a leftward tilt. A transverse was made and carried down through the subcutaneous tissue to the fascia. Fascial incision was made and extended transversely. The fascia was separated from the underlying rectus tissue superiorly and inferiorly. The peritoneum was identified and entered. Peritoneal incision was extended longitudinally. The utero-vesical peritoneal reflection was incised transversely and the bladder flap was bluntly freed from the lower uterine segment. A low transverse uterine incision was made. Delivered from cephalic presentation was a 3104 gram Living newborn female infant with Apgar scores of 9 at one minute and 10 at five minutes. Cord ph was sent the umbilical cord was clamped and cut cord blood was obtained for evaluation. The placenta was removed Intact and appeared normal. The uterine outline, tubes and ovaries appeared normal. The uterine incision was closed with running locked sutures of 0 monocryl. Hemostasis was observed, however uterus with poor tone. Given Methergine 0.2mg  IM, subsequently given hemabate intramyometrial. Figure of eight placed over small hematoma to ensure continued hemostasis. Lavage was carried out until clear. The patient's left fallopian tube was then identified, the tube was followed out to the fimbria, clamped with kelly clamp and fimbria segment removed, sutured. The right fallopian tube was then identified, followed out to the fimbria and clamped with kelly clamp. Fimbriated segment removed and sutured. Small amount of bleeding was noted from left segment, additional suture placed with excellent hemostasis noted.  The fascia was then reapproximated with  running sutures of 0Vicryl. The skin was closed with staples  Instrument, sponge, and needle counts were correct prior the abdominal closure and were correct at the conclusion  of the case.    Disposition: ICU - extubated and stable.   Maternal Condition: stable    Signed: ACOSTA,KRISTY ROCIOMD 11/18/2014 5:11 PM  Hospital Course:  Active Problems:   Thrombocytopenia affecting pregnancy, antepartum   Caroline Brooks is a 24 y.o. [phone] s/p LTCS.  Patient was admitted on 5/27.  She has postpartum course that was complicated by thrombocytopenia with stable HgB and poor pain control. The pt feels ready to go home and  will be discharged with outpatient follow-up.   Today: No acute events overnight.  Pt denies problems with ambulating, voiding or po intake.  She denies nausea or vomiting.  Pain is moderately controlled.  She has had flatus. She has not had bowel movement.  Lochia Minimal.  Plan for birth control is  bilateral tubal ligation.  Method of Feeding: bottle  Physical Exam:  General: alert, cooperative and appears stated age 24: appropriate Uterine Fundus: soft Incision: healing well, no significant drainage, no dehiscence, no significant erythema DVT Evaluation: No evidence of DVT seen on physical exam.  H/H: Lab Results  Component Value Date/Time   HGB 9.5* 11/21/2014 05:40 AM   HCT 28.6* 11/21/2014 05:40 AM   HCT 37.2 11/15/2014 02:35 PM    Discharge Diagnoses: Term Pregnancy-delivered  Discharge Information: Date: 11/21/2014 Activity: pelvic rest Diet: routine  Medications: Percocet Breast feeding:  No: formula feeding Condition: stable Instructions: refer to handout Discharge to: home   Discharge Instructions    Activity as tolerated    Complete by:  As directed      Call MD for:  difficulty breathing, headache or visual disturbances    Complete by:  As directed      Call MD for:  extreme fatigue    Complete by:  As directed      Call MD for:   hives    Complete by:  As directed      Call MD for:  persistant dizziness or light-headedness    Complete by:  As directed      Call MD for:  persistant nausea and vomiting    Complete by:  As directed      Call MD for:  redness, tenderness, or signs of infection (pain, swelling, redness, odor or green/yellow discharge around incision site)    Complete by:  As directed      Call MD for:  severe uncontrolled pain    Complete by:  As directed      Call MD for:  temperature >100.4    Complete by:  As directed      Diet - low sodium heart healthy    Complete by:  As directed      Discharge instructions    Complete by:  As directed   Taking care of yourself after Baby arrives. Vaginal Bleeding: Vaginal bleeding is common after delivery, with the amount decreasing gradually over 1-2 weeks. If the bleeding increases, is mixed with pus, or is foul-smelling, call your doctor, as this may be a sign of infection.  Abdominal Pain: Abdominal cramping after delivery is common, especially when you breastfeed. The same hormones responsible for letting milk down to your nipple also contract your uterus. If the pain worsens, or occurs more frequently over 48 hrs after delivery, call your doctor.  Fevers: After delivery you are at increased risk of developing an infection. If you have a fever, increased vaginal bleeding, foul-smelling vaginal discharge, or increased abdominal pain, call your doctor.  Breast Feeding: Feeding every  1.5-3 hours keeps Baby satisfied and your milk in good supply. If 3 hours have gone by and Baby is sleeping, wake him/her up to feed. Nurse for 15-20 minutes on one breast before offering the other. Breast-fed babies often lose up to 7% of their birth weight in the first few days of life, but should start gaining about an ounce per day after 4 days. For more information about breastfeeding, go to [email or web address]://[email or web address]/breastfeeding.  Mastitis (Breast infection): Breaks in the skin  or bacteria passing into your breast ducts can cause an infection. If you notice a triangular shaped area on your breast that is red, warm to the touch and tender, call your doctor. It is safe for Baby and helps you to heal faster if you keep breast feeding through this infection.  Postpartum Depression: Postpartum depression is very common after a woman delivers because of all the hormonal changes happening in her body. If you notice that you start to feel more sad or anxious than usual or have any thoughts of hurting yourself or Baby, tell someone right away.  If you have any questions or concerns, please call your doctor.     Leave dressing on - Keep it clean, dry, and intact until clinic visit    Complete by:  As directed      Lifting restrictions    Complete by:  As directed   Weight restriction of 10-15 lbs.            Medication List    TAKE these medications        acetaminophen 325 MG tablet  Commonly known as:  TYLENOL  Take 2 tablets (650 mg total) by mouth every 4 (four) hours as needed (for pain scale < 4).     calcium carbonate 500 MG chewable tablet  Commonly known as:  TUMS - dosed in mg elemental calcium  Chew 2 tablets by mouth 3 (three) times daily as needed for indigestion or heartburn.     escitalopram 20 MG tablet  Commonly known as:  LEXAPRO  Take 1 tablet (20 mg total) by mouth daily.     FLINTSTONES GUMMIES PO  Take 2 each by mouth daily.     oxyCODONE 5 MG immediate release tablet  Commonly known as:  Oxy IR/ROXICODONE  Take 1 tablet (5 mg total) by mouth every 4 (four) hours as needed for severe pain.     senna-docusate 8.6-50 MG per tablet  Commonly known as:  Senokot-S  Take 2 tablets by mouth daily.         Elita Booneoberts, Deandria Klute C ,MD OB Fellow 11/21/2014,10:38 AM

## 2014-11-21 NOTE — Progress Notes (Signed)
RN called pharmacy to ask if there were any warnings about giving the tdap vaccine because of the patient's low platelet count of 40.  Pharmacy did say there was a warning about giving the tdap to someone with bleeding disorders such as thrombocytopenia.  Caroline Chickaroline Roberts, MD, notified and decision was made to hold vaccine until follow up OB appointment.

## 2014-11-22 ENCOUNTER — Emergency Department (HOSPITAL_COMMUNITY)
Admission: EM | Admit: 2014-11-22 | Discharge: 2014-11-22 | Disposition: A | Discharge status: Home / Self Care | Payer: Medicaid Other | Attending: Emergency Medicine | Admitting: Emergency Medicine

## 2014-11-22 ENCOUNTER — Encounter (HOSPITAL_COMMUNITY): Payer: Self-pay | Admitting: Emergency Medicine

## 2014-11-22 DIAGNOSIS — R040 Epistaxis: Secondary | ICD-10-CM

## 2014-11-22 DIAGNOSIS — R21 Rash and other nonspecific skin eruption: Secondary | ICD-10-CM | POA: Diagnosis not present

## 2014-11-22 DIAGNOSIS — Z87891 Personal history of nicotine dependence: Secondary | ICD-10-CM | POA: Diagnosis not present

## 2014-11-22 DIAGNOSIS — Z79899 Other long term (current) drug therapy: Secondary | ICD-10-CM | POA: Insufficient documentation

## 2014-11-22 NOTE — ED Notes (Signed)
Pt. reports epistaxis at home this evening and bruise at lower abdominal incision onset today , pt[facility] was [facility] after a c-section last 11/18/2014 .

## 2014-11-22 NOTE — Discharge Instructions (Signed)
Return to the ER if the bleeding doesn't stop with pressure. See your [facility] doctor tomorrow.   Nosebleed Nosebleeds can be caused by many conditions, including trauma, infections, polyps, foreign bodies, dry mucous membranes or climate, medicines, and air conditioning. Most nosebleeds occur in the front of the nose. Because of this location, most nosebleeds can be controlled by pinching the nostrils gently and continuously for at least 10 to 20 minutes. The long, continuous pressure allows enough time for the blood to clot. If pressure is released during that 10 to 20 minute time period, the process may have to be started again. The nosebleed may stop by itself or quit with pressure, or it may need concentrated heating (cautery) or pressure from packing. HOME CARE INSTRUCTIONS   If your nose was packed, try to maintain the pack inside until your health care provider removes it. If a gauze pack was used and it starts to fall out, gently replace it or cut the end off. Do not cut if a balloon catheter was used to pack the nose. Otherwise, do not remove unless instructed.  Avoid blowing your nose for 12 hours after treatment. This could dislodge the pack or clot and start the bleeding again.  If the bleeding starts again, sit up and bend forward, gently pinching the front half of your nose continuously for 20 minutes.  If bleeding was caused by dry mucous membranes, use over-the-counter saline nasal spray or gel. This will keep the mucous membranes moist and allow them to heal. If you must use a lubricant, choose the water-soluble variety. Use it only sparingly and not within several hours of lying down.  Do not use petroleum jelly or mineral oil, as these may drip into the lungs and cause serious problems.  Maintain humidity in your home by using less air conditioning or by using a humidifier.  Do not use aspirin or medicines which make bleeding more likely. Your health care provider can give you  recommendations on this.  Resume normal activities as you are able, but try to avoid straining, lifting, or bending at the waist for several days.  If the nosebleeds become recurrent and the cause is unknown, your health care provider may suggest laboratory tests. SEEK MEDICAL CARE IF: You have a fever. SEEK IMMEDIATE MEDICAL CARE IF:   Bleeding recurs and cannot be controlled.  There is unusual bleeding from or bruising on other parts of the body.  Nosebleeds continue.  There is any worsening of the condition which originally brought you in.  You become light-headed, feel faint, become sweaty, or vomit blood. MAKE SURE YOU:   Understand these instructions.  Will watch your condition.  Will get help right away if you are not doing well or get worse. Document Released: 03/20/2005 Document Revised: 10/25/2013 Document Reviewed: 05/11/2009 [facility] Patient Information 2015 [place], [place]. This information is not intended to replace advice given to you by your health care provider. Make sure you discuss any questions you have with your health care provider. Thrombocytopenia Thrombocytopenia is a condition in which there is an abnormally small number of platelets in your blood. Platelets are also called thrombocytes. Platelets are needed for blood clotting. CAUSES Thrombocytopenia is caused by:   Decreased production of platelets. This can be caused by:  Aplastic anemia in which your bone marrow quits making blood cells.  Cancer in the bone marrow.  Use of certain medicines, including chemotherapy.  Infection in the bone marrow.  Heavy alcohol consumption.  Increased destruction  of platelets. This can be caused by:  Certain immune diseases.  Use of certain drugs.  Certain blood clotting disorders.  Certain inherited disorders.  Certain bleeding disorders.  Pregnancy.  Having an enlarged spleen (hypersplenism). In hypersplenism, the spleen gathers up platelets  from circulation. This means the platelets are not available to help with blood clotting. The spleen can enlarge due to cirrhosis or other conditions. SYMPTOMS  The symptoms of thrombocytopenia are side effects of poor blood clotting. Some of these are:  Abnormal bleeding.  Nosebleeds.  Heavy menstrual periods.  Blood in the urine or stools.  Purpura. This is a purplish discoloration in the skin produced by small bleeding vessels near the surface of the skin.  Bruising.  A rash that may be petechial. This looks like pinpoint, purplish-red spots on the skin and mucous membranes. It is caused by bleeding from small blood vessels (capillaries). DIAGNOSIS  Your caregiver will make this diagnosis based on your exam and blood tests. Sometimes, a bone marrow study is done to look for the original cells (megakaryocytes) that make platelets. TREATMENT  Treatment depends on the cause of the condition.  Medicines may be given to help protect your platelets from being destroyed.  In some cases, a replacement (transfusion) of platelets may be required to stop or prevent bleeding.  Sometimes, the spleen must be surgically removed. HOME CARE INSTRUCTIONS   Check the skin and linings inside your mouth for bruising or bleeding as directed by your caregiver.  Check your sputum, urine, and stool for blood as directed by your caregiver.  Do not return to any activities that could cause bumps or bruises until your caregiver says it is okay.  Take extra care not to cut yourself when shaving or when using scissors, needles, knives, and other tools.  Take extra care not to burn yourself when ironing or cooking.  Ask your caregiver if it is okay for you to drink alcohol.  Only take over-the-counter or prescription medicines as directed by your caregiver.  Notify all your caregivers, including dentists and eye doctors, about your condition. SEEK IMMEDIATE MEDICAL CARE IF:   You develop active  bleeding from anywhere in your body.  You develop unexplained bruising or bleeding.  You have blood in your sputum, urine, or stool. MAKE SURE YOU:  Understand these instructions.  Will watch your condition.  Will get help right away if you are not doing well or get worse. Document Released: 06/10/2005 Document Revised: 09/02/2011 Document Reviewed: 04/12/2011 [facility] Patient Information 2015 [place], [place]. This information is not intended to replace advice given to you by your health care provider. Make sure you discuss any questions you have with your health care provider.

## 2014-11-22 NOTE — ED Provider Notes (Signed)
CSN: [id number]     Arrival date & time 11/22/14  0002 History  This chart was scribed for Derwood Kaplan, MD by Annye Asa, ED Scribe. This patient was seen in room B19C/B19C and the patient's care was started at 1:16 AM.    Chief Complaint  Patient presents with  . Epistaxis  . Post-op Problem   The history is provided by the patient. No language interpreter was used.     HPI Comments: Caroline Brooks is a 24 y.o. female who presents to the Emergency Department complaining of post-op problem. Patient explains that after her cesarean section on 5/27 she has had low platelets (lowest 35); she was discharged from [facility] today after improvement but told to return if she had any bleeding. She reports that she had some epistaxis from bilateral nares tonight, described as "pea-sized clots" without free-flowing blood, prompting her to come into the ED. Patient states she had frequent nosebleeds as a child but has "grown out of it."  She also reports upper abdominal pain; she explains that this pain has not worsened with time, but has impacted her ability to pass gas or have a BM.  She denies heavy vaginal bleeding.   Prior surgical history includes three total cesarean sections; no complications.   Past Medical History  Diagnosis Date  . Pregnant 04/28/2014   Past Surgical History  Procedure Laterality Date  . Cesarean section    . Cesarean section  11/26/2011    Procedure: CESAREAN SECTION;  Surgeon: Lazaro Arms, MD;  Location: [facility];  Service: Gynecology;  Laterality: N/A;  . Cesarean section N/A 11/18/2014    Procedure: CESAREAN SECTION;  Surgeon: Lazaro Arms, MD;  Location: [facility];  Service: Obstetrics;  Laterality: N/A;  . Bilateral salpingectomy Bilateral 11/18/2014    Procedure: BILATERAL SALPINGECTOMY;  Surgeon: Lazaro Arms, MD;  Location: [facility];  Service: Obstetrics;  Laterality: Bilateral;   Family History  Problem Relation Age of Onset  . Cancer Mother 65   breast   . Hypertension Father   . Diabetes Father   . Other Brother     Alpha 1  . Cancer Maternal Grandfather     prostate  . Hypertension Maternal Grandfather   . Early death Paternal Grandfather   . Alcohol abuse Paternal Grandfather    History  Substance Use Topics  . Smoking status: Former Smoker -- 0.50 packs/day for 5 years    Types: Cigarettes  . Smokeless tobacco: Never Used  . Alcohol Use: No     Comment: occ   OB History    Gravida Para Term Preterm AB TAB SAB Ectopic Multiple Living   0 0 0 0 0 0 2     Review of Systems  All other systems reviewed and are negative.     Allergies  Review of patient's allergies indicates no known allergies.  Home Medications   Prior to Admission medications   Medication Sig Start Date End Date Taking? Authorizing Provider  acetaminophen (TYLENOL) 325 MG tablet Take 2 tablets (650 mg total) by mouth every 4 (four) hours as needed (for pain scale < 4). Patient not taking: Reported on 11/25/2014 11/21/14  Yes Ethelda Chick, MD  calcium carbonate (TUMS - DOSED IN MG ELEMENTAL CALCIUM) 500 MG chewable tablet Chew 2 tablets by mouth 3 (three) times daily as needed for indigestion or heartburn.   Yes Historical Provider, MD  escitalopram (LEXAPRO) 20 MG tablet Take 1 tablet (20  mg total) by mouth daily. 08/18/14  Yes Lazaro ArmsLuther H Eure, MD  oxyCODONE (OXY IR/ROXICODONE) 5 MG immediate release tablet Take 1 tablet (5 mg total) by mouth every 4 (four) hours as needed for severe pain. 11/21/14  Yes Ethelda Chickaroline Roberts, MD  Pediatric Multivit-Minerals-C (FLINTSTONES GUMMIES PO) Take 2 each by mouth daily.   Yes Historical Provider, MD  senna-docusate (SENOKOT-S) 8.6-50 MG per tablet Take 2 tablets by mouth daily. Patient not taking: Reported on 11/25/2014 11/21/14  Yes Ethelda Chickaroline Roberts, MD  oxyCODONE-acetaminophen (PERCOCET/ROXICET) 5-325 MG per tablet Take 1 tablet by mouth every 4 (four) hours as needed for severe pain. 11/25/14   Lazaro ArmsLuther H Eure,  MD   BP 132/71 mmHg  Pulse 79  Temp(Src) 98.6 F (37 C) (Oral)  Resp 21  Ht 5\' 9"  (1.753 m)  Wt 168 lb (76.204 kg)  BMI 24.80 kg/m2  SpO2 99%  LMP 03/11/2014 (Approximate) Physical Exam  Constitutional: She is oriented to person, place, and time. She appears well-developed and well-nourished. No distress.  HENT:  Head: Normocephalic and atraumatic.  Fresh clot on the anterior nares bilaterally  Eyes: EOM are normal. Pupils are equal, round, and reactive to light.  Neck: Normal range of motion. Neck supple. No JVD present.  Cardiovascular: Normal rate, regular rhythm and normal heart sounds.  Exam reveals no gallop and no friction rub.   No murmur heard. Pulmonary/Chest: Effort normal and breath sounds normal. No respiratory distress. She has no wheezes. She has no rales.  Abdominal: Soft. Bowel sounds are normal. She exhibits no mass. There is tenderness. There is no rebound and no guarding.  Ecchymoses inferior to csection incision, demarcated by the patient. Lower quadrant tenderness.  Musculoskeletal: Normal range of motion. She exhibits no edema.  Moves all extremities normally.   Lymphadenopathy:    She has no cervical adenopathy.  Neurological: She is alert and oriented to person, place, and time.  Skin: Skin is warm and dry. Rash noted.  Psychiatric: She has a normal mood and affect. Her behavior is normal.  Nursing note and vitals reviewed.   ED Course  Procedures   DIAGNOSTIC STUDIES: Oxygen Saturation is 98% on RA, normal by my interpretation.    COORDINATION OF CARE: 1:31 AM Discussed treatment plan with pt at bedside and pt agreed to plan.   Labs Review Labs Reviewed - No data to display  Imaging Review No results found.   EKG Interpretation None      MDM   Final diagnoses:  Anterior epistaxis    Pt with recent csection and thrombocytopenia comes in with cc of abd pain and nose bleeding. Abd pain - constant and same. Discharged today. She has  a small ecchymoses to the abdomen. There is no clinical concerns for severe intraabdominal hemorrhage. CBC offered - to look at her Hb and platelets - but pt has a f/u with her gyne in a matter of few hours and she prefers outpatient workup. Epistaxis - resolved. Went over the return precautions.     Derwood KaplanAnkit Donal Lynam, MD 11/27/14 1610

## 2014-11-23 ENCOUNTER — Encounter: Payer: Medicaid Other | Admitting: Obstetrics & Gynecology

## 2014-11-25 ENCOUNTER — Ambulatory Visit (INDEPENDENT_AMBULATORY_CARE_PROVIDER_SITE_OTHER): Payer: Medicaid Other | Admitting: Obstetrics & Gynecology

## 2014-11-25 ENCOUNTER — Encounter: Payer: Self-pay | Admitting: Obstetrics & Gynecology

## 2014-11-25 VITALS — BP 138/80 | HR 88 | Wt 162.0 lb

## 2014-11-25 DIAGNOSIS — Z9889 Other specified postprocedural states: Secondary | ICD-10-CM

## 2014-11-25 DIAGNOSIS — D696 Thrombocytopenia, unspecified: Secondary | ICD-10-CM

## 2014-11-25 MED ORDER — OXYCODONE-ACETAMINOPHEN 5-325 MG PO TABS: 1.0000 | ORAL_TABLET | ORAL | Status: DC | PRN

## 2014-11-25 NOTE — Progress Notes (Signed)
Patient ID: Caroline Brooks, female   DOB: Mar 16, 1991, 24 y.o.   MRN: [id number]   HPI: Patient returns for routine postoperative follow-up having undergone repeat Caesarean section on 5/27.  The patient's immediate postoperative recovery has been unremarkable. Since hospital discharge the patient reports more sorenes and pain than previous.   Current Outpatient Prescriptions: escitalopram (LEXAPRO) 20 MG tablet, Take 1 tablet (20 mg total) by mouth daily., Disp: 30 tablet, Rfl: 4 oxyCODONE (OXY IR/ROXICODONE) 5 MG immediate release tablet, Take 1 tablet (5 mg total) by mouth every 4 (four) hours as needed for severe pain., Disp: 30 tablet, Rfl: 0 acetaminophen (TYLENOL) 325 MG tablet, Take 2 tablets (650 mg total) by mouth every 4 (four) hours as needed (for pain scale < 4). (Patient not taking: Reported on 11/25/2014), Disp: 30 tablet, Rfl: 1 calcium carbonate (TUMS - DOSED IN MG ELEMENTAL CALCIUM) 500 MG chewable tablet, Chew 2 tablets by mouth 3 (three) times daily as needed for indigestion or heartburn., Disp: , Rfl:  Pediatric Multivit-Minerals-C (FLINTSTONES GUMMIES PO), Take 2 each by mouth daily., Disp: , Rfl:  senna-docusate (SENOKOT-S) 8.6-50 MG per tablet, Take 2 tablets by mouth daily. (Patient not taking: Reported on 11/25/2014), Disp: 30 tablet, Rfl: 1  No current facility-administered medications for this visit.    Blood pressure 138/80, pulse 88, weight 162 lb (73.483 kg), last menstrual period 03/11/2014, unknown if currently breastfeeding.  Physical Exam: Incision clean dry intact abdomen is soft normalpost op  Diagnostic Tests: CBC  Pathology: benign  Impression: S/p repeat caesarean section and distal salpingectomy  Plan: Check platelets today  Follow up: 4  weeks post partum  Caroline Brooks,Caroline Carreras H, MD

## 2014-11-25 NOTE — Progress Notes (Signed)
Post discharge chart review completed.  

## 2014-11-29 LAB — CBC
Hematocrit: 33.4 % — ABNORMAL LOW (ref 34.0–46.6)
Hemoglobin: 11.1 g/dL (ref 11.1–15.9)
MCH: 30.1 pg (ref 26.6–33.0)
MCHC: 33.2 g/dL (ref 31.5–35.7)
MCV: 91 fL (ref 79–97)
Platelets: 83 10*3/uL — CL (ref 150–379)
RBC: 3.69 x10E6/uL — ABNORMAL LOW (ref 3.77–5.28)
RDW: 13.7 % (ref 12.3–15.4)
WBC: 9.2 10*3/uL (ref 3.4–10.8)

## 2014-12-23 ENCOUNTER — Encounter: Payer: Self-pay | Admitting: Obstetrics & Gynecology

## 2014-12-23 ENCOUNTER — Ambulatory Visit: Payer: Medicaid Other | Admitting: Obstetrics & Gynecology

## 2015-01-04 ENCOUNTER — Encounter: Payer: Self-pay | Admitting: Obstetrics & Gynecology

## 2016-03-16 ENCOUNTER — Encounter (HOSPITAL_COMMUNITY): Payer: Self-pay | Admitting: Emergency Medicine

## 2016-03-16 ENCOUNTER — Emergency Department (HOSPITAL_COMMUNITY)
Admission: EM | Admit: 2016-03-16 | Discharge: 2016-03-16 | Disposition: A | Discharge status: Home / Self Care | Payer: Medicaid Other | Attending: Emergency Medicine | Admitting: Emergency Medicine

## 2016-03-16 ENCOUNTER — Emergency Department (HOSPITAL_COMMUNITY): Payer: Medicaid Other

## 2016-03-16 DIAGNOSIS — D696 Thrombocytopenia, unspecified: Secondary | ICD-10-CM | POA: Insufficient documentation

## 2016-03-16 DIAGNOSIS — F10120 Alcohol abuse with intoxication, uncomplicated: Secondary | ICD-10-CM | POA: Insufficient documentation

## 2016-03-16 DIAGNOSIS — Z79899 Other long term (current) drug therapy: Secondary | ICD-10-CM | POA: Insufficient documentation

## 2016-03-16 DIAGNOSIS — F1210 Cannabis abuse, uncomplicated: Secondary | ICD-10-CM | POA: Insufficient documentation

## 2016-03-16 DIAGNOSIS — F1290 Cannabis use, unspecified, uncomplicated: Secondary | ICD-10-CM

## 2016-03-16 DIAGNOSIS — R55 Syncope and collapse: Secondary | ICD-10-CM | POA: Insufficient documentation

## 2016-03-16 DIAGNOSIS — F17210 Nicotine dependence, cigarettes, uncomplicated: Secondary | ICD-10-CM | POA: Insufficient documentation

## 2016-03-16 DIAGNOSIS — F10920 Alcohol use, unspecified with intoxication, uncomplicated: Secondary | ICD-10-CM

## 2016-03-16 HISTORY — DX: Thrombocytopenia, unspecified: D69.6

## 2016-03-16 HISTORY — DX: Opioid abuse, uncomplicated: F11.10

## 2016-03-16 HISTORY — DX: Other diseases of the blood and blood-forming organs and certain disorders involving the immune mechanism complicating pregnancy, unspecified trimester: O99.119

## 2016-03-16 HISTORY — DX: Other secondary thrombocytopenia: D69.59

## 2016-03-16 HISTORY — DX: Alcohol abuse, uncomplicated: F10.10

## 2016-03-16 LAB — PREGNANCY, URINE: Preg Test, Ur: NEGATIVE

## 2016-03-16 LAB — CBC WITH DIFFERENTIAL/PLATELET
BASOS PCT: 1 %
Basophils Absolute: 0.1 10*3/uL (ref 0.0–0.1)
EOS ABS: 0.1 10*3/uL (ref 0.0–0.7)
Eosinophils Relative: 1 %
HCT: 40.9 % (ref 36.0–46.0)
HEMOGLOBIN: 13.5 g/dL (ref 12.0–15.0)
Lymphocytes Relative: 51 %
Lymphs Abs: 3 10*3/uL (ref 0.7–4.0)
MCH: 29.3 pg (ref 26.0–34.0)
MCHC: 33 g/dL (ref 30.0–36.0)
MCV: 88.7 fL (ref 78.0–100.0)
Monocytes Absolute: 0.3 10*3/uL (ref 0.1–1.0)
Monocytes Relative: 6 %
NEUTROS PCT: 42 %
Neutro Abs: 2.4 10*3/uL (ref 1.7–7.7)
Platelets: 47 10*3/uL — ABNORMAL LOW (ref 150–400)
RBC: 4.61 MIL/uL (ref 3.87–5.11)
RDW: 14.9 % (ref 11.5–15.5)
WBC: 5.8 10*3/uL (ref 4.0–10.5)

## 2016-03-16 LAB — URINALYSIS, ROUTINE W REFLEX MICROSCOPIC
Bilirubin Urine: NEGATIVE
Glucose, UA: NEGATIVE mg/dL
Hgb urine dipstick: NEGATIVE
Ketones, ur: NEGATIVE mg/dL
Leukocytes, UA: NEGATIVE
NITRITE: NEGATIVE
PH: 6 (ref 5.0–8.0)
Protein, ur: NEGATIVE mg/dL
Specific Gravity, Urine: <1.005 — ABNORMAL LOW (ref 1.005–1.030)

## 2016-03-16 LAB — COMPREHENSIVE METABOLIC PANEL
ALK PHOS: 86 U/L (ref 38–126)
ALT: 15 U/L (ref 14–54)
ANION GAP: 8 (ref 5–15)
AST: 18 U/L (ref 15–41)
Albumin: 4.6 g/dL (ref 3.5–5.0)
BILIRUBIN TOTAL: 0.6 mg/dL (ref 0.3–1.2)
BUN: 5 mg/dL — ABNORMAL LOW (ref 6–20)
CALCIUM: 8.9 mg/dL (ref 8.9–10.3)
CO2: 26 mmol/L (ref 22–32)
Chloride: 107 mmol/L (ref 101–111)
Creatinine, Ser: 0.83 mg/dL (ref 0.44–1.00)
Glucose, Bld: 112 mg/dL — ABNORMAL HIGH (ref 65–99)
POTASSIUM: 3.3 mmol/L — AB (ref 3.5–5.1)
Sodium: 141 mmol/L (ref 135–145)
TOTAL PROTEIN: 7.4 g/dL (ref 6.5–8.1)

## 2016-03-16 LAB — ETHANOL: ALCOHOL ETHYL (B): 300 mg/dL — AB (ref <5)

## 2016-03-16 LAB — SALICYLATE LEVEL

## 2016-03-16 LAB — ACETAMINOPHEN LEVEL: Acetaminophen (Tylenol), Serum: <10 ug/mL — ABNORMAL LOW (ref 10–30)

## 2016-03-16 LAB — RAPID URINE DRUG SCREEN, HOSP PERFORMED
AMPHETAMINES: NOT DETECTED
Barbiturates: NOT DETECTED
Benzodiazepines: NOT DETECTED
Cocaine: NOT DETECTED
OPIATES: NOT DETECTED
TETRAHYDROCANNABINOL: POSITIVE — AB

## 2016-03-16 MED ORDER — SODIUM CHLORIDE 0.9 % IV SOLN: INTRAVENOUS | Status: DC

## 2016-03-16 MED ORDER — SODIUM CHLORIDE 0.9 % IV BOLUS (SEPSIS)
1000.0000 mL | Freq: Once | INTRAVENOUS | Status: AC
  Administered 2016-03-16: 1000 mL via INTRAVENOUS

## 2016-03-16 NOTE — ED Notes (Signed)
Pt's 02 dropped in the low 70's. Pt's dark purple fingernail polish removed and o2 rose to the high 80's. Pt placed on 2L of o2. Pt's o2 now 100%.

## 2016-03-16 NOTE — ED Notes (Signed)
Patient transported to CT 

## 2016-03-16 NOTE — ED Notes (Signed)
Iv in right ac removed by pt, pt assisted back to bed, band aid applied,

## 2016-03-16 NOTE — ED Provider Notes (Signed)
[facility]-EMERGENCY DEPT Provider Note   CSN: [id number] Arrival date & time: 03/16/16  1904     History   Chief Complaint Chief Complaint  Patient presents with  . Alcohol Intoxication    HPI Caroline Brooks is a 25 y.o. female.  The history is provided by the patient and the spouse. The history is limited by the condition of the patient (intoxicated).  Alcohol Intoxication   Pt was seen at 1915. Per pt's spouse: States he came home and found pt "unconscious on the floor." Pt told him she "drank about a pint today." Pt's spouse states they then argued, she went into the bathroom, shut the door, and "she passed out again." He states he "thinks she may have taken opiates too" because "she's had a problem before with that." He states he thinks that "because she's not acting like she's just drunk on alcohol." Pt herself is tearful and not forthcoming regarding events today.   Past Medical History:  Diagnosis Date  . Alcohol abuse   . Maternal alloimmune thrombocytopenia complicating pregnancy   . Narcotic abuse   . Thrombocytopenia [facility](HCC)     Patient Active Problem List   Diagnosis Date Noted  . Thrombocytopenia affecting pregnancy, antepartum (HCC) 11/18/2014  . Elevated BP 11/09/2014  . Hx of preeclampsia, prior pregnancy, currently pregnant 10/19/2014  . Previous cesarean delivery, antepartum 10/19/2014  . Depression affecting pregnancy, antepartum 10/19/2014  . Marijuana use 10/19/2014  . Supervision of normal pregnancy 04/28/2014    Past Surgical History:  Procedure Laterality Date  . BILATERAL SALPINGECTOMY Bilateral 11/18/2014   Procedure: BILATERAL SALPINGECTOMY;  Surgeon: Lazaro ArmsLuther H Eure, MD;  Location: [facility];  Service: Obstetrics;  Laterality: Bilateral;  . CESAREAN SECTION    . CESAREAN SECTION  11/26/2011   Procedure: CESAREAN SECTION;  Surgeon: Lazaro ArmsLuther H Eure, MD;  Location: [facility];  Service: Gynecology;  Laterality: N/A;  . CESAREAN SECTION N/A 11/18/2014   Procedure: CESAREAN SECTION;  Surgeon: Lazaro ArmsLuther H Eure, MD;  Location: [facility];  Service: Obstetrics;  Laterality: N/A;    OB History    Gravida Para Term Preterm AB Living   3 3 3  0 0 3   SAB TAB Ectopic Multiple Live Births   0 0 0 0 2       Home Medications    Prior to Admission medications   Medication Sig Start Date End Date Taking? Authorizing Provider  acetaminophen (TYLENOL) 325 MG tablet Take 2 tablets (650 mg total) by mouth every 4 (four) hours as needed (for pain scale < 4). Patient not taking: Reported on 11/25/2014 11/21/14   Ethelda Chickaroline Roberts, MD  calcium carbonate (TUMS - DOSED IN MG ELEMENTAL CALCIUM) 500 MG chewable tablet Chew 2 tablets by mouth 3 (three) times daily as needed for indigestion or heartburn.    Historical Provider, MD  escitalopram (LEXAPRO) 20 MG tablet Take 1 tablet (20 mg total) by mouth daily. 08/18/14   Lazaro ArmsLuther H Eure, MD  oxyCODONE (OXY IR/ROXICODONE) 5 MG immediate release tablet Take 1 tablet (5 mg total) by mouth every 4 (four) hours as needed for severe pain. 11/21/14   Ethelda Chickaroline Roberts, MD  oxyCODONE-acetaminophen (PERCOCET/ROXICET) 5-325 MG per tablet Take 1 tablet by mouth every 4 (four) hours as needed for severe pain. 11/25/14   Lazaro ArmsLuther H Eure, MD  Pediatric Multivit-Minerals-C (FLINTSTONES GUMMIES PO) Take 2 each by mouth daily.    Historical Provider, MD  senna-docusate (SENOKOT-S) 8.6-50 MG per tablet Take 2 tablets by mouth daily.  Patient not taking: Reported on 11/25/2014 11/21/14   Ethelda Chick, MD    Family History Family History  Problem Relation Age of Onset  . Cancer Mother 72    breast   . Hypertension Father   . Diabetes Father   . Other Brother     Alpha 1  . Cancer Maternal Grandfather     prostate  . Hypertension Maternal Grandfather   . Early death Paternal Grandfather   . Alcohol abuse Paternal Grandfather     Social History Social History  Substance Use Topics  . Smoking status: Current Every Day Smoker     Packs/day: 0.50    Years: 5.00    Types: Cigarettes  . Smokeless tobacco: Never Used  . Alcohol use Yes     Comment: pint today     Allergies   Review of patient's allergies indicates no known allergies.   Review of Systems Review of Systems  Unable to perform ROS: Other     Physical Exam Updated Vital Signs BP (!) 112/54 (BP Location: Left Arm)   Pulse 70   Temp 97.7 F (36.5 C) (Oral)   Resp 18   Ht 5\' 9"  (1.753 m)   Wt 150 lb (68 kg)   LMP 03/16/2016   SpO2 100%   BMI 22.15 kg/m   Physical Exam 1920: Physical examination:  Nursing notes reviewed; Vital signs and O2 SAT reviewed;  Constitutional: Well developed, Well nourished, Well hydrated, In no acute distress; Head:  Normocephalic, atraumatic.; Eyes: EOMI, PERRL, No scleral icterus; ENMT: +abrasion to right lower lip. Mouth and pharynx normal, Mucous membranes moist; Neck: Supple, Full range of motion, No lymphadenopathy; Cardiovascular: Regular rate and rhythm, No gallop; Respiratory: Breath sounds clear & equal bilaterally, No wheezes.  Speaking full sentences with ease, Normal respiratory effort/excursion; Chest: Nontender, Movement normal; Abdomen: Soft, Nontender, Nondistended, Normal bowel sounds; Genitourinary: No CVA tenderness; Extremities: Pulses normal, No tenderness, No edema, No calf edema or asymmetry.; Neuro: Awake, alert. Speech slurred. No facial droop. Moves all extremities spontaneously without apparent gross focal motor deficits.; Skin: Color normal, Warm, Dry.; Psych:  Tearful, poor eye contact.    ED Treatments / Results  Labs (all labs ordered are listed, but only abnormal results are displayed)   EKG  EKG Interpretation  Date/Time:  Saturday March 16 2016 19:41:18 EDT Ventricular Rate:  71 PR Interval:    QRS Duration: 101 QT Interval:  430 QTC Calculation: 468 R Axis:   84 Text Interpretation:  Sinus rhythm Artifact No old tracing to compare Confirmed by [facility]  MD, Nicholos Johns  [id number]) on 03/16/2016 7:47:14 PM       Radiology   Procedures Procedures (including critical care time)  Medications Ordered in ED Medications  sodium chloride 0.9 % bolus 1,000 mL (not administered)  0.9 %  sodium chloride infusion (not administered)     Initial Impression / Assessment and Plan / ED Course  I have reviewed the triage vital signs and the nursing notes.  Pertinent labs & imaging results that were available during my care of the patient were reviewed by me and considered in my medical decision making (see chart for details).  MDM Reviewed: previous chart, nursing note and vitals Reviewed previous: labs Interpretation: labs and CT scan   Results for orders placed or performed during the hospital encounter of 03/16/16  Acetaminophen level  Result Value Ref Range   Acetaminophen (Tylenol), Serum <10 (L) 10 - 30 ug/mL  Comprehensive metabolic panel  Result  Value Ref Range   Sodium 141 135 - 145 mmol/L   Potassium 3.3 (L) 3.5 - 5.1 mmol/L   Chloride 107 101 - 111 mmol/L   CO2 26 22 - 32 mmol/L   Glucose, Bld 112 (H) 65 - 99 mg/dL   BUN 5 (L) 6 - 20 mg/dL   Creatinine, Ser 1.61 0.44 - 1.00 mg/dL   Calcium 8.9 8.9 - 09.6 mg/dL   Total Protein 7.4 6.5 - 8.1 g/dL   Albumin 4.6 3.5 - 5.0 g/dL   AST 18 15 - 41 U/L   ALT 15 14 - 54 U/L   Alkaline Phosphatase 86 38 - 126 U/L   Total Bilirubin 0.6 0.3 - 1.2 mg/dL   GFR calc non Af Amer >60 >60 mL/min   GFR calc Af Amer >60 >60 mL/min   Anion gap 8 5 - 15  Ethanol  Result Value Ref Range   Alcohol, Ethyl (B) 300 (H) <5 mg/dL  Salicylate level  Result Value Ref Range   Salicylate Lvl <4.0 2.8 - 30.0 mg/dL  CBC with Differential  Result Value Ref Range   WBC 5.8 4.0 - 10.5 K/uL   RBC 4.61 3.87 - 5.11 MIL/uL   Hemoglobin 13.5 12.0 - 15.0 g/dL   HCT 04.5 40.9 - 81.1 %   MCV 88.7 78.0 - 100.0 fL   MCH 29.3 26.0 - 34.0 pg   MCHC 33.0 30.0 - 36.0 g/dL   RDW 91.4 78.2 - 95.6 %   Platelets 47 (L) 150 - 400  K/uL   Neutrophils Relative % 42 %   Neutro Abs 2.4 1.7 - 7.7 K/uL   Lymphocytes Relative 51 %   Lymphs Abs 3.0 0.7 - 4.0 K/uL   Monocytes Relative 6 %   Monocytes Absolute 0.3 0.1 - 1.0 K/uL   Eosinophils Relative 1 %   Eosinophils Absolute 0.1 0.0 - 0.7 K/uL   Basophils Relative 1 %   Basophils Absolute 0.1 0.0 - 0.1 K/uL  Urine rapid drug screen (hosp performed)  Result Value Ref Range   Opiates NONE DETECTED NONE DETECTED   Cocaine NONE DETECTED NONE DETECTED   Benzodiazepines NONE DETECTED NONE DETECTED   Amphetamines NONE DETECTED NONE DETECTED   Tetrahydrocannabinol POSITIVE (A) NONE DETECTED   Barbiturates NONE DETECTED NONE DETECTED  Pregnancy, urine  Result Value Ref Range   Preg Test, Ur NEGATIVE NEGATIVE  Urinalysis, Routine w reflex microscopic  Result Value Ref Range   Color, Urine YELLOW YELLOW   APPearance CLEAR CLEAR   Specific Gravity, Urine <1.005 (L) 1.005 - 1.030   pH 6.0 5.0 - 8.0   Glucose, UA NEGATIVE NEGATIVE mg/dL   Hgb urine dipstick NEGATIVE NEGATIVE   Bilirubin Urine NEGATIVE NEGATIVE   Ketones, ur NEGATIVE NEGATIVE mg/dL   Protein, ur NEGATIVE NEGATIVE mg/dL   Nitrite NEGATIVE NEGATIVE   Leukocytes, UA NEGATIVE NEGATIVE   Ct Head Wo Contrast Result Date: 03/16/2016 CLINICAL DATA:  Altered level consciousness, fall EXAM: CT HEAD WITHOUT CONTRAST TECHNIQUE: Contiguous axial images were obtained from the base of the skull through the vertex without intravenous contrast. COMPARISON:  CT head dated 08/18/2008 FINDINGS: Brain: No evidence of acute infarction, hemorrhage, hydrocephalus, extra-axial collection or mass lesion/mass effect. Vascular: No hyperdense vessel or unexpected calcification. Skull: Normal. Negative for fracture or focal lesion. Sinuses/Orbits: The visualized paranasal sinuses are essentially clear. The mastoid air cells are unopacified. Other: Cerebral volume is within normal limits. No ventriculomegaly. IMPRESSION: Normal head CT.  Electronically Signed  By: Charline Bills M.D.   On: 03/16/2016 20:15    2130:  Thrombocytopenic per hx. Pt has ambulated around her exam room, is awake/alert, NAD. Family wants to take her home now. Pt and family aware of continued low platelets post-pregnancy; encouraged to f/u with Heme/Onc MD. Dx and testing d/w pt and family.  Questions answered.  Verb understanding, agreeable to d/c home with outpt f/u.     Final Clinical Impressions(s) / ED Diagnoses   Final diagnoses:  None    New Prescriptions New Prescriptions   No medications on file     Samuel Jester, DO 03/19/16 1800

## 2016-03-16 NOTE — ED Triage Notes (Signed)
Pt reports drinking about a pint today. SO states he came home and found her unconscious on the floor.

## 2016-03-16 NOTE — Discharge Instructions (Signed)
Take your usual prescriptions as previously directed.  Call your regular medical doctor and the Hematologist/Oncologist on Monday to schedule a follow up appointment this week to follow up your low platelets.  Return to the Emergency Department immediately sooner if worsening.

## 2016-03-27 ENCOUNTER — Encounter (HOSPITAL_COMMUNITY): Payer: Medicaid Other | Attending: Hematology | Admitting: Hematology

## 2016-03-27 ENCOUNTER — Encounter (HOSPITAL_COMMUNITY): Payer: Self-pay | Admitting: Hematology

## 2016-03-27 ENCOUNTER — Encounter (HOSPITAL_COMMUNITY): Payer: Medicaid Other

## 2016-03-27 VITALS — HR 88 | Temp 98.3°F | Resp 18 | Ht 69.0 in | Wt 149.1 lb

## 2016-03-27 DIAGNOSIS — D696 Thrombocytopenia, unspecified: Secondary | ICD-10-CM

## 2016-03-27 DIAGNOSIS — F1910 Other psychoactive substance abuse, uncomplicated: Secondary | ICD-10-CM | POA: Diagnosis not present

## 2016-03-27 DIAGNOSIS — F39 Unspecified mood [affective] disorder: Secondary | ICD-10-CM | POA: Diagnosis not present

## 2016-03-27 LAB — COMPREHENSIVE METABOLIC PANEL
ALBUMIN: 4.5 g/dL (ref 3.5–5.0)
ALK PHOS: 82 U/L (ref 38–126)
ALT: 13 U/L — ABNORMAL LOW (ref 14–54)
ANION GAP: 5 (ref 5–15)
AST: 15 U/L (ref 15–41)
BILIRUBIN TOTAL: 0.5 mg/dL (ref 0.3–1.2)
BUN: 7 mg/dL (ref 6–20)
CALCIUM: 9.3 mg/dL (ref 8.9–10.3)
CO2: 29 mmol/L (ref 22–32)
Chloride: 104 mmol/L (ref 101–111)
Creatinine, Ser: 0.63 mg/dL (ref 0.44–1.00)
GLUCOSE: 94 mg/dL (ref 65–99)
POTASSIUM: 4.1 mmol/L (ref 3.5–5.1)
Sodium: 138 mmol/L (ref 135–145)
TOTAL PROTEIN: 7.4 g/dL (ref 6.5–8.1)

## 2016-03-27 LAB — TSH: TSH: 0.785 u[IU]/mL (ref 0.350–4.500)

## 2016-03-27 LAB — CBC WITH DIFFERENTIAL/PLATELET
Basophils Absolute: 0 10*3/uL (ref 0.0–0.1)
Basophils Relative: 0 %
EOS ABS: 0 10*3/uL (ref 0.0–0.7)
Eosinophils Relative: 0 %
HEMATOCRIT: 41.8 % (ref 36.0–46.0)
HEMOGLOBIN: 13.5 g/dL (ref 12.0–15.0)
LYMPHS ABS: 2.4 10*3/uL (ref 0.7–4.0)
Lymphocytes Relative: 27 %
MCH: 29 pg (ref 26.0–34.0)
MCHC: 32.3 g/dL (ref 30.0–36.0)
MCV: 89.7 fL (ref 78.0–100.0)
MONO ABS: 0.5 10*3/uL (ref 0.1–1.0)
MONOS PCT: 6 %
NEUTROS ABS: 5.7 10*3/uL (ref 1.7–7.7)
NEUTROS PCT: 66 %
Platelets: 53 10*3/uL — ABNORMAL LOW (ref 150–400)
RBC: 4.66 MIL/uL (ref 3.87–5.11)
RDW: 14.7 % (ref 11.5–15.5)
WBC: 8.6 10*3/uL (ref 4.0–10.5)

## 2016-03-27 LAB — VITAMIN B12: Vitamin B-12: 457 pg/mL (ref 180–914)

## 2016-03-27 LAB — RETICULOCYTES
RBC.: 4.66 MIL/uL (ref 3.87–5.11)
RETIC COUNT ABSOLUTE: 55.9 10*3/uL (ref 19.0–186.0)
Retic Ct Pct: 1.2 % (ref 0.4–3.1)

## 2016-03-27 LAB — SEDIMENTATION RATE: SED RATE: 10 mm/h (ref 0–22)

## 2016-03-27 LAB — LACTATE DEHYDROGENASE: LDH: 130 U/L (ref 98–192)

## 2016-03-27 NOTE — Progress Notes (Signed)
[name].    HEMATOLOGY/ONCOLOGY CONSULTATION NOTE  Date of Service: 03/27/2016  Patient Care Team: [facility] as PCP - General  CHIEF COMPLAINTS/PURPOSE OF CONSULTATION:  Thrombocytopenia  HISTORY OF PRESENTING ILLNESS:   Caroline Brooks is a  25 y.o. female who has been referred to us by [facility]  for evaluation and management of thrombocytopenia.  Patient has a history of depression/anxiety, alcohol binging, polysubstance abuse including the use of heroin and marijuana, history of concussion in her teens who has been referred for evaluation of thrombocytopenia. Patient reports easy bruisability for several years. Had epistaxis as a child but not recently-she was told this was related to sinus problems. No GI bleeding no joint bleeds no CNS plates. She notes she's been having episodes of feeling like she would pass out. She had an ED visit on 03/16/2016 after passing out after consuming alcohol , using marijuana and possibly something else. CT head was negative at that time with no bleeds.  No known history of personal liver disease . Notes her half-brother had a history of also 1 antitrypsin deficiency . She has had 3 C-sections in the past without any issues with bleeding . Her first pregnancy was 6 years ago and was complicated by preeclampsia . She notes that her second pregnancy about 4 years ago - she was told that she had somewhat low platelets but they were not concerning and she had an uneventful C-section .  I reviewed her labs she has had a slightly low platelets in 2013 likely related to her gestation . Platelets were about 170 k on 09/08/2014 and dropped down to 59 k prior to her C-section on 11/18/2015 and so she needed general anesthesia and could not get an epidural or spinal .her platelets have since been in the 38 k to 83k range. She was recently in the emergency room 03/16/2016   and her platelet count was noted to be  47k and she has been referred to us for further evaluation.  Notes some easy bruisabiliy but no other overt bleeding issues. Notes chronic fatigue and issues with depression and was recommended to seek professional consultation with behavioral health provider.   MEDICAL HISTORY:  Past Medical History:  Diagnosis Date  . Alcohol abuse   . Maternal alloimmune thrombocytopenia complicating pregnancy   . Narcotic abuse   . Thrombocytopenia (HCC)   Polysubstance abuse\h/on concussion while plyaing soft ball in her teens.  SURGICAL HISTORY: Past Surgical History:  Procedure Laterality Date  . BILATERAL SALPINGECTOMY Bilateral 11/18/2014   Procedure: BILATERAL SALPINGECTOMY;  Surgeon: Lazaro ArmsLuther H Eure, MD;  Location: [facility];  Service: Obstetrics;  Laterality: Bilateral;  . CESAREAN SECTION    . CESAREAN SECTION  11/26/2011   Procedure: CESAREAN SECTION;  Surgeon: Lazaro ArmsLuther H Eure, MD;  Location: [facility];  Service: Gynecology;  Laterality: N/A;  . CESAREAN SECTION N/A 11/18/2014   Procedure: CESAREAN SECTION;  Surgeon: Lazaro ArmsLuther H Eure, MD;  Location: [facility];  Service: Obstetrics;  Laterality: N/A;    SOCIAL HISTORY: Social History   Social History  . Marital status: Married    Spouse name: N/A  . Number of children: N/A  . Years of education: N/A   Occupational History  . Not on file.   Social History Main Topics  . Smoking status: Current Every Day Smoker    Packs/day: 0.50    Years: 5.00    Types: Cigarettes  . Smokeless tobacco: Never  Used  . Alcohol use Yes     Comment: pint today  . Drug use: No  . Sexual activity: Yes    Birth control/ protection: None   Other Topics Concern  . Not on file   Social History Narrative  . No narrative on file   Cigarette smoker 1PPD x 10 yrs ETOH - binging on and off Drug use on and off for 10 yrs including Active Marijuana use. Previously has used heroine, "pain pills", IVDA, suboxones and opana  FAMILY HISTORY: Family History  Problem  Relation Age of Onset  . Cancer Mother 31    breast   . Hypertension Father   . Diabetes Father   . Other Brother     Alpha 1  . Cancer Maternal Grandfather     prostate  . Hypertension Maternal Grandfather   . Early death Paternal Grandfather   . Alcohol abuse Paternal Grandfather   Brother with Alpha 1 AT deficiency - different father Mom- breast cancer in her 106's ER+ DCIS  Breast cancer in 2 maternal cousins M.Uncle - leukemia MGA- stomach cancer M.Aunt- Colon cancer  ALLERGIES:  has No Known Allergies.  MEDICATIONS:  No current outpatient prescriptions on file.   No current facility-administered medications for this visit.     REVIEW OF SYSTEMS:    10 Point review of Systems was done is negative except as noted above.  PHYSICAL EXAMINATION: ECOG PERFORMANCE STATUS: 1 - Symptomatic but completely ambulatory  . Vitals:   03/27/16 0918  Pulse: 88  Resp: 18  Temp: 98.3 F (36.8 C)   Filed Weights   03/27/16 0918  Weight: 149 lb 2 oz (67.6 kg)   .Body mass index is 22.02 kg/m.  GENERAL:alert, in no acute distress and comfortable SKIN: skin color, texture, turgor are normal, no rashes or significant lesions EYES: normal, conjunctiva are pink and non-injected, sclera clear OROPHARYNX:no exudate, no erythema and lips, buccal mucosa, and tongue normal  NECK: supple, no JVD, thyroid normal size, non-tender, without nodularity LYMPH:  no palpable lymphadenopathy in the cervical, axillary or inguinal LUNGS: clear to auscultation with normal respiratory effort HEART: regular rate & rhythm,  no murmurs and no lower extremity edema ABDOMEN: abdomen soft, non-tender, normoactive bowel sounds , no palpable hepatosplenomegaly. Musculoskeletal: no cyanosis of digits and no clubbing  PSYCH: alert & oriented x 3 with fluent speech NEURO: no focal motor/sensory deficits  LABORATORY DATA:  I have reviewed the data as listed  . CBC Latest Ref Rng & Units 03/27/2016  03/16/2016 11/28/2014  WBC 4.0 - 10.5 K/uL 8.6 5.8 9.2  Hemoglobin 12.0 - 15.0 g/dL 95.6 21.3 -  Hematocrit 36.0 - 46.0 % 41.8 40.9 33.4(L)  Platelets 150 - 400 K/uL 53(L) 47(L) 83(LL)    . CMP Latest Ref Rng & Units 03/27/2016 03/16/2016 11/19/2014  Glucose 65 - 99 mg/dL 94 086(V) 784(O)  BUN 6 - 20 mg/dL 7 5(L) 8  Creatinine 9.62 - 1.00 mg/dL 9.52 8.41 3.24  Sodium 135 - 145 mmol/L 138 141 135  Potassium 3.5 - 5.1 mmol/L 4.1 3.3(L) 3.8  Chloride 101 - 111 mmol/L 104 107 103  CO2 22 - 32 mmol/L 29 26 28   Calcium 8.9 - 10.3 mg/dL 9.3 8.9 4.0(N)  Total Protein 6.5 - 8.1 g/dL 7.4 7.4 5.9(L)  Total Bilirubin 0.3 - 1.2 mg/dL 0.5 0.6 0.6  Alkaline Phos 38 - 126 U/L 82 86 148(H)  AST 15 - 41 U/L 15 18 34  ALT 14 -  54 U/L 13(L) 15 13(L)     Component     Latest Ref Rng & Units 03/27/2016  Retic Ct Pct     0.4 - 3.1 % 1.2  RBC.     3.87 - 5.11 MIL/uL 4.66  Retic Count, Manual     19.0 - 186.0 K/uL 55.9  TSH     0.350 - 4.500 uIU/mL 0.785  HIV     Non Reactive Non Reactive  HCV Ab     0.0 - 0.9 s/co ratio <0.1  LDH     98 - 192 U/L 130  Sed Rate     0 - 22 mm/hr 10  Vitamin B12     180 - 914 pg/mL 457   RADIOGRAPHIC STUDIES: I have personally reviewed the radiological images as listed and agreed with the findings in the report. Ct Head Wo Contrast  Result Date: 03/16/2016 CLINICAL DATA:  Altered level consciousness, fall EXAM: CT HEAD WITHOUT CONTRAST TECHNIQUE: Contiguous axial images were obtained from the base of the skull through the vertex without intravenous contrast. COMPARISON:  CT head dated 08/18/2008 FINDINGS: Brain: No evidence of acute infarction, hemorrhage, hydrocephalus, extra-axial collection or mass lesion/mass effect. Vascular: No hyperdense vessel or unexpected calcification. Skull: Normal. Negative for fracture or focal lesion. Sinuses/Orbits: The visualized paranasal sinuses are essentially clear. The mastoid air cells are unopacified. Other: Cerebral volume is  within normal limits. No ventriculomegaly. IMPRESSION: Normal head CT. Electronically Signed   By: Charline Bills M.D.   On: 03/16/2016 20:15   US Abdomen Complete  Result Date: 04/05/2016 CLINICAL DATA:  Thrombocytopenia.  Abdominal pain. EXAM: ABDOMEN ULTRASOUND COMPLETE COMPARISON:  None. FINDINGS: Gallbladder: No gallstones or wall thickening visualized. No sonographic Murphy sign noted by sonographer. Common bile duct: Diameter: 1 mm Liver: No focal lesion identified. Within normal limits in parenchymal echogenicity. IVC: No abnormality visualized. Pancreas: Visualized portion unremarkable. Spleen: Size and appearance within normal limits. Right Kidney: Length: 11.7 cm. Echogenicity within normal limits. No mass or hydronephrosis visualized. Left Kidney: Length: 11.3 cm. Echogenicity within normal limits. No mass or hydronephrosis visualized. Abdominal aorta: No aneurysm visualized. Other findings: None. IMPRESSION: Normal abdominal ultrasound. Electronically Signed   By: Elige Ko   On: 04/05/2016 09:43    ASSESSMENT & PLAN:   25 yo caucasian female with   1) Isolated Thrombocytopenia (Chronic as noted above) Smear with no evidence of platelet clumping Patient has h/o Polysubstance abuse including ETOH, marijuana and heroine which could certainly contribute to this. Heroine itself and with potential additively can cause thrombocytopenia. Cannot r/o ITP HIV and hep C - done -neg B12 WNL Normal LDH suggest against concurrent hemolysis. Also hgb wnl. [place] Abd - does not show any splenomegaly or liver pathology. Not currently on any prescription medications. No LNadenopathy to suggest lymphoproliferative disease  PLAN -no overt bleeding at this time to recommend PLT transfusion or other treatment. -counseled to absolutely avoid ETOH and other drug -no OTC NSAIDS , ASA or other blood thinners. -if bleeding or PLT< 30k would need to consider treating for ITP.  Would consider  NPlate.  2) Cigarette smoking, polysubstance abuse 3) Substance associated mood disorder PLAN -counseled on smoking cessation and to get help through drug rehab and to see a psychiatrist to manage her substance associated mood disorder.  RTC with Dr Galen Manila with labs in 4 weeks.  All of the patients questions were answered with apparent satisfaction. The patient knows to call the clinic with any problems, questions  or concerns.  I spent 50 minutes counseling the patient face to face. The total time spent in the appointment was 60 minutes and more than 50% was on counseling and direct patient cares.    Wyvonnia Lora MD MS AAHIVMS [facility] Hematology/Oncology Physician [facility]  (Office):       [phone] (Work cell):  [phone] (Fax):           [phone]  03/27/2016 9:16 AM

## 2016-03-27 NOTE — Patient Instructions (Addendum)
[facility]  Discharge Instructions:  Seen MD Candise CheKale today.  Labs today.   Ultrasound of abdomen.   Follow up with MD Penland within a month.   No NSAIDs  Refrain from drug/alcohol use.     _______________________________________________________________  Thank you for choosing [facility] at [facility] to provide your oncology and hematology care.  To afford each patient quality time with our providers, please arrive at least 15 minutes before your scheduled appointment.  You need to re-schedule your appointment if you arrive 10 or more minutes late.  We strive to give you quality time with our providers, and arriving late affects you and other patients whose appointments are after yours.  Also, if you no show three or more times for appointments you may be dismissed from the clinic.  Again, thank you for choosing [facility]. Our hope is that these requests will allow you access to exceptional care and in a timely manner. _______________________________________________________________  If you have questions after your visit, please contact our office at [phone] between the hours of 8:30 a.m. and 5:00 p.m. Voicemails left after 4:30 p.m. will not be returned until the following business day. _______________________________________________________________  For prescription refill requests, have your pharmacy contact our office. _______________________________________________________________  Recommendations made by the consultant and any test results will be sent to your referring physician. _______________________________________________________________

## 2016-03-28 LAB — HEPATITIS C ANTIBODY

## 2016-03-28 LAB — HIV ANTIBODY (ROUTINE TESTING W REFLEX): HIV Screen 4th Generation wRfx: NONREACTIVE

## 2016-03-28 LAB — PATHOLOGIST SMEAR REVIEW

## 2016-04-05 ENCOUNTER — Ambulatory Visit (HOSPITAL_COMMUNITY)
Admission: RE | Admit: 2016-04-05 | Discharge: 2016-04-05 | Disposition: A | Discharge status: Home / Self Care | Payer: Medicaid Other | Source: Ambulatory Visit | Attending: Hematology | Admitting: Hematology

## 2016-04-05 DIAGNOSIS — D696 Thrombocytopenia, unspecified: Secondary | ICD-10-CM | POA: Diagnosis not present

## 2016-04-15 ENCOUNTER — Telehealth: Payer: Self-pay | Admitting: *Deleted

## 2016-04-15 NOTE — Telephone Encounter (Signed)
Attempted to call patient per staff message. No answer and could not leave voicemail.

## 2016-04-15 NOTE — Telephone Encounter (Signed)
Called patient per staff message and informed patient of information stated below. Patient verbalized understanding and know that any further issues arise, she should contact MD Penland.

## 2016-04-15 NOTE — Telephone Encounter (Signed)
-----   Message from Johney MaineGautam Kishore Kale, MD sent at 04/13/2016  9:04 AM EDT ----- Regarding: RE: Labs/referral [facility] [place], Her platelet counts are stable at this time..no intervention needed currently.  I do not specifically have knowledge of the local addiction medicine resources in the community. I could find Sherre LainMargaret Bowen, MD [address] [place], [place] [address]   Phone number [phone]  Could discuss with Dr Penland/Tom regarding if they have a particular preference.  Thanks, GK    ----- Message ----- From: Sonnie AlamoLashonya A Nalleli Largent, RN Sent: 04/11/2016   4:00 PM To: Johney MaineGautam Kishore Kale, MD Subject: Labs/referral                                  MD Candise CheKale,    [facility]. Do you recommend anything by looking at her labs? If so, I will contact her and let her know. Also this patient stated that she would like help (referring to her addiction). She said it was talked about at the office visit. She said she would like to get the referral and started before her office visit.    Thanks LC

## 2016-04-25 ENCOUNTER — Encounter (HOSPITAL_COMMUNITY): Payer: Medicaid Other | Admitting: Hematology & Oncology

## 2016-06-04 ENCOUNTER — Ambulatory Visit (HOSPITAL_COMMUNITY): Payer: Medicaid Other | Admitting: Hematology & Oncology

## 2016-06-17 NOTE — Progress Notes (Signed)
This encounter was created in error - please disregard.

## 2017-09-23 IMAGING — CT CT HEAD W/O CM
3 series · 14 of 46 positions shown, 16 images · non-contrast
Comparison: CT head dated 08/18/2008

CLINICAL DATA: Altered level consciousness, fall

EXAM:
CT HEAD WITHOUT CONTRAST
TECHNIQUE: Contiguous axial images were obtained from the base of the skull
through the vertex without intravenous contrast.

[Series 2: head wo · axial · 0.43mm/px · z∈[+1511,+1631]mm · 8 of 29 slices shown, 10 images]
[im 3/29  brain]
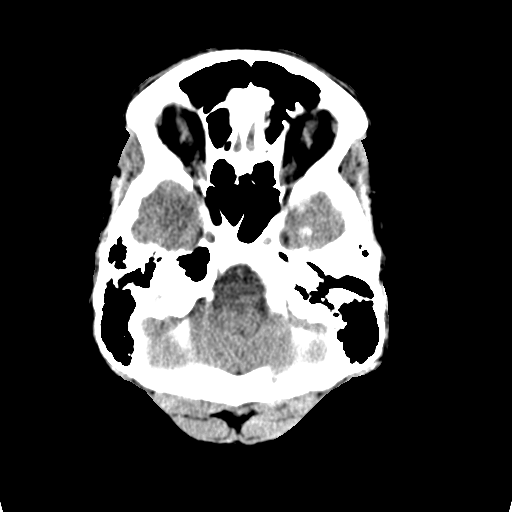
[im 3/29  bone]
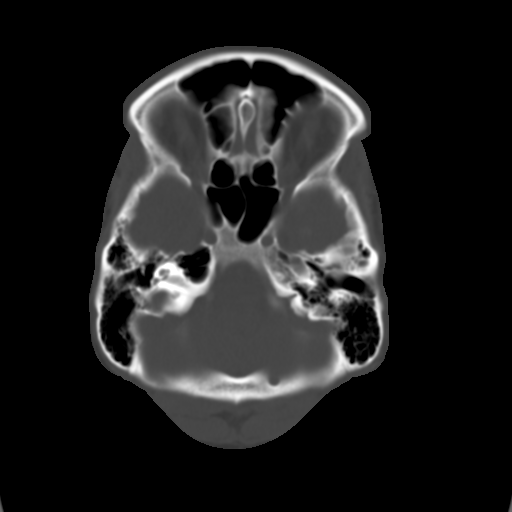
[im 7/29  brain]
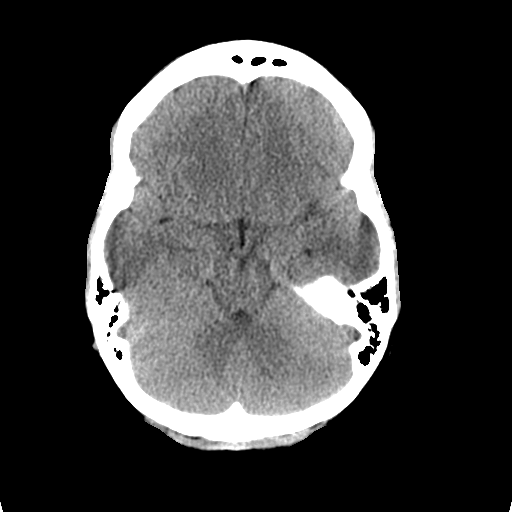
[im 10/29  brain]
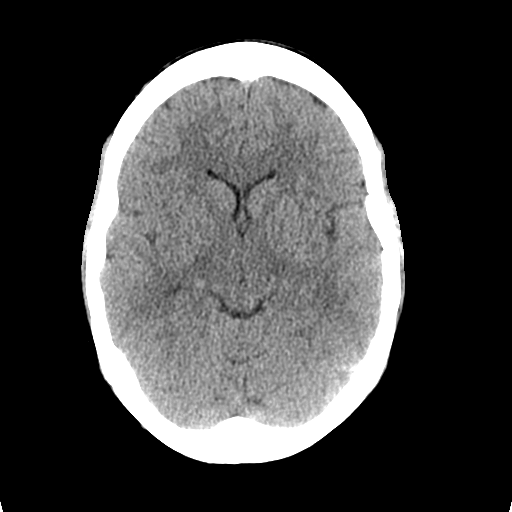
[im 13/29  brain]
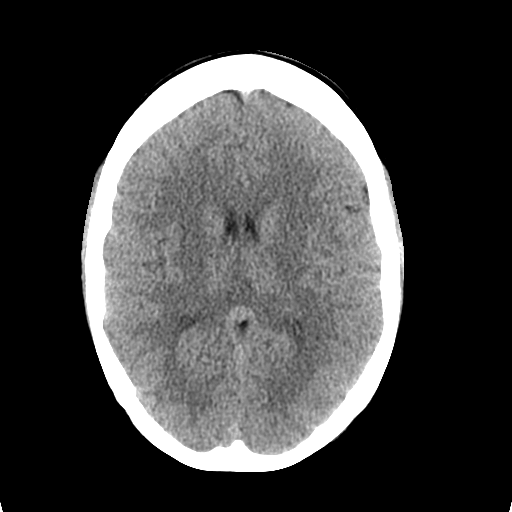
[im 17/29  brain]
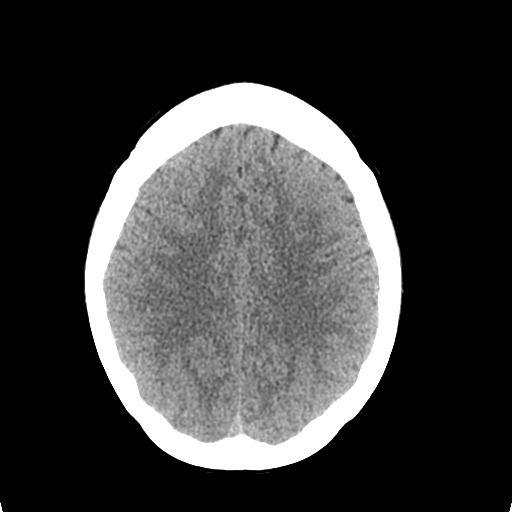
[im 17/29  bone]
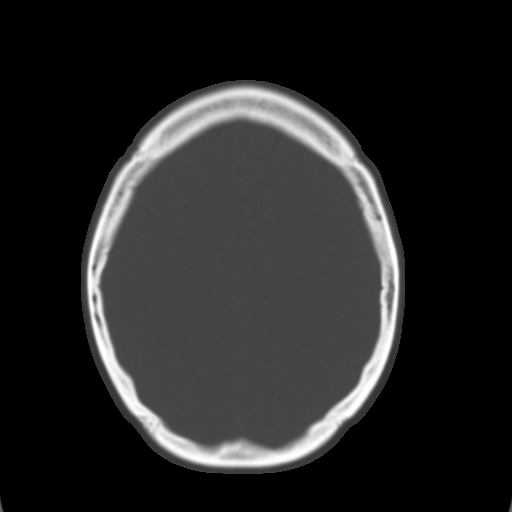
[im 20/29  brain]
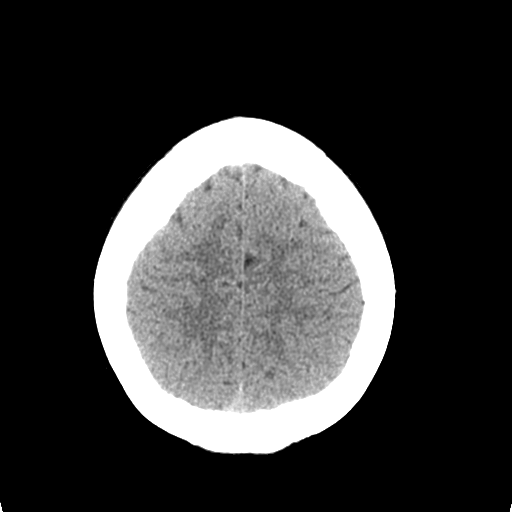
[im 23/29  brain]
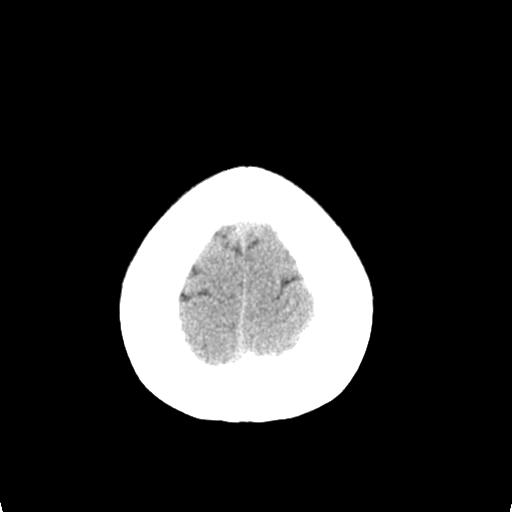
[im 27/29  brain]
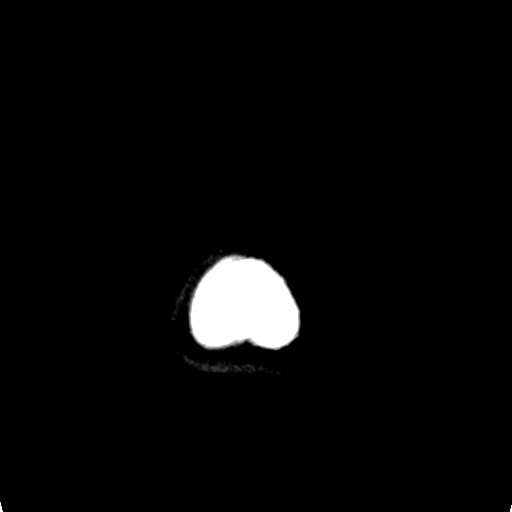

[Series 4: coronal soft tissue · coronal · 0.28mm/px · 3 of 68 slices shown]
[im 23/68  brain]
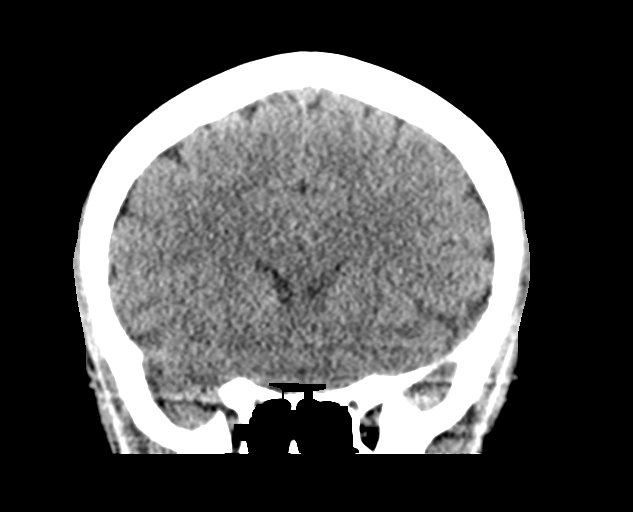
[im 30/68  brain]
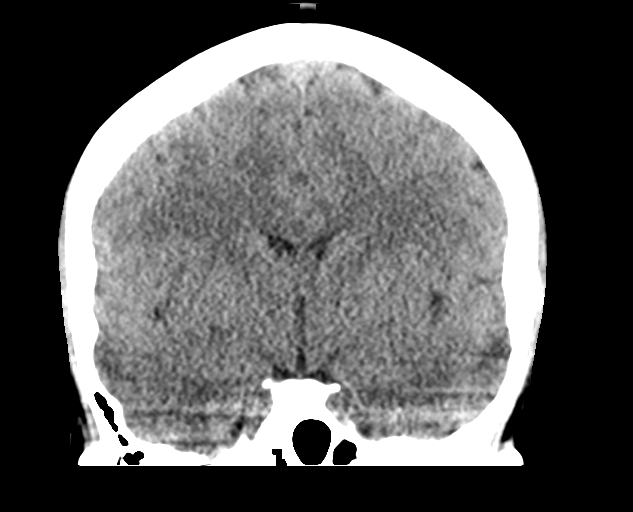
[im 38/68  brain]
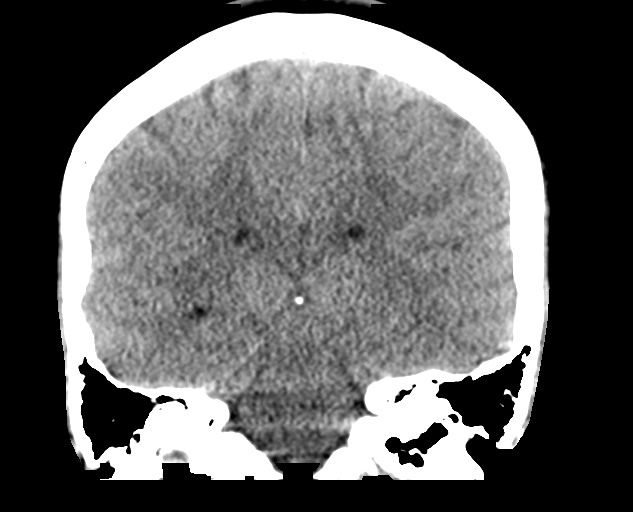

[Series 5: sagittal soft tissue · sagittal · 0.29mm/px · 3 of 51 slices shown]
[im 17/51  brain]
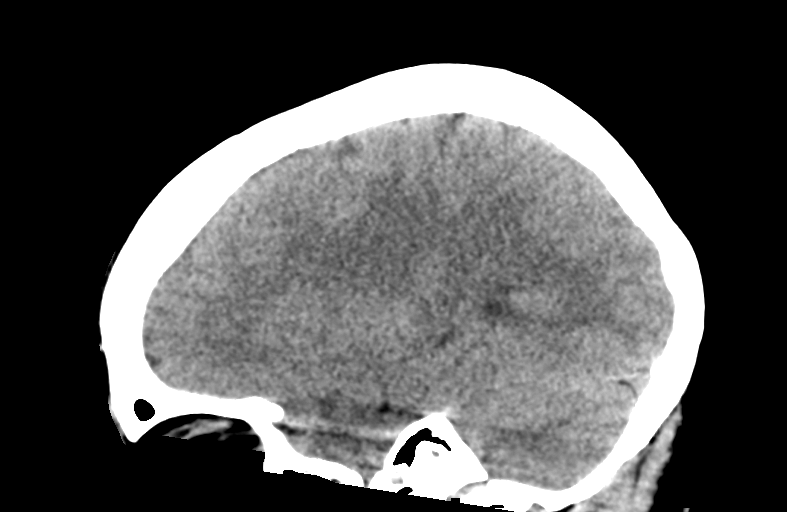
[im 26/51  brain]
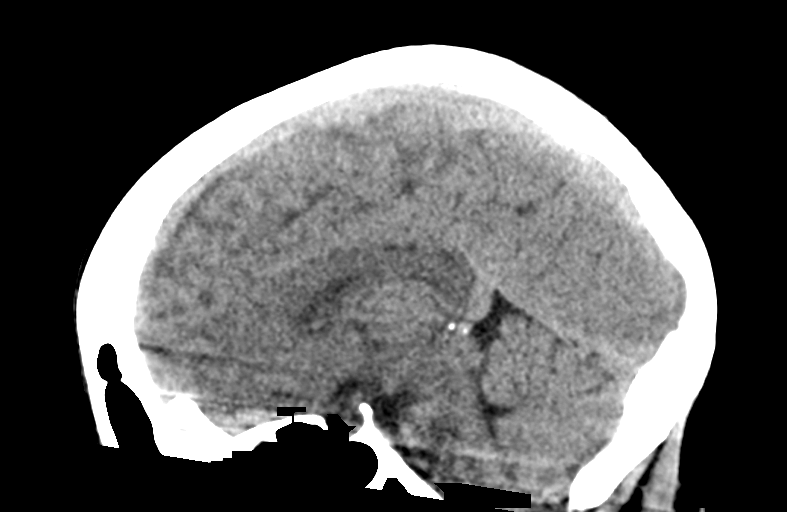
[im 34/51  brain]
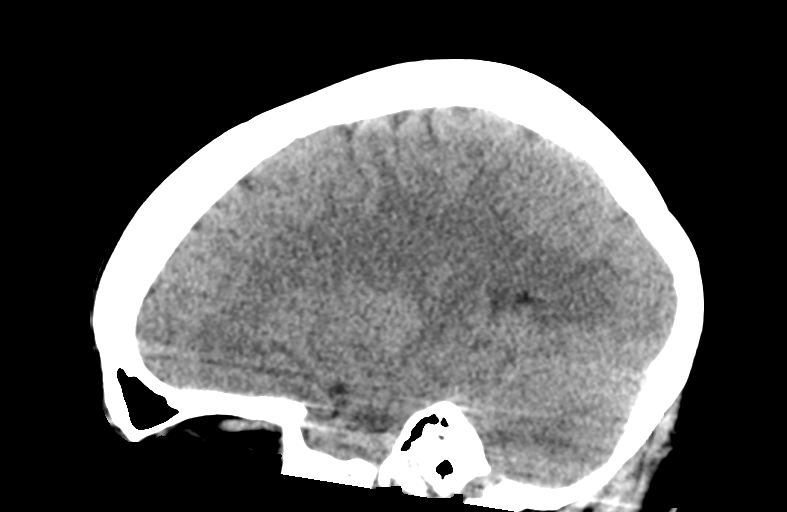

[14 of 46 positions shown; findings below may reference images not displayed]

FINDINGS: Brain: No evidence of acute infarction, hemorrhage, hydrocephalus,
extra-axial collection or mass lesion/mass effect.

Vascular: No hyperdense vessel or unexpected calcification.

Skull: Normal. Negative for fracture or focal lesion.

Sinuses/Orbits: The visualized paranasal sinuses are essentially
clear. The mastoid air cells are unopacified.

Other: Cerebral volume is within normal limits. No ventriculomegaly.
IMPRESSION: Normal head CT.

## 2019-02-15 DIAGNOSIS — H52223 Regular astigmatism, bilateral: Secondary | ICD-10-CM | POA: Diagnosis not present

## 2019-02-15 DIAGNOSIS — H5213 Myopia, bilateral: Secondary | ICD-10-CM | POA: Diagnosis not present

## 2019-03-03 DIAGNOSIS — H52223 Regular astigmatism, bilateral: Secondary | ICD-10-CM | POA: Diagnosis not present

## 2019-03-03 DIAGNOSIS — H5213 Myopia, bilateral: Secondary | ICD-10-CM | POA: Diagnosis not present

## 2019-07-01 ENCOUNTER — Telehealth: Payer: Self-pay | Admitting: Adult Health

## 2019-07-01 NOTE — Telephone Encounter (Signed)
Called patient regarding appointment scheduled in our office and advised to come alone to the visit, however, a support person, over age 30, may accompany her to appointment if assistance is needed for safety or care concerns. Otherwise, support persons should remain outside until the visit is complete.   Prescreen questions asked: 1. Any of the following symptoms of COVID such as chills, fever, cough, shortness of breath, muscle pain, diarrhea, rash, vomiting, abdominal pain, red eye, weakness, bruising, bleeding, joint pain, loss of taste or smell, a severe headache, sore throat, fatigue 2. Any exposure to anyone suspected or confirmed of having COVID-19 3. Awaiting test results for COVID-19  Also,to keep you safe, please use the provided hand sanitizer when you enter the office. We are asking everyone in the office to wear a mask to help prevent the spread of germs. If you have a mask of your own, please wear it to your appointment, if not, we are happy to provide one for you.  Thank you for understanding and your cooperation.    [facility] Staff      

## 2019-07-05 ENCOUNTER — Ambulatory Visit: Payer: Medicaid Other | Admitting: Adult Health

## 2019-07-05 ENCOUNTER — Other Ambulatory Visit: Payer: Self-pay

## 2019-07-05 ENCOUNTER — Encounter: Payer: Self-pay | Admitting: Adult Health

## 2019-07-05 VITALS — BP 109/67 | HR 90 | Ht 68.25 in | Wt 158.0 lb

## 2019-07-05 DIAGNOSIS — N760 Acute vaginitis: Secondary | ICD-10-CM

## 2019-07-05 DIAGNOSIS — N926 Irregular menstruation, unspecified: Secondary | ICD-10-CM | POA: Diagnosis not present

## 2019-07-05 DIAGNOSIS — B9689 Other specified bacterial agents as the cause of diseases classified elsewhere: Secondary | ICD-10-CM | POA: Diagnosis not present

## 2019-07-05 DIAGNOSIS — N898 Other specified noninflammatory disorders of vagina: Secondary | ICD-10-CM | POA: Diagnosis not present

## 2019-07-05 DIAGNOSIS — Z3202 Encounter for pregnancy test, result negative: Secondary | ICD-10-CM | POA: Diagnosis not present

## 2019-07-05 DIAGNOSIS — Z862 Personal history of diseases of the blood and blood-forming organs and certain disorders involving the immune mechanism: Secondary | ICD-10-CM

## 2019-07-05 LAB — POCT URINALYSIS DIPSTICK
Blood, UA: NEGATIVE
Glucose, UA: NEGATIVE
Leukocytes, UA: NEGATIVE
Nitrite, UA: NEGATIVE
Protein, UA: NEGATIVE

## 2019-07-05 LAB — POCT WET PREP (WET MOUNT)
Clue Cells Wet Prep Whiff POC: POSITIVE
Trichomonas Wet Prep HPF POC: ABSENT

## 2019-07-05 LAB — POCT URINE PREGNANCY: Preg Test, Ur: NEGATIVE

## 2019-07-05 MED ORDER — METRONIDAZOLE 500 MG PO TABS: 500.0000 mg | ORAL_TABLET | Freq: Two times a day (BID) | ORAL | 0 refills | Status: DC

## 2019-07-05 NOTE — Progress Notes (Signed)
Patient ID: Caroline Brooks, female   DOB: 1991/02/17, 29 y.o.   MRN: [id number] History of Present Illness: Caroline Brooks is a 29 year old white white female, married, G3P3, sp tubal in complaining of having periods for 2-3 days 2-3 x per months and has headaches. Feels drained when on her period, and bruises easily. She says she wants hysterectomy to stop bleeding, the women in her family have all had.  PCP is [facility].   Current Medications, Allergies, Past Medical History, Past Surgical History, Family History and Social History were reviewed in [employer] record.     Review of Systems: Has period for 2-3 days 2-3 x per month Bruises easy Feels drained with periods Has headaches and if takes ibuprofen will bleed the next day Denies dizziness or shortness of breath  Has history of thrombocytopenia     Physical Exam:BP 109/67 (BP Location: Left Arm, Patient Position: Sitting, Cuff Size: Normal)   Pulse 90   Ht 5' 8.25" (1.734 m)   Wt 158 lb (71.7 kg)   LMP 07/01/2019 (Approximate)   Breastfeeding No   BMI 23.85 kg/m UPT is negative, and urine dipstick is negative. General:  Well developed, well nourished, no acute distress Skin:  Warm and dry Neck:  Midline trachea, normal thyroid, good ROM, no lymphadenopathy Lungs; Clear to auscultation bilaterally Cardiovascular: Regular rate and rhythm Pelvic:  External genitalia is normal in appearance, no lesions.  The vagina is normal in appearance, has gray watery discharge with odor. Urethra has no lesions or masses. The cervix is bulbous and smooth.  Uterus is felt to be normal size, shape, and contour.  No adnexal masses or tenderness noted.Bladder is non tender, no masses felt. Wet prep: +WBC and clues cells, no trich seen  Extremities/musculoskeletal:  No swelling or varicosities noted, no clubbing or cyanosis Psych:  No mood changes, alert and cooperative,seems happy Fall risk is low PHQ 2 score is 0. Examination  chaperoned by Malachy Mood LPN   Impression and Plan: 1. Pregnancy examination or test, negative result   2. History of thrombocytopenia Check CBC with diff   3. Irregular periods Will check labs  Check CBC with diff,CMP,TSH Return 1/20 for pap and physical and ROS    4. Vaginal discharge  5. BV (bacterial vaginosis) Will rx flagyl Meds ordered this encounter  Medications  . metroNIDAZOLE (FLAGYL) 500 MG tablet    Sig: Take 1 tablet (500 mg total) by mouth 2 (two) times daily.    Dispense:  14 tablet    Refill:  0    Order Specific Question:   Supervising Provider    Answer:   Duane Lope H [[id number]]

## 2019-07-06 LAB — CBC WITH DIFFERENTIAL/PLATELET
Basophils Absolute: 0.1 10*3/uL (ref 0.0–0.2)
Basos: 1 %
EOS (ABSOLUTE): 0 10*3/uL (ref 0.0–0.4)
Eos: 0 %
Hematocrit: 42.1 % (ref 34.0–46.6)
Hemoglobin: 14.1 g/dL (ref 11.1–15.9)
Immature Grans (Abs): 0 10*3/uL (ref 0.0–0.1)
Immature Granulocytes: 0 %
Lymphocytes Absolute: 2.9 10*3/uL (ref 0.7–3.1)
Lymphs: 30 %
MCH: 30 pg (ref 26.6–33.0)
MCHC: 33.5 g/dL (ref 31.5–35.7)
MCV: 90 fL (ref 79–97)
Monocytes Absolute: 0.6 10*3/uL (ref 0.1–0.9)
Monocytes: 6 %
Neutrophils Absolute: 5.8 10*3/uL (ref 1.4–7.0)
Neutrophils: 63 %
Platelets: 38 10*3/uL — CL (ref 150–450)
RBC: 4.7 x10E6/uL (ref 3.77–5.28)
RDW: 12.8 % (ref 11.7–15.4)
WBC: 9.4 10*3/uL (ref 3.4–10.8)

## 2019-07-06 LAB — COMPREHENSIVE METABOLIC PANEL
ALT: 6 IU/L (ref 0–32)
AST: 14 IU/L (ref 0–40)
Albumin/Globulin Ratio: 2 (ref 1.2–2.2)
Albumin: 4.6 g/dL (ref 3.9–5.0)
Alkaline Phosphatase: 84 IU/L (ref 39–117)
BUN/Creatinine Ratio: 13 (ref 9–23)
BUN: 9 mg/dL (ref 6–20)
Bilirubin Total: 0.3 mg/dL (ref 0.0–1.2)
CO2: 24 mmol/L (ref 20–29)
Calcium: 9.3 mg/dL (ref 8.7–10.2)
Chloride: 103 mmol/L (ref 96–106)
Creatinine, Ser: 0.71 mg/dL (ref 0.57–1.00)
GFR calc Af Amer: 133 mL/min/{1.73_m2} (ref >59)
GFR calc non Af Amer: 115 mL/min/{1.73_m2} (ref >59)
Globulin, Total: 2.3 g/dL (ref 1.5–4.5)
Glucose: 92 mg/dL (ref 65–99)
Potassium: 4.5 mmol/L (ref 3.5–5.2)
Sodium: 140 mmol/L (ref 134–144)
Total Protein: 6.9 g/dL (ref 6.0–8.5)

## 2019-07-06 LAB — TSH: TSH: 2.61 u[IU]/mL (ref 0.450–4.500)

## 2019-07-07 ENCOUNTER — Encounter: Payer: Self-pay | Admitting: Adult Health

## 2019-07-07 ENCOUNTER — Telehealth: Payer: Self-pay | Admitting: Adult Health

## 2019-07-07 DIAGNOSIS — D696 Thrombocytopenia, unspecified: Secondary | ICD-10-CM

## 2019-07-07 HISTORY — DX: Thrombocytopenia, unspecified: D69.6

## 2019-07-07 NOTE — Telephone Encounter (Signed)
Pt aware of labs and that platelets 38 will check platelet profile, discussed with Dr Despina Hidden

## 2019-07-09 LAB — PLATELET ANTIBODY PROFILE
Glycoprotein IV Antibody: NEGATIVE
HLA Ab Ser Ql EIA: NEGATIVE
IA/IIA Antibody: NEGATIVE
IB/IX Antibody: NEGATIVE
IIB/IIIA Antibody: NEGATIVE

## 2019-07-12 ENCOUNTER — Telehealth: Payer: Self-pay | Admitting: Adult Health

## 2019-07-12 DIAGNOSIS — D696 Thrombocytopenia, unspecified: Secondary | ICD-10-CM

## 2019-07-12 NOTE — Telephone Encounter (Signed)
Left message that platelet profile was negative, and that sent referral to hematology at [facility] so except a call from them, if not let me know. See ya on 1/20 for pap and physical

## 2019-07-13 ENCOUNTER — Telehealth: Payer: Self-pay | Admitting: Adult Health

## 2019-07-13 NOTE — Telephone Encounter (Signed)
Called patient regarding appointment scheduled in our office and advised to come alone to the visit, however, a support person, over age 30, may accompany her to appointment if assistance is needed for safety or care concerns. Otherwise, support persons should remain outside until the visit is complete.   Prescreen questions asked: 1. Any of the following symptoms of COVID such as chills, fever, cough, shortness of breath, muscle pain, diarrhea, rash, vomiting, abdominal pain, red eye, weakness, bruising, bleeding, joint pain, loss of taste or smell, a severe headache, sore throat, fatigue 2. Any exposure to anyone suspected or confirmed of having COVID-19 3. Awaiting test results for COVID-19  Also,to keep you safe, please use the provided hand sanitizer when you enter the office. We are asking everyone in the office to wear a mask to help prevent the spread of germs. If you have a mask of your own, please wear it to your appointment, if not, we are happy to provide one for you.  Thank you for understanding and your cooperation.    [facility] Staff      

## 2019-07-14 ENCOUNTER — Other Ambulatory Visit (HOSPITAL_COMMUNITY)
Admission: RE | Admit: 2019-07-14 | Discharge: 2019-07-14 | Disposition: A | Discharge status: Home / Self Care | Payer: Medicaid Other | Source: Ambulatory Visit | Attending: Adult Health | Admitting: Adult Health

## 2019-07-14 ENCOUNTER — Other Ambulatory Visit: Payer: Self-pay

## 2019-07-14 ENCOUNTER — Encounter: Payer: Self-pay | Admitting: Adult Health

## 2019-07-14 ENCOUNTER — Ambulatory Visit (INDEPENDENT_AMBULATORY_CARE_PROVIDER_SITE_OTHER): Payer: Medicaid Other | Admitting: Adult Health

## 2019-07-14 VITALS — BP 106/67 | HR 73 | Ht 68.25 in | Wt 154.0 lb

## 2019-07-14 DIAGNOSIS — Z01419 Encounter for gynecological examination (general) (routine) without abnormal findings: Secondary | ICD-10-CM | POA: Insufficient documentation

## 2019-07-14 DIAGNOSIS — Z0000 Encounter for general adult medical examination without abnormal findings: Secondary | ICD-10-CM

## 2019-07-14 DIAGNOSIS — D696 Thrombocytopenia, unspecified: Secondary | ICD-10-CM

## 2019-07-14 NOTE — Progress Notes (Signed)
Patient ID: Caroline Brooks, female   DOB: Nov 06, 1990, 29 y.o.   MRN: [id number] History of Present Illness: Caroline Brooks is a 29 year old white female, married, G3P3 in for a well woman gyn exam and pap.She was seen 07/05/19 for periods 2-3 days for 2-3 x per month and headaches and feels drained with bleeding bruising.No bleeding since taking Flagyl. She had platelets of 38 and negative platelet antibody profile.She has appt with hematology 07/19/19. PCP is [facility]   Current Medications, Allergies, Past Medical History, Past Surgical History, Family History and Social History were reviewed in [employer] record.     Review of Systems: Patient denies any headaches, hearing loss, fatigue, blurred vision, shortness of breath, chest pain, abdominal pain, problems with bowel movements, urination, or intercourse. No joint pain or mood swings. No spotting since taking flagyl    Physical Exam:BP 106/67 (BP Location: Right Arm, Patient Position: Sitting, Cuff Size: Normal)   Pulse 73   Ht 5' 8.25" (1.734 m)   Wt 154 lb (69.9 kg)   LMP 07/01/2019 (Approximate)   BMI 23.24 kg/m  General:  Well developed, well nourished, no acute distress Skin:  Warm and dry Neck:  Midline trachea, normal thyroid, good ROM, no lymphadenopathy Lungs; Clear to auscultation bilaterally Breast:  No dominant palpable mass, retraction, or nipple discharge Cardiovascular: Regular rate and rhythm Abdomen:  Soft, non tender, no hepatosplenomegaly Pelvic:  External genitalia is normal in appearance, no lesions.  The vagina is normal in appearance. Urethra has no lesions or masses. The cervix is bulbous.Pap with high risk HPV 16/18 genotyping.  Uterus is felt to be normal size, shape, and contour.  No adnexal masses or tenderness noted.Bladder is non tender, no masses felt. Extremities/musculoskeletal:  No swelling or varicosities noted, no clubbing or cyanosis Psych:  No mood changes, alert and  cooperative,seems happy Fall risk is low PHQ 2 score is 0. Examination chaperoned by Marchelle Folks Rash LPN.  Impression and Plan:  1. Encounter for gynecological examination with Papanicolaou smear of cervix Pap is sent Physical in 1 year Pap in 3 if normal Mammogram at 39 Review handout on endometrial ablation   2. Thrombocytopenia (HCC) Keep appt 1/25 with hematology

## 2019-07-19 ENCOUNTER — Inpatient Hospital Stay (HOSPITAL_COMMUNITY): Payer: Medicaid Other | Attending: Hematology | Admitting: Hematology

## 2019-07-19 ENCOUNTER — Inpatient Hospital Stay (HOSPITAL_COMMUNITY): Payer: Medicaid Other

## 2019-07-19 ENCOUNTER — Other Ambulatory Visit: Payer: Self-pay

## 2019-07-19 ENCOUNTER — Encounter (HOSPITAL_COMMUNITY): Payer: Self-pay | Admitting: Hematology

## 2019-07-19 VITALS — BP 128/58 | HR 69 | Temp 97.5°F | Resp 18 | Ht 68.5 in | Wt 154.5 lb

## 2019-07-19 DIAGNOSIS — Z8042 Family history of malignant neoplasm of prostate: Secondary | ICD-10-CM | POA: Diagnosis not present

## 2019-07-19 DIAGNOSIS — F17210 Nicotine dependence, cigarettes, uncomplicated: Secondary | ICD-10-CM | POA: Diagnosis not present

## 2019-07-19 DIAGNOSIS — Z803 Family history of malignant neoplasm of breast: Secondary | ICD-10-CM | POA: Insufficient documentation

## 2019-07-19 DIAGNOSIS — Z833 Family history of diabetes mellitus: Secondary | ICD-10-CM

## 2019-07-19 DIAGNOSIS — Z8249 Family history of ischemic heart disease and other diseases of the circulatory system: Secondary | ICD-10-CM | POA: Diagnosis not present

## 2019-07-19 DIAGNOSIS — D696 Thrombocytopenia, unspecified: Secondary | ICD-10-CM

## 2019-07-19 LAB — IRON AND TIBC
Iron: 67 ug/dL (ref 28–170)
Saturation Ratios: 17 % (ref 10.4–31.8)
TIBC: 402 ug/dL (ref 250–450)
UIBC: 335 ug/dL

## 2019-07-19 LAB — CBC WITH DIFFERENTIAL/PLATELET
Abs Immature Granulocytes: 0.01 10*3/uL (ref 0.00–0.07)
Basophils Absolute: 0.1 10*3/uL (ref 0.0–0.1)
Basophils Relative: 1 %
Eosinophils Absolute: 0.1 10*3/uL (ref 0.0–0.5)
Eosinophils Relative: 1 %
HCT: 42.2 % (ref 36.0–46.0)
Hemoglobin: 13.8 g/dL (ref 12.0–15.0)
Immature Granulocytes: 0 %
Lymphocytes Relative: 36 %
Lymphs Abs: 2.7 10*3/uL (ref 0.7–4.0)
MCH: 30 pg (ref 26.0–34.0)
MCHC: 32.7 g/dL (ref 30.0–36.0)
MCV: 91.7 fL (ref 80.0–100.0)
Monocytes Absolute: 0.5 10*3/uL (ref 0.1–1.0)
Monocytes Relative: 7 %
Neutro Abs: 4.1 10*3/uL (ref 1.7–7.7)
Neutrophils Relative %: 55 %
Platelets: 43 10*3/uL — ABNORMAL LOW (ref 150–400)
RBC: 4.6 MIL/uL (ref 3.87–5.11)
RDW: 13.1 % (ref 11.5–15.5)
WBC: 7.5 10*3/uL (ref 4.0–10.5)
nRBC: 0 % (ref 0.0–0.2)

## 2019-07-19 LAB — HEPATITIS B CORE ANTIBODY, TOTAL: Hep B Core Total Ab: NONREACTIVE

## 2019-07-19 LAB — FERRITIN: Ferritin: 14 ng/mL (ref 11–307)

## 2019-07-19 LAB — CYTOLOGY - PAP
Adequacy: ABSENT
Comment: NEGATIVE
Diagnosis: NEGATIVE
High risk HPV: NEGATIVE

## 2019-07-19 LAB — LACTATE DEHYDROGENASE: LDH: 122 U/L (ref 98–192)

## 2019-07-19 LAB — SEDIMENTATION RATE: Sed Rate: 7 mm/hr (ref 0–22)

## 2019-07-19 LAB — HEPATITIS B SURFACE ANTIGEN: Hepatitis B Surface Ag: NONREACTIVE

## 2019-07-19 LAB — VITAMIN B12: Vitamin B-12: 512 pg/mL (ref 180–914)

## 2019-07-19 LAB — FOLATE: Folate: 8.5 ng/mL (ref >5.9)

## 2019-07-19 NOTE — Progress Notes (Signed)
CONSULT NOTE  Patient Care Team: Inc, [facility] as PCP - General  CHIEF COMPLAINTS/PURPOSE OF CONSULTATION:  Thrombocytopenia.  HISTORY OF PRESENTING ILLNESS:  Caroline Brooks 29 y.o. female is seen for further work-up and management of moderate to severe thrombocytopenia.  Recent CBC on 07/05/2019 showed platelet count of 38 with normal hemoglobin and white cells.  Review of her labs system shows her platelet count being low for many years.  She also reported platelet count being low when she was pregnant.  She had 3 C-sections.  She has easy bruising on the thighs for the past 10 years but not any worse.  She denies any nosebleeds but had them as a child.  She also reported heavy menstruation for many years, with bleeding 2 and half days every 2 weeks. 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  She reports that she has been clean for 3 years.  She is a current active smoker.  She stays at home and raises her children.  Family history of present with mother having breast cancer and maternal grandfather having prostate cancer.  Maternal grandmother had anemia requiring transfusions.  She denies any fevers, night sweats or weight loss in the last 6 months.  She reports appetite of 100%.  Energy levels are 75%.  No new onset pains reported.  She was evaluated by GYN and was sent to [place] for management of low platelet count.  MEDICAL HISTORY:  Past Medical History:  Diagnosis Date  . Alcohol abuse   . Maternal alloimmune thrombocytopenia complicating pregnancy   . Narcotic abuse ([place])   . Thrombocytopenia ([place])   . Thrombocytopenia ([place]) 07/07/2019   07/06/19 platelets 38 ck platelet profile    SURGICAL HISTORY: Past Surgical History:  Procedure Laterality Date  . BILATERAL SALPINGECTOMY Bilateral 11/18/2014   Procedure: BILATERAL SALPINGECTOMY;  Surgeon: Florian Buff, MD;  Location: [facility];  Service: Obstetrics;  Laterality:  Bilateral;  . CESAREAN SECTION  2011  . CESAREAN SECTION  11/26/2011   Procedure: CESAREAN SECTION;  Surgeon: Florian Buff, MD;  Location: [facility];  Service: Gynecology;  Laterality: N/A;  . CESAREAN SECTION N/A 11/18/2014   Procedure: CESAREAN SECTION;  Surgeon: Florian Buff, MD;  Location: [facility];  Service: Obstetrics;  Laterality: N/A;    SOCIAL HISTORY: Social History   Socioeconomic History  . Marital status: Married    Spouse name: Not on file  . Number of children: 3  . Years of education: Not on file  . Highest education level: Not on file  Occupational History  . Occupation: umemployed  Tobacco Use  . Smoking status: Current Some Day Smoker    Packs/day: 0.00    Years: 5.00    Pack years: 0.00    Types: Cigarettes  . Smokeless tobacco: Never Used  Substance and Sexual Activity  . Alcohol use: Not Currently  . Drug use: No  . Sexual activity: Yes    Birth control/protection: None  Other Topics Concern  . Not on file  Social History Narrative  . Not on file   Social Determinants of Health   Financial Resource Strain:   . Difficulty of Paying Living Expenses: Not on file  Food Insecurity:   . Worried About Charity fundraiser in the Last Year: Not on file  . Ran Out of Food in the Last Year: Not on file  Transportation Needs:   . Lack of Transportation (Medical):  Not on file  . Lack of Transportation (Non-Medical): Not on file  Physical Activity:   . Days of Exercise per Week: Not on file  . Minutes of Exercise per Session: Not on file  Stress:   . Feeling of Stress : Not on file  Social Connections:   . Frequency of Communication with Friends and Family: Not on file  . Frequency of Social Gatherings with Friends and Family: Not on file  . Attends Religious Services: Not on file  . Active Member of Clubs or Organizations: Not on file  . Attends Archivist Meetings: Not on file  . Marital Status: Not on file  Intimate Partner Violence:   . Fear  of Current or Ex-Partner: Not on file  . Emotionally Abused: Not on file  . Physically Abused: Not on file  . Sexually Abused: Not on file    FAMILY HISTORY: Family History  Problem Relation Age of Onset  . Cancer Mother 65       breast   . Hypertension Father   . Diabetes Father   . Other Brother        Alpha 1  . Cancer Maternal Grandfather        prostate  . Hypertension Maternal Grandfather   . Early death Paternal Grandfather   . Alcohol abuse Paternal Grandfather   . Healthy Daughter   . Anemia Maternal Grandmother   . Other Daughter        pulmonary stenosis  . Healthy Son     ALLERGIES:  has No Known Allergies.  MEDICATIONS:  No current outpatient medications on file.   No current facility-administered medications for this visit.    REVIEW OF SYSTEMS:   Constitutional: Denies fevers, chills or abnormal night sweats Eyes: Denies blurriness of vision, double vision or watery eyes Ears, nose, mouth, throat, and face: Denies mucositis or sore throat Respiratory: Denies cough, dyspnea or wheezes Cardiovascular: Denies palpitation, chest discomfort or lower extremity swelling Gastrointestinal:  Denies nausea, heartburn or change in bowel habits Skin: Denies abnormal skin rashes Lymphatics: Denies new lymphadenopathy or easy bruising Neurological:Denies numbness, tingling or new weaknesses Behavioral/Psych: Mood is stable, no new changes  All other systems were reviewed with the patient and are negative.  PHYSICAL EXAMINATION: ECOG PERFORMANCE STATUS: 0 - Asymptomatic  Vitals:   07/19/19 1416  BP: (!) 128/58  Pulse: 69  Resp: 18  Temp: (!) 97.5 F (36.4 C)  SpO2: 100%   Filed Weights   07/19/19 1416  Weight: 154 lb 8 oz (70.1 kg)    GENERAL:alert, no distress and comfortable SKIN: skin color, texture, turgor are normal, no rashes or significant lesions EYES: normal, conjunctiva are pink and non-injected, sclera clear OROPHARYNX:no exudate, no  erythema and lips, buccal mucosa, and tongue normal  NECK: supple, thyroid normal size, non-tender, without nodularity LYMPH:  no palpable lymphadenopathy in the cervical, axillary or inguinal LUNGS: clear to auscultation and percussion with normal breathing effort HEART: regular rate & rhythm and no murmurs and no lower extremity edema ABDOMEN:abdomen soft, non-tender and normal bowel sounds Musculoskeletal:no cyanosis of digits and no clubbing  PSYCH: alert & oriented x 3 with fluent speech NEURO: no focal motor/sensory deficits  LABORATORY DATA:  I have reviewed the data as listed Recent Results (from the past 2160 hour(s))  POCT urine pregnancy     Status: None   Collection Time: 07/05/19 11:42 AM  Result Value Ref Range   Preg Test, Ur Negative Negative  POCT  Wet Prep [facility] [place])     Status: Abnormal   Collection Time: 07/05/19 12:15 PM  Result Value Ref Range   Source Wet Prep POC vagina    WBC, Wet Prep HPF POC few    Bacteria Wet Prep HPF POC     BACTERIA WET PREP MORPHOLOGY POC     Clue Cells Wet Prep HPF POC Many (A) None   Clue Cells Wet Prep Whiff POC Positive Whiff    Yeast Wet Prep HPF POC     KOH Wet Prep POC     Trichomonas Wet Prep HPF POC Absent Absent  POCT urinalysis dipstick     Status: None   Collection Time: 07/05/19 12:18 PM  Result Value Ref Range   Color, UA yellow    Clarity, UA     Glucose, UA Negative Negative   Bilirubin, UA     Ketones, UA     Spec Grav, UA     Blood, UA neg    pH, UA     Protein, UA Negative Negative   Urobilinogen, UA     Nitrite, UA neg    Leukocytes, UA Negative Negative   Appearance     Odor    CBC w/Diff     Status: Abnormal   Collection Time: 07/05/19 12:26 PM  Result Value Ref Range   WBC 9.4 3.4 - 10.8 x10E3/uL   RBC 4.70 3.77 - 5.28 x10E6/uL   Hemoglobin 14.1 11.1 - 15.9 g/dL   Hematocrit 42.1 34.0 - 46.6 %   MCV 90 79 - 97 fL   MCH 30.0 26.6 - 33.0 pg   MCHC 33.5 31.5 - 35.7 g/dL   RDW 12.8 11.7 - 15.4  %   Platelets 38 (LL) 150 - 450 x10E3/uL    Comment: Actual platelet count may be somewhat higher than reported due to aggregation of platelets in this sample.    Neutrophils 63 Not Estab. %   Lymphs 30 Not Estab. %   Monocytes 6 Not Estab. %   Eos 0 Not Estab. %   Basos 1 Not Estab. %   Neutrophils Absolute 5.8 1.4 - 7.0 x10E3/uL   Lymphocytes Absolute 2.9 0.7 - 3.1 x10E3/uL   Monocytes Absolute 0.6 0.1 - 0.9 x10E3/uL   EOS (ABSOLUTE) 0.0 0.0 - 0.4 x10E3/uL   Basophils Absolute 0.1 0.0 - 0.2 x10E3/uL   Immature Granulocytes 0 Not Estab. %   Immature Grans (Abs) 0.0 0.0 - 0.1 x10E3/uL   Hematology Comments: Note:     Comment: Verified by microscopic examination.  Comprehensive metabolic panel     Status: None   Collection Time: 07/05/19 12:26 PM  Result Value Ref Range   Glucose 92 65 - 99 mg/dL   BUN 9 6 - 20 mg/dL   Creatinine, Ser 0.71 0.57 - 1.00 mg/dL   GFR calc non Af Amer 115 >59 mL/min/1.73   GFR calc Af Amer 133 >59 mL/min/1.73   BUN/Creatinine Ratio 13 9 - 23   Sodium 140 134 - 144 mmol/L   Potassium 4.5 3.5 - 5.2 mmol/L   Chloride 103 96 - 106 mmol/L   CO2 24 20 - 29 mmol/L   Calcium 9.3 8.7 - 10.2 mg/dL   Total Protein 6.9 6.0 - 8.5 g/dL   Albumin 4.6 3.9 - 5.0 g/dL   Globulin, Total 2.3 1.5 - 4.5 g/dL   Albumin/Globulin Ratio 2.0 1.2 - 2.2   Bilirubin Total 0.3 0.0 - 1.2 mg/dL   Alkaline Phosphatase  84 39 - 117 IU/L   AST 14 0 - 40 IU/L   ALT 6 0 - 32 IU/L  TSH     Status: None   Collection Time: 07/05/19 12:26 PM  Result Value Ref Range   TSH 2.610 0.450 - 4.500 uIU/mL  Platelet Antibody Profile     Status: None   Collection Time: 07/07/19 10:42 AM  Result Value Ref Range   HLA Ab Ser Ql EIA Negative Negative   IIB/IIIA Antibody Negative Negative   IB/IX Antibody Negative Negative   IA/IIA Antibody Negative Negative   Glycoprotein IV Antibody Negative Negative  Cytology - PAP( [place])     Status: None   Collection Time: 07/14/19 11:41 AM   Result Value Ref Range   High risk HPV Negative    Adequacy      Satisfactory for evaluation; transformation zone component ABSENT.   Diagnosis      - Negative for intraepithelial lesion or malignancy (NILM)   Comment Normal Reference Range HPV - Negative   CBC with Differential/Platelet     Status: Abnormal   Collection Time: 07/19/19  2:53 PM  Result Value Ref Range   WBC 7.5 4.0 - 10.5 K/uL   RBC 4.60 3.87 - 5.11 MIL/uL   Hemoglobin 13.8 12.0 - 15.0 g/dL   HCT 42.2 36.0 - 46.0 %   MCV 91.7 80.0 - 100.0 fL   MCH 30.0 26.0 - 34.0 pg   MCHC 32.7 30.0 - 36.0 g/dL   RDW 13.1 11.5 - 15.5 %   Platelets 43 (L) 150 - 400 K/uL    Comment: PLATELET COUNT CONFIRMED BY SMEAR SPECIMEN CHECKED FOR CLOTS Immature Platelet Fraction may be clinically indicated, consider ordering this additional test [id number]    nRBC 0.0 0.0 - 0.2 %   Neutrophils Relative % 55 %   Neutro Abs 4.1 1.7 - 7.7 K/uL   Lymphocytes Relative 36 %   Lymphs Abs 2.7 0.7 - 4.0 K/uL   Monocytes Relative 7 %   Monocytes Absolute 0.5 0.1 - 1.0 K/uL   Eosinophils Relative 1 %   Eosinophils Absolute 0.1 0.0 - 0.5 K/uL   Basophils Relative 1 %   Basophils Absolute 0.1 0.0 - 0.1 K/uL   Immature Granulocytes 0 %   Abs Immature Granulocytes 0.01 0.00 - 0.07 K/uL    Comment: Performed at [facility], [address] [place], [place] [address]  Lactate dehydrogenase     Status: None   Collection Time: 07/19/19  2:53 PM  Result Value Ref Range   LDH 122 98 - 192 U/L    Comment: Performed at [facility], [address] [place], [place] [address]  Sedimentation rate     Status: None   Collection Time: 07/19/19  2:53 PM  Result Value Ref Range   Sed Rate 7 0 - 22 mm/hr    Comment: Performed at [facility], [address], [place], [place] [address]  Iron and TIBC     Status: None   Collection Time: 07/19/19  2:55 PM  Result Value Ref Range   Iron 67 28 - 170 ug/dL   TIBC 402 250 - 450 ug/dL   Saturation Ratios 17 10.4  - 31.8 %   UIBC 335 ug/dL    Comment: Performed at [facility], [address], [place], [place] [address]  Ferritin     Status: None   Collection Time: 07/19/19  2:55 PM  Result Value Ref Range   Ferritin  14 11 - 307 ng/mL    Comment: Performed at [facility], [address] [place], [place] [address]  Vitamin B12     Status: None   Collection Time: 07/19/19  2:55 PM  Result Value Ref Range   Vitamin B-12 512 180 - 914 pg/mL    Comment: (NOTE) This assay is not validated for testing neonatal or myeloproliferative syndrome specimens for Vitamin B12 levels. Performed at [facility], [address] [place], [place] [address]   Folate     Status: None   Collection Time: 07/19/19  2:55 PM  Result Value Ref Range   Folate 8.5 >5.9 ng/mL    Comment: Performed at [facility], [address] [place], [place] [address]    RADIOGRAPHIC STUDIES: I have personally reviewed the radiological images as listed and agreed with the findings in the report.  ASSESSMENT & PLAN:  Thrombocytopenia ([place]) 1.  Moderate to severe thrombocytopenia: -She had history of thrombocytopenia dating back to 2016 when platelet count ranged between 40 and 70. -She reportedly had thrombocytopenia during her pregnancy also. -She reported heavy menses lasting 2 and half days every 2 weeks for the past few years.  No increase in the bleeding. -She apparently had nosebleeds as a child but does not have anymore. -She also reports easy bruising particularly on the upper thighs for the past 10 years and stable. -She reported that she had abused the heroin, pain pills, benzos and alcohol in the past.  She reported that she has been clean for 3 years. -An ultrasound of the abdomen on 04/05/2016 shows spleen size normal.  No other pathology seen. -Most recent CBC on 07/05/2019 showed platelet count of 38.  Hemoglobin and white cells were normal. -Differential diagnosis includes immune mediated thrombocytopenia. -Patient is  thinking about having a procedure done to minimize her menstrual bleeding.  If they do need a platelet count of more than 50 K, we will give her a trial of dexamethasone 40 mg for 4 days. -Today we will check for various nutritional deficiencies and review her smear.  We will also check LDH, hepatitis panel and SPEP. -We will arrange for her a phone visit in 2 weeks.     All questions were answered. The patient knows to call the clinic with any problems, questions or concerns.      Derek Jack, MD 07/19/19 6:16 PM

## 2019-07-19 NOTE — Assessment & Plan Note (Signed)
1.  Moderate to severe thrombocytopenia: -She had history of thrombocytopenia dating back to 2016 when platelet count ranged between 40 and 70. -She reportedly had thrombocytopenia during her pregnancy also. -She reported heavy menses lasting 2 and half days every 2 weeks for the past few years.  No increase in the bleeding. -She apparently had nosebleeds as a child but does not have anymore. -She also reports easy bruising particularly on the upper thighs for the past 10 years and stable. -She reported that she had abused the heroin, pain pills, benzos and alcohol in the past.  She reported that she has been clean for 3 years. -An ultrasound of the abdomen on 04/05/2016 shows spleen size normal.  No other pathology seen. -Most recent CBC on 07/05/2019 showed platelet count of 38.  Hemoglobin and white cells were normal. -Differential diagnosis includes immune mediated thrombocytopenia. -Patient is thinking about having a procedure done to minimize her menstrual bleeding.  If they do need a platelet count of more than 50 K, we will give her a trial of dexamethasone 40 mg for 4 days. -Today we will check for various nutritional deficiencies and review her smear.  We will also check LDH, hepatitis panel and SPEP. -We will arrange for her a phone visit in 2 weeks.

## 2019-07-19 NOTE — Patient Instructions (Addendum)
[facility] at [facility] Discharge Instructions  You were seen today by Dr. Ellin Saba. He went over your history, family history and how you've been feeling lately. He will have blood drawn today before you leave. Try to avoid taking Ibuprofen, Advil, Motrin, take Tylenol instead. 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 We will continue to monitor your labs every 3 months to be sure your numbers don't get too low.  He will follow up with you by phone with your results.  Thank you for choosing [facility] at [facility] to provide your oncology and hematology care.  To afford each patient quality time with our provider, please arrive at least 15 minutes before your scheduled appointment time.   If you have a lab appointment with the Cancer Center please come in thru the  Main Entrance and check in at the main information desk  You need to re-schedule your appointment should you arrive 10 or more minutes late.  We strive to give you quality time with our providers, and arriving late affects you and other patients whose appointments are after yours.  Also, if you no show three or more times for appointments you may be dismissed from the clinic at the providers discretion.     Again, thank you for choosing [facility].  Our hope is that these requests will decrease the amount of time that you wait before being seen by our physicians.       _____________________________________________________________  Should you have questions after your visit to [facility], please contact our office at [phone] between the hours of 8:00 a.m. and 4:30 p.m.  Voicemails left after 4:00 p.m. will not be returned until the following business day.  For prescription refill requests, have your pharmacy contact our office and allow 72  hours.    Cancer Center Support Programs:   > Cancer Support Group  2nd Tuesday of the month 1pm-2pm, Journey Room

## 2019-07-20 LAB — PROTEIN ELECTROPHORESIS, SERUM
A/G Ratio: 1.7 (ref 0.7–1.7)
Albumin ELP: 4.3 g/dL (ref 2.9–4.4)
Alpha-1-Globulin: 0.1 g/dL (ref 0.0–0.4)
Alpha-2-Globulin: 0.7 g/dL (ref 0.4–1.0)
Beta Globulin: 0.9 g/dL (ref 0.7–1.3)
Gamma Globulin: 0.9 g/dL (ref 0.4–1.8)
Globulin, Total: 2.6 g/dL (ref 2.2–3.9)
Total Protein ELP: 6.9 g/dL (ref 6.0–8.5)

## 2019-07-20 LAB — PATHOLOGIST SMEAR REVIEW

## 2019-07-20 LAB — HEPATITIS C ANTIBODY: HCV Ab: REACTIVE — AB

## 2019-07-20 LAB — HEPATITIS B SURFACE ANTIBODY,QUALITATIVE: Hep B S Ab: REACTIVE — AB

## 2019-07-22 LAB — COPPER, SERUM: Copper: 94 ug/dL (ref 80–158)

## 2019-07-23 LAB — METHYLMALONIC ACID, SERUM: Methylmalonic Acid, Quantitative: 193 nmol/L (ref 0–378)

## 2019-07-26 ENCOUNTER — Encounter (HOSPITAL_COMMUNITY): Payer: Self-pay | Admitting: Hematology

## 2019-07-26 ENCOUNTER — Inpatient Hospital Stay (HOSPITAL_COMMUNITY): Payer: Medicaid Other | Attending: Hematology | Admitting: Hematology

## 2019-07-26 ENCOUNTER — Other Ambulatory Visit: Payer: Self-pay

## 2019-07-26 DIAGNOSIS — D696 Thrombocytopenia, unspecified: Secondary | ICD-10-CM | POA: Insufficient documentation

## 2019-07-26 DIAGNOSIS — B1920 Unspecified viral hepatitis C without hepatic coma: Secondary | ICD-10-CM | POA: Diagnosis not present

## 2019-07-26 NOTE — Progress Notes (Signed)
Virtual Visit via Telephone Note  I connected with Caroline Brooks on 07/26/19 at  3:50 PM EST by telephone and verified that I am speaking with the correct person using two identifiers.   I discussed the limitations, risks, security and privacy concerns of performing an evaluation and management service by telephone and the availability of in person appointments. I also discussed with the patient that there may be a patient responsible charge related to this service. The patient expressed understanding and agreed to proceed.   History of Present Illness: She was evaluated in the clinic for moderate to severe thrombocytopenia.  She has a history of thrombocytopenia dating back to 2016 and also had thrombocytopenia during her pregnancy.   Observations/Objective: She reported having had menstrual bleeding last Friday, lasting about 1/2 days which is rated 0.  Denies any nosebleeds or easy bruising more than her baseline.  Denies any fevers, night sweats or weight loss.  Assessment and Plan:  1.  Immune mediated thrombocytopenia: -Ultrasound of the abdomen on 04/05/2016 shows normal spleen. -She had thrombocytopenia dating back to 2016 and also had thrombocytopenia during pregnancy. -She has heavy menses lasting 2 and half days every 2 weeks for the past few years. -She was instructed to follow-up with her GYN for procedure to decrease bleeding. -If the need platelet count more than 50 K, will give trial of dexamethasone 40 mg for 4 days.  2.  Hep C antibody positivity: -Work-up for thrombocytopenia showed hepatitis C antibody positive.  She has history of heroin abuse, pain pills and benzos and alcohol in the past.  She has been clean for the past 3 years. -I have recommended hepatitis C quantitative PCR. -We will discuss the results in 2 to 3 weeks.   Follow Up Instructions: 2 to 3 weeks as phone consult.   I discussed the assessment and treatment plan with the patient. The patient  was provided an opportunity to ask questions and all were answered. The patient agreed with the plan and demonstrated an understanding of the instructions.   The patient was advised to call back or seek an in-person evaluation if the symptoms worsen or if the condition fails to improve as anticipated.  I provided 11 minutes of non-face-to-face time during this encounter.   Doreatha Massed, MD

## 2019-07-29 ENCOUNTER — Other Ambulatory Visit: Payer: Self-pay

## 2019-07-29 ENCOUNTER — Inpatient Hospital Stay (HOSPITAL_COMMUNITY): Payer: Medicaid Other

## 2019-07-29 DIAGNOSIS — D696 Thrombocytopenia, unspecified: Secondary | ICD-10-CM | POA: Diagnosis not present

## 2019-07-30 LAB — HCV RNA QUANT RFLX ULTRA OR GENOTYP
HCV RNA Qnt(log copy/mL): UNDETERMINED log10 IU/mL
HepC Qn: NOT DETECTED IU/mL

## 2019-08-09 ENCOUNTER — Ambulatory Visit (HOSPITAL_COMMUNITY): Payer: Medicaid Other | Admitting: Hematology

## 2019-08-16 ENCOUNTER — Other Ambulatory Visit: Payer: Self-pay

## 2019-08-16 ENCOUNTER — Inpatient Hospital Stay (HOSPITAL_BASED_OUTPATIENT_CLINIC_OR_DEPARTMENT_OTHER): Payer: Medicaid Other | Admitting: Hematology

## 2019-08-16 ENCOUNTER — Encounter (HOSPITAL_COMMUNITY): Payer: Self-pay | Admitting: Hematology

## 2019-08-16 DIAGNOSIS — D696 Thrombocytopenia, unspecified: Secondary | ICD-10-CM

## 2019-08-16 NOTE — Progress Notes (Signed)
Virtual Visit via Telephone Note  I connected with Caroline Brooks on 08/16/19 at  4:05 PM EST by telephone and verified that I am speaking with the correct person using two identifiers.   I discussed the limitations, risks, security and privacy concerns of performing an evaluation and management service by telephone and the availability of in person appointments. I also discussed with the patient that there may be a patient responsible charge related to this service. The patient expressed understanding and agreed to proceed.   History of Present Illness: She is seen in our clinic for moderate to severe thrombocytopenia.  This was thought to be secondary to immune mediated thrombocytopenia.  Ultrasound of the abdomen in October 2017 showed spleen size was normal.  She has reported that she abused heroin, pain pills, benzos and alcohol in the past.  She has been clean for the past 3 years.   Observations/Objective: Reports energy and appetite levels of 100%.  Has chronic headaches which are stable.  Denies any fevers, night sweats or weight loss.  Denies any easy bruising or bleeding.  She has not followed up with her GYN yet.  Assessment and Plan:  1.  ITP: -Ultrasound of the abdomen on 04/05/2016 shows normal spleen. -She had thrombocytopenia dating back to 2016 and also had thrombocytopenia during pregnancy. -She has heavy menses lasting 2 and half days every 2 weeks for the past few years. -Nutritional deficiency work-up was negative.  Hepatitis C antibody was positive.  We checked a hepatitis C RNA PCR which was negative. -She was told to follow-up with her GYN for any procedure to decrease bleeding. -If she does need a platelet count of more than 50 K prior to procedure, we will give a trial of dexamethasone 40 mg x 4 days. -Otherwise I will see her back in 2 months for follow-up with repeat CBC.   Follow Up Instructions: Follow-up in 2 months with repeat CBC.   I discussed the  assessment and treatment plan with the patient. The patient was provided an opportunity to ask questions and all were answered. The patient agreed with the plan and demonstrated an understanding of the instructions.   The patient was advised to call back or seek an in-person evaluation if the symptoms worsen or if the condition fails to improve as anticipated.  I provided 8 minutes of non-face-to-face time during this encounter.   Doreatha Massed, MD

## 2019-08-19 ENCOUNTER — Telehealth: Payer: Self-pay | Admitting: Adult Health

## 2019-08-19 NOTE — Telephone Encounter (Signed)

## 2019-08-23 ENCOUNTER — Other Ambulatory Visit: Payer: Self-pay

## 2019-08-23 ENCOUNTER — Encounter: Payer: Self-pay | Admitting: Adult Health

## 2019-08-23 ENCOUNTER — Ambulatory Visit: Payer: Medicaid Other | Admitting: Adult Health

## 2019-08-23 VITALS — BP 114/72 | HR 89 | Ht 68.5 in | Wt 157.5 lb

## 2019-08-23 DIAGNOSIS — N921 Excessive and frequent menstruation with irregular cycle: Secondary | ICD-10-CM

## 2019-08-23 DIAGNOSIS — D696 Thrombocytopenia, unspecified: Secondary | ICD-10-CM | POA: Diagnosis not present

## 2019-08-23 DIAGNOSIS — Z113 Encounter for screening for infections with a predominantly sexual mode of transmission: Secondary | ICD-10-CM | POA: Insufficient documentation

## 2019-08-23 DIAGNOSIS — N926 Irregular menstruation, unspecified: Secondary | ICD-10-CM | POA: Diagnosis not present

## 2019-08-23 HISTORY — DX: Excessive and frequent menstruation with irregular cycle: N92.1

## 2019-08-23 NOTE — Progress Notes (Signed)
  Subjective:     Patient ID: Caroline Brooks, female   DOB: 1990-12-11, 29 y.o.   MRN: [id number]  HPI Caroline Brooks is a 29 year old white female,married, G3P3, in to discuss periods, having periods every 2 weeks, and heavy 2-3 days.She is ready to have something done. 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  PCP is Caroline Brooks.  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 Reviewed medications and allergies.     Objective:   Physical Exam BP 114/72 (BP Location: Left Arm, Patient Position: Sitting, Cuff Size: Normal)   Pulse 89   Ht 5' 8.5" (1.74 m)   Wt 157 lb 8 oz (71.4 kg)   LMP 08/20/2019   BMI 23.60 kg/m  Skin warm and dry. Lungs: clear to ausculation bilaterally. Cardiovascular: regular rate and rhythm. Fall risk is low.  PHQ 2 score is 0. Co exam with Dr Caroline Brooks.     Assessment:     1. Irregular periods  2. Thrombocytopenia (H[facility]) Check CBC  3. Screening examination for STD (sexually transmitted disease) Check HSV 1&2 antibodies   4. Menorrhagia with irregular cycle Follow up with Dr Caroline Brooks in 2 weeks Review handout ablation     Plan:     See Dr Caroline Brooks in 2 weeks

## 2019-08-24 ENCOUNTER — Telehealth: Payer: Self-pay | Admitting: Adult Health

## 2019-08-24 LAB — CBC
Hematocrit: 41.5 % (ref 34.0–46.6)
Hemoglobin: 14.2 g/dL (ref 11.1–15.9)
MCH: 30.5 pg (ref 26.6–33.0)
MCHC: 34.2 g/dL (ref 31.5–35.7)
MCV: 89 fL (ref 79–97)
Platelets: 49 10*3/uL — CL (ref 150–450)
RBC: 4.65 x10E6/uL (ref 3.77–5.28)
RDW: 12.4 % (ref 11.7–15.4)
WBC: 6.8 10*3/uL (ref 3.4–10.8)

## 2019-08-24 LAB — HSV 2 ANTIBODY, IGG: HSV 2 IgG, Type Spec: <0.91 index (ref 0.00–0.90)

## 2019-08-24 LAB — HSV 1 ANTIBODY, IGG: HSV 1 Glycoprotein G Ab, IgG: >62.2 index — ABNORMAL HIGH (ref 0.00–0.90)

## 2019-08-24 MED ORDER — VALACYCLOVIR HCL 1 G PO TABS: ORAL_TABLET | ORAL | 1 refills | Status: DC

## 2019-08-24 NOTE — Telephone Encounter (Signed)
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

## 2019-09-03 ENCOUNTER — Telehealth: Payer: Self-pay | Admitting: Obstetrics & Gynecology

## 2019-09-03 NOTE — Telephone Encounter (Signed)

## 2019-09-06 ENCOUNTER — Ambulatory Visit (INDEPENDENT_AMBULATORY_CARE_PROVIDER_SITE_OTHER): Payer: Medicaid Other | Admitting: Obstetrics & Gynecology

## 2019-09-06 ENCOUNTER — Other Ambulatory Visit: Payer: Self-pay

## 2019-09-06 ENCOUNTER — Encounter: Payer: Self-pay | Admitting: Obstetrics & Gynecology

## 2019-09-06 VITALS — BP 123/61 | HR 76 | Ht 68.2 in | Wt 160.0 lb

## 2019-09-06 DIAGNOSIS — D693 Immune thrombocytopenic purpura: Secondary | ICD-10-CM

## 2019-09-06 DIAGNOSIS — N921 Excessive and frequent menstruation with irregular cycle: Secondary | ICD-10-CM

## 2019-09-06 MED ORDER — MEGESTROL ACETATE 40 MG PO TABS: ORAL_TABLET | ORAL | 3 refills | Status: DC

## 2019-09-06 NOTE — Progress Notes (Signed)
Chief Complaint  Patient presents with  . Pre-op Exam    ablation      29 y.o. [id number] Patient's last menstrual period was 08/20/2019. The current method of family planning is tubal ligation.  Outpatient Encounter Medications as of 09/06/2019  Medication Sig  . megestrol (MEGACE) 40 MG tablet 1 tablet daily  . valACYclovir (VALTREX) 1000 MG tablet Take 2 at time feels cold sore and then 2 more in 24 hours (Patient not taking: Reported on 09/06/2019)   No facility-administered encounter medications on file as of 09/06/2019.    Subjective Pt with long history of ITP menometrorrhaiga chronically as well  No dyspareunia No recent sonogram Past Medical History:  Diagnosis Date  . Alcohol abuse   . Maternal alloimmune thrombocytopenia complicating pregnancy   . Narcotic abuse (HCC)   . Thrombocytopenia (HCC)   . Thrombocytopenia (HCC) 07/07/2019   07/06/19 platelets 38 ck platelet profile    Past Surgical History:  Procedure Laterality Date  . BILATERAL SALPINGECTOMY Bilateral 11/18/2014   Procedure: BILATERAL SALPINGECTOMY;  Surgeon: Lazaro Arms, MD;  Location: [facility];  Service: Obstetrics;  Laterality: Bilateral;  . CESAREAN SECTION  2011  . CESAREAN SECTION  11/26/2011   Procedure: CESAREAN SECTION;  Surgeon: Lazaro Arms, MD;  Location: [facility];  Service: Gynecology;  Laterality: N/A;  . CESAREAN SECTION N/A 11/18/2014   Procedure: CESAREAN SECTION;  Surgeon: Lazaro Arms, MD;  Location: [facility];  Service: Obstetrics;  Laterality: N/A;    OB History    Gravida  3   Para  3   Term  3   Preterm  0   AB  0   Living  3     SAB  0   TAB  0   Ectopic  0   Multiple  0   Live Births  2           No Known Allergies  Social History   Socioeconomic History  . Marital status: Married    Spouse name: Not on file  . Number of children: 3  . Years of education: Not on file  . Highest education level: Not on file  Occupational History  .  Occupation: umemployed  Tobacco Use  . Smoking status: Current Some Day Smoker    Packs/day: 0.00    Years: 5.00    Pack years: 0.00    Types: Cigarettes  . Smokeless tobacco: Never Used  Substance and Sexual Activity  . Alcohol use: Not Currently  . Drug use: No  . Sexual activity: Yes    Birth control/protection: Surgical  Other Topics Concern  . Not on file  Social History Narrative  . Not on file   Social Determinants of Health   Financial Resource Strain:   . Difficulty of Paying Living Expenses:   Food Insecurity:   . Worried About Programme researcher, broadcasting/film/video in the Last Year:   . Barista in the Last Year:   Transportation Needs:   . Freight forwarder (Medical):   [name] Lack of Transportation (Non-Medical):   Physical Activity:   . Days of Exercise per Week:   . Minutes of Exercise per Session:   Stress:   . Feeling of Stress :   Social Connections:   . Frequency of Communication with Friends and Family:   . Frequency of Social Gatherings with Friends and Family:   . Attends Religious Services:   . Active Member  of Clubs or Organizations:   . Attends Archivist Meetings:   [name] Marital Status:     Family History  Problem Relation Age of Onset  . Cancer Mother 50       breast   . Hypertension Father   . Diabetes Father   . Other Brother        Alpha 1  . Cancer Maternal Grandfather        prostate  . Hypertension Maternal Grandfather   . Early death Paternal Grandfather   . Alcohol abuse Paternal Grandfather   . Healthy Daughter   . Anemia Maternal Grandmother   . Other Daughter        pulmonary stenosis  . Healthy Son     Medications:       Current Outpatient Medications:  .  megestrol (MEGACE) 40 MG tablet, 1 tablet daily, Disp: 30 tablet, Rfl: 3 .  valACYclovir (VALTREX) 1000 MG tablet, Take 2 at time feels cold sore and then 2 more in 24 hours (Patient not taking: Reported on 09/06/2019), Disp: 30 tablet, Rfl: 1  Objective Blood  pressure 123/61, pulse 76, height 5' 8.2" (1.732 m), weight 160 lb (72.6 kg), last menstrual period 08/20/2019.  Gen [facility] NAD  Pertinent ROS No burning with urination, frequency or urgency No nausea, vomiting or diarrhea Nor fever chills or other constitutional symptoms   Labs or studies reviewed    Impression Diagnoses this Encounter::   ICD-10-CM   1. Menorrhagia with irregular cycle  N92.1   2. Idiopathic thrombocytopenic purpura (ITP) (HCC)  D69.3     Established relevant diagnosis(es):   Plan/Recommendations: Meds ordered this encounter  Medications  . megestrol (MEGACE) 40 MG tablet    Sig: 1 tablet daily    Dispense:  30 tablet    Refill:  3    Labs or Scans Ordered: No orders of the defined types were placed in this encounter.   Management:: >sonogram of pelvis >megestrol therapy  >conisder endometrial ablation  Follow up Return in about 2 weeks (around 09/20/2019) for GYN sono.        Face to face time:  15 minutes  Greater than 50% of the visit time was spent in counseling and coordination of care with the patient.  The summary and outline of the counseling and care coordination is summarized in the note above.   All questions were answered.

## 2019-09-06 NOTE — Addendum Note (Signed)
Addended by: Lazaro Arms on: 09/06/2019 11:27 AM   Modules accepted: Orders

## 2019-09-16 ENCOUNTER — Telehealth: Payer: Self-pay | Admitting: Obstetrics & Gynecology

## 2019-09-16 NOTE — Telephone Encounter (Signed)

## 2019-09-20 ENCOUNTER — Telehealth: Payer: Self-pay | Admitting: Obstetrics & Gynecology

## 2019-09-20 ENCOUNTER — Ambulatory Visit (INDEPENDENT_AMBULATORY_CARE_PROVIDER_SITE_OTHER): Payer: Medicaid Other

## 2019-09-20 ENCOUNTER — Other Ambulatory Visit: Payer: Self-pay

## 2019-09-20 DIAGNOSIS — N921 Excessive and frequent menstruation with irregular cycle: Secondary | ICD-10-CM | POA: Diagnosis not present

## 2019-09-20 DIAGNOSIS — D693 Immune thrombocytopenic purpura: Secondary | ICD-10-CM | POA: Diagnosis not present

## 2019-09-20 NOTE — Telephone Encounter (Signed)
Patient just had an [place] today, Patient states that she will be getting a phone call from Dr. Despina Hidden. Tried schedule a televisit for him but no opening.

## 2019-09-20 NOTE — Progress Notes (Signed)
PELVIC US TA/TV: homogeneous anteverted uterus,wnl,EEC 10.9 mm,normal ovaries,ovaries appear mobile,no pain during ultrasound,no free fluid

## 2019-10-11 ENCOUNTER — Telehealth: Payer: Self-pay | Admitting: Obstetrics & Gynecology

## 2019-10-11 NOTE — Telephone Encounter (Signed)
Pt called and said she was waiting for a call for a Endo Ablation. I read the notes and do not see that he has sent me anything to schedule yet. Can you find out from Dr Despina Hidden what the next step is for this pt.

## 2019-10-12 NOTE — Telephone Encounter (Signed)
Telephoned patient at home number and left message.  

## 2019-10-12 NOTE — Telephone Encounter (Signed)
Since Caroline Brooks was out last week we had some miscommunication probably on my part  I have sent the info to Caroline Brooks and she is getting it taken care of now  Please let the patient know

## 2019-10-14 ENCOUNTER — Other Ambulatory Visit (HOSPITAL_COMMUNITY): Payer: Self-pay | Admitting: Nurse Practitioner

## 2019-10-14 DIAGNOSIS — D696 Thrombocytopenia, unspecified: Secondary | ICD-10-CM

## 2019-10-15 ENCOUNTER — Other Ambulatory Visit: Payer: Self-pay

## 2019-10-15 ENCOUNTER — Inpatient Hospital Stay (HOSPITAL_COMMUNITY): Payer: Medicaid Other | Attending: Hematology

## 2019-10-15 ENCOUNTER — Inpatient Hospital Stay (HOSPITAL_BASED_OUTPATIENT_CLINIC_OR_DEPARTMENT_OTHER): Payer: Medicaid Other | Admitting: Nurse Practitioner

## 2019-10-15 DIAGNOSIS — D693 Immune thrombocytopenic purpura: Secondary | ICD-10-CM | POA: Insufficient documentation

## 2019-10-15 DIAGNOSIS — Z79899 Other long term (current) drug therapy: Secondary | ICD-10-CM | POA: Diagnosis not present

## 2019-10-15 DIAGNOSIS — D696 Thrombocytopenia, unspecified: Secondary | ICD-10-CM

## 2019-10-15 DIAGNOSIS — Z9079 Acquired absence of other genital organ(s): Secondary | ICD-10-CM | POA: Diagnosis not present

## 2019-10-15 DIAGNOSIS — Z8249 Family history of ischemic heart disease and other diseases of the circulatory system: Secondary | ICD-10-CM | POA: Insufficient documentation

## 2019-10-15 DIAGNOSIS — Z833 Family history of diabetes mellitus: Secondary | ICD-10-CM | POA: Insufficient documentation

## 2019-10-15 DIAGNOSIS — F17210 Nicotine dependence, cigarettes, uncomplicated: Secondary | ICD-10-CM | POA: Insufficient documentation

## 2019-10-15 DIAGNOSIS — Z803 Family history of malignant neoplasm of breast: Secondary | ICD-10-CM | POA: Insufficient documentation

## 2019-10-15 LAB — CBC WITH DIFFERENTIAL/PLATELET
Abs Immature Granulocytes: 0.01 10*3/uL (ref 0.00–0.07)
Basophils Absolute: 0.1 10*3/uL (ref 0.0–0.1)
Basophils Relative: 1 %
Eosinophils Absolute: 0.1 10*3/uL (ref 0.0–0.5)
Eosinophils Relative: 1 %
HCT: 44.9 % (ref 36.0–46.0)
Hemoglobin: 14.5 g/dL (ref 12.0–15.0)
Immature Granulocytes: 0 %
Lymphocytes Relative: 32 %
Lymphs Abs: 2.2 10*3/uL (ref 0.7–4.0)
MCH: 30.5 pg (ref 26.0–34.0)
MCHC: 32.3 g/dL (ref 30.0–36.0)
MCV: 94.3 fL (ref 80.0–100.0)
Monocytes Absolute: 0.5 10*3/uL (ref 0.1–1.0)
Monocytes Relative: 7 %
Neutro Abs: 4 10*3/uL (ref 1.7–7.7)
Neutrophils Relative %: 59 %
Platelets: 39 10*3/uL — ABNORMAL LOW (ref 150–400)
RBC: 4.76 MIL/uL (ref 3.87–5.11)
RDW: 13.2 % (ref 11.5–15.5)
WBC: 6.8 10*3/uL (ref 4.0–10.5)
nRBC: 0 % (ref 0.0–0.2)

## 2019-10-15 LAB — COMPREHENSIVE METABOLIC PANEL
ALT: 13 U/L (ref 0–44)
AST: 15 U/L (ref 15–41)
Albumin: 4.6 g/dL (ref 3.5–5.0)
Alkaline Phosphatase: 63 U/L (ref 38–126)
Anion gap: 8 (ref 5–15)
BUN: 12 mg/dL (ref 6–20)
CO2: 27 mmol/L (ref 22–32)
Calcium: 9.4 mg/dL (ref 8.9–10.3)
Chloride: 104 mmol/L (ref 98–111)
Creatinine, Ser: 0.83 mg/dL (ref 0.44–1.00)
GFR calc Af Amer: >60 mL/min (ref >60)
GFR calc non Af Amer: >60 mL/min (ref >60)
Glucose, Bld: 127 mg/dL — ABNORMAL HIGH (ref 70–99)
Potassium: 4.9 mmol/L (ref 3.5–5.1)
Sodium: 139 mmol/L (ref 135–145)
Total Bilirubin: 0.4 mg/dL (ref 0.3–1.2)
Total Protein: 7.5 g/dL (ref 6.5–8.1)

## 2019-10-15 LAB — LACTATE DEHYDROGENASE: LDH: 126 U/L (ref 98–192)

## 2019-10-15 NOTE — Progress Notes (Signed)
[address] [place], [place] [address]   CLINIC:  Medical Oncology/Hematology  PCP:  [facility] [address] [place] [address] [phone]   REASON FOR VISIT: Follow-up for thrombocytopenia   CURRENT THERAPY: Clinical observation   INTERVAL HISTORY:  Caroline Brooks 29 y.o. female returns for routine follow-up for thrombocytopenia.  Patient reports she is doing well since her last visit.  She denies any bright red bleeding per rectum or melena.  She does report easy bruising. Denies any nausea, vomiting, or diarrhea. Denies any new pains. Had not noticed any recent bleeding such as epistaxis, hematuria or hematochezia. Denies recent chest pain on exertion, shortness of breath on minimal exertion, pre-syncopal episodes, or palpitations. Denies any numbness or tingling in hands or feet. Denies any recent fevers, infections, or recent hospitalizations. Patient reports appetite at 100% and energy level at 100%.  She is eating well maintain her weight at this time.    REVIEW OF SYSTEMS:  Review of Systems  Psychiatric/Behavioral: The patient is nervous/anxious.   All other systems reviewed and are negative.    PAST MEDICAL/SURGICAL HISTORY:  Past Medical History:  Diagnosis Date  . Alcohol abuse   . Maternal alloimmune thrombocytopenia complicating pregnancy   . Narcotic abuse ([place])   . Thrombocytopenia ([place])   . Thrombocytopenia ([place]) 07/07/2019   07/06/19 platelets 38 ck platelet profile   Past Surgical History:  Procedure Laterality Date  . BILATERAL SALPINGECTOMY Bilateral 11/18/2014   Procedure: BILATERAL SALPINGECTOMY;  Surgeon: Florian Buff, MD;  Location: [facility];  Service: Obstetrics;  Laterality: Bilateral;  . CESAREAN SECTION  2011  . CESAREAN SECTION  11/26/2011   Procedure: CESAREAN SECTION;  Surgeon: Florian Buff, MD;  Location: [facility];  Service: Gynecology;  Laterality: N/A;  . CESAREAN SECTION N/A  11/18/2014   Procedure: CESAREAN SECTION;  Surgeon: Florian Buff, MD;  Location: [facility];  Service: Obstetrics;  Laterality: N/A;     SOCIAL HISTORY:  Social History   Socioeconomic History  . Marital status: Married    Spouse name: Not on file  . Number of children: 3  . Years of education: Not on file  . Highest education level: Not on file  Occupational History  . Occupation: umemployed  Tobacco Use  . Smoking status: Current Some Day Smoker    Packs/day: 0.00    Years: 5.00    Pack years: 0.00    Types: Cigarettes  . Smokeless tobacco: Never Used  Substance and Sexual Activity  . Alcohol use: Not Currently  . Drug use: No  . Sexual activity: Yes    Birth control/protection: Surgical  Other Topics Concern  . Not on file  Social History Narrative  . Not on file   Social Determinants of Health   Financial Resource Strain:   . Difficulty of Paying Living Expenses:   Food Insecurity:   . Worried About Charity fundraiser in the Last Year:   . Arboriculturist in the Last Year:   Transportation Needs:   . Film/video editor (Medical):   [name] Lack of Transportation (Non-Medical):   Physical Activity:   . Days of Exercise per Week:   . Minutes of Exercise per Session:   Stress:   . Feeling of Stress :   Social Connections:   . Frequency of Communication with Friends and Family:   . Frequency of Social Gatherings with Friends and Family:   .  Attends Religious Services:   . Active Member of Clubs or Organizations:   . Attends Banker Meetings:   [name] Marital Status:   Intimate Partner Violence:   . Fear of Current or Ex-Partner:   . Emotionally Abused:   [name] Physically Abused:   . Sexually Abused:     FAMILY HISTORY:  Family History  Problem Relation Age of Onset  . Cancer Mother 57       breast   . Hypertension Father   . Diabetes Father   . Other Brother        Alpha 1  . Cancer Maternal Grandfather        prostate  . Hypertension Maternal  Grandfather   . Early death Paternal Grandfather   . Alcohol abuse Paternal Grandfather   . Healthy Daughter   . Anemia Maternal Grandmother   . Other Daughter        pulmonary stenosis  . Healthy Son     CURRENT MEDICATIONS:  Outpatient Encounter Medications as of 10/15/2019  Medication Sig  . megestrol (MEGACE) 40 MG tablet 1 tablet daily  . valACYclovir (VALTREX) 1000 MG tablet Take 2 at time feels cold sore and then 2 more in 24 hours (Patient not taking: Reported on 09/06/2019)   No facility-administered encounter medications on file as of 10/15/2019.    ALLERGIES:  No Known Allergies   PHYSICAL EXAM:  ECOG Performance status: 1  Vitals:   10/15/19 1052  BP: 129/64  Pulse: 86  Resp: 17  Temp: (!) 97.5 F (36.4 C)  SpO2: 98%   Filed Weights   10/15/19 1052  Weight: 163 lb 11.2 oz (74.3 kg)   Physical Exam Constitutional:      Appearance: Normal appearance. She is normal weight.  Musculoskeletal:        General: Normal range of motion.  Skin:    General: Skin is warm.  Neurological:     Mental Status: She is alert and oriented to person, place, and time. Mental status is at baseline.  Psychiatric:        Mood and Affect: Mood normal.        Behavior: Behavior normal.        Thought Content: Thought content normal.        Judgment: Judgment normal.      LABORATORY DATA:  I have reviewed the labs as listed.  CBC    Component Value Date/Time   WBC 6.8 10/15/2019 1136   RBC 4.76 10/15/2019 1136   HGB 14.5 10/15/2019 1136   HGB 14.2 08/23/2019 1117   HCT 44.9 10/15/2019 1136   HCT 41.5 08/23/2019 1117   PLT 39 (L) 10/15/2019 1136   PLT 49 (LL) 08/23/2019 1117   MCV 94.3 10/15/2019 1136   MCV 89 08/23/2019 1117   MCH 30.5 10/15/2019 1136   MCHC 32.3 10/15/2019 1136   RDW 13.2 10/15/2019 1136   RDW 12.4 08/23/2019 1117   LYMPHSABS 2.2 10/15/2019 1136   LYMPHSABS 2.9 07/05/2019 1226   MONOABS 0.5 10/15/2019 1136   EOSABS 0.1 10/15/2019 1136    EOSABS 0.0 07/05/2019 1226   BASOSABS 0.1 10/15/2019 1136   BASOSABS 0.1 07/05/2019 1226   CMP Latest Ref Rng & Units 10/15/2019 07/05/2019 03/27/2016  Glucose 70 - 99 mg/dL 875(I) 92 94  BUN 6 - 20 mg/dL 12 9 7   Creatinine 0.44 - 1.00 mg/dL 4.33 2.95  Sodium 135 - 145 mmol/L 139 140 138  Potassium 3.5 -  5.1 mmol/L 4.9 4.5 4.1  Chloride 98 - 111 mmol/L 104 103 104  CO2 22 - 32 mmol/L 27 24 29   Calcium 8.9 - 10.3 mg/dL 9.4 9.3 9.3  Total Protein 6.5 - 8.1 g/dL 7.5 6.9 7.4  Total Bilirubin 0.3 - 1.2 mg/dL 0.4 0.3 0.5  Alkaline Phos 38 - 126 U/L 63 84 82  AST 15 - 41 U/L 15 14 15   ALT 0 - 44 U/L 13 6 13(L)    All questions were answered to patient's stated satisfaction. Encouraged patient to call with any new concerns or questions before his next visit to the cancer center and we can certain see him sooner, if needed.     ASSESSMENT & PLAN:  Thrombocytopenia (HCC) 1.  ITP: -She has a history of thrombocytopenia dating back to 2016 when her platelet count ranged between 40 and 70. -She reportedly had thrombocytopenia during her pregnancy also. -She reported having menses lasting 2-1/2 days every 2 weeks for the past few years.  No increase in the bleeding. -She also reports very easy bruising particular in the upper thighs for the past 10 years and stable. 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  She reports that she has been clean for 3 years. -An ultrasound of her abdomen on 04/05/2016 showed normal-sized spleen.  No other pathology seen. -CBC on 07/05/2019 showed platelet count of 38. -Nutritional deficiency work-up was negative.  Hepatitis C antibody was positive.  We checked a hepatitis C RNA PCR which was negative. -She was told to follow-up with her GYN for procedures to decrease bleeding. -If she does need platelet count more than 50 for prior procedure we will treat give a trial of dexamethasone 40 mg x 4 days. -Labs done on  10/15/2019 showed a platelet count of 39. -We will see her back in 6 weeks with repeat labs.     Orders placed this encounter:  Orders Placed This Encounter  Procedures  . Lactate dehydrogenase  . CBC with Differential/Platelet  . Comprehensive metabolic panel      09/02/2019, FNP-C [facility] [phone]

## 2019-10-15 NOTE — Assessment & Plan Note (Addendum)
1.  ITP: -She has a history of thrombocytopenia dating back to 2016 when her platelet count ranged between 40 and 70. -She reportedly had thrombocytopenia during her pregnancy also. -She reported having menses lasting 2-1/2 days every 2 weeks for the past few years.  No increase in the bleeding. -She also reports very easy bruising particular in the upper thighs for the past 10 years and stable. 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  She reports that she has been clean for 3 years. -An ultrasound of her abdomen on 04/05/2016 showed normal-sized spleen.  No other pathology seen. -CBC on 07/05/2019 showed platelet count of 38. -Nutritional deficiency work-up was negative.  Hepatitis C antibody was positive.  We checked a hepatitis C RNA PCR which was negative. -She was told to follow-up with her GYN for procedures to decrease bleeding. -If she does need platelet count more than 50 for prior procedure we will treat give a trial of dexamethasone 40 mg x 4 days. -Labs done on 10/15/2019 showed a platelet count of 39. -We will see her back in 6 weeks with repeat labs.

## 2019-11-01 ENCOUNTER — Other Ambulatory Visit: Payer: Self-pay | Admitting: Obstetrics & Gynecology

## 2019-11-01 ENCOUNTER — Other Ambulatory Visit (HOSPITAL_COMMUNITY)
Admission: RE | Admit: 2019-11-01 | Discharge: 2019-11-01 | Disposition: A | Discharge status: Home / Self Care | Payer: Medicaid Other | Source: Ambulatory Visit | Attending: Obstetrics & Gynecology | Admitting: Obstetrics & Gynecology

## 2019-11-01 ENCOUNTER — Other Ambulatory Visit: Payer: Self-pay

## 2019-11-01 ENCOUNTER — Encounter (HOSPITAL_COMMUNITY)
Admission: RE | Admit: 2019-11-01 | Discharge: 2019-11-01 | Disposition: A | Discharge status: Home / Self Care | Payer: Medicaid Other | Source: Ambulatory Visit | Attending: Obstetrics & Gynecology | Admitting: Obstetrics & Gynecology

## 2019-11-01 ENCOUNTER — Encounter (HOSPITAL_COMMUNITY): Payer: Self-pay

## 2019-11-01 DIAGNOSIS — Z20822 Contact with and (suspected) exposure to covid-19: Secondary | ICD-10-CM | POA: Diagnosis not present

## 2019-11-01 DIAGNOSIS — Z01812 Encounter for preprocedural laboratory examination: Secondary | ICD-10-CM | POA: Diagnosis not present

## 2019-11-01 LAB — CBC
HCT: 41.9 % (ref 36.0–46.0)
Hemoglobin: 13.4 g/dL (ref 12.0–15.0)
MCH: 30 pg (ref 26.0–34.0)
MCHC: 32 g/dL (ref 30.0–36.0)
MCV: 93.7 fL (ref 80.0–100.0)
Platelets: 37 10*3/uL — ABNORMAL LOW (ref 150–400)
RBC: 4.47 MIL/uL (ref 3.87–5.11)
RDW: 13.1 % (ref 11.5–15.5)
WBC: 6.1 10*3/uL (ref 4.0–10.5)
nRBC: 0 % (ref 0.0–0.2)

## 2019-11-01 LAB — COMPREHENSIVE METABOLIC PANEL
ALT: 19 U/L (ref 0–44)
AST: 18 U/L (ref 15–41)
Albumin: 4.6 g/dL (ref 3.5–5.0)
Alkaline Phosphatase: 63 U/L (ref 38–126)
Anion gap: 8 (ref 5–15)
BUN: 14 mg/dL (ref 6–20)
CO2: 24 mmol/L (ref 22–32)
Calcium: 9.2 mg/dL (ref 8.9–10.3)
Chloride: 101 mmol/L (ref 98–111)
Creatinine, Ser: 0.78 mg/dL (ref 0.44–1.00)
GFR calc Af Amer: >60 mL/min (ref >60)
GFR calc non Af Amer: >60 mL/min (ref >60)
Glucose, Bld: 127 mg/dL — ABNORMAL HIGH (ref 70–99)
Potassium: 4 mmol/L (ref 3.5–5.1)
Sodium: 133 mmol/L — ABNORMAL LOW (ref 135–145)
Total Bilirubin: 0.5 mg/dL (ref 0.3–1.2)
Total Protein: 7.2 g/dL (ref 6.5–8.1)

## 2019-11-01 LAB — RAPID HIV SCREEN (HIV 1/2 AB+AG)
HIV 1/2 Antibodies: NONREACTIVE
HIV-1 P24 Antigen - HIV24: NONREACTIVE

## 2019-11-01 LAB — URINALYSIS, ROUTINE W REFLEX MICROSCOPIC
Bilirubin Urine: NEGATIVE
Glucose, UA: NEGATIVE mg/dL
Hgb urine dipstick: NEGATIVE
Ketones, ur: 5 mg/dL — AB
Leukocytes,Ua: NEGATIVE
Nitrite: NEGATIVE
Protein, ur: NEGATIVE mg/dL
Specific Gravity, Urine: 1.04 — ABNORMAL HIGH (ref 1.005–1.030)
pH: 5 (ref 5.0–8.0)

## 2019-11-01 LAB — HCG, QUANTITATIVE, PREGNANCY: hCG, Beta Chain, Quant, S: <1 m[IU]/mL (ref <5)

## 2019-11-01 NOTE — Patient Instructions (Signed)
Your procedure is scheduled on: 11/03/2019  Report to [facility] at 8:30    AM.  Call this number if you have problems the morning of surgery: [phone]   Remember:   Do not Eat or Drink after midnight         No Smoking the morning of surgery  :  Take these medicines the morning of surgery with A SIP OF WATER: none  Do not wear jewelry, make-up or nail polish.  Do not wear lotions, powders, or perfumes. You may wear deodorant.  Do not shave 48 hours prior to surgery. Men may shave face and neck.  Do not bring valuables to the hospital.  Contacts, dentures or bridgework may not be worn into surgery.  Leave suitcase in the car. After surgery it may be brought to your room.  For patients admitted to the hospital, checkout time is 11:00 AM the day of discharge.   Patients discharged the day of surgery will not be allowed to drive home.    Special Instructions: Shower using CHG night before surgery and shower the day of surgery use CHG.  Use special wash - you have one bottle of CHG for all showers.  You should use approximately 1/2 of the bottle for each shower.  Dilation and Curettage, Care After These instructions give you information about caring for yourself after your procedure. Your doctor may also give you more specific instructions. Call your doctor if you have any problems or questions after your procedure. Follow these instructions at home: Activity  Do not drive or use heavy machinery while taking prescription pain medicine.  For 24 hours after your procedure, avoid driving.  Take short walks often, followed by rest periods. Ask your doctor what activities are safe for you. After one or two days, you may be able to return to your normal activities.  Do not lift anything that is heavier than 10 lb (4.5 kg) until your doctor approves.  For at least 2 weeks, or as long as told by your doctor: ? Do not douche. ? Do not use tampons. ? Do not have sex. General  instructions   Take over-the-counter and prescription medicines only as told by your doctor. This is very important if you take blood thinning medicine.  Do not take baths, swim, or use a hot tub until your doctor approves. Take showers instead of baths.  Wear compression stockings as told by your doctor.  It is up to you to get the results of your procedure. Ask your doctor when your results will be ready.  Keep all follow-up visits as told by your doctor. This is important. Contact a doctor if:  You have very bad cramps that get worse or do not get better with medicine.  You have very bad pain in your belly (abdomen).  You cannot drink fluids without throwing up (vomiting).  You get pain in a different part of the area between your belly and thighs (pelvis).  You have bad-smelling discharge from your vagina.  You have a rash. Get help right away if:  You are bleeding a lot from your vagina. A lot of bleeding means soaking more than one sanitary pad in an hour, for 2 hours in a row.  You have clumps of blood (blood clots) coming from your vagina.  You have a fever or chills.  Your belly feels very tender or hard.  You have chest pain.  You have trouble breathing.  You cough up blood.  You feel dizzy.  You feel light-headed.  You pass out (faint).  You have pain in your neck or shoulder area. Summary  Take short walks often, followed by rest periods. Ask your doctor what activities are safe for you. After one or two days, you may be able to return to your normal activities.  Do not lift anything that is heavier than 10 lb (4.5 kg) until your doctor approves.  Do not take baths, swim, or use a hot tub until your doctor approves. Take showers instead of baths.  Contact your doctor if you have any symptoms of infection, like bad-smelling discharge from your vagina. This information is not intended to replace advice given to you by your health care provider. Make  sure you discuss any questions you have with your health care provider. Document Revised: 05/23/2017 Document Reviewed: 02/26/2016 Elsevier Patient Education  2020 Elsevier Inc[employer]  Endometrial Ablation Endometrial ablation is a procedure that destroys the thin inner layer of the lining of the uterus (endometrium). This procedure may be done:  To stop heavy periods.  To stop bleeding that is causing anemia.  To control irregular bleeding.  To treat bleeding caused by small tumors (fibroids) in the endometrium. This procedure is often an alternative to major surgery, such as removal of the uterus and cervix (hysterectomy). As a result of this procedure:  You may not be able to have children. However, if you are premenopausal (you have not gone through menopause): ? You may still have a small chance of getting pregnant. ? You will need to use a reliable method of birth control after the procedure to prevent pregnancy.  You may stop having a menstrual period, or you may have only a small amount of bleeding during your period. Menstruation may return several years after the procedure. Tell a health care provider about:  Any allergies you have.  All medicines you are taking, including vitamins, herbs, eye drops, creams, and over-the-counter medicines.  Any problems you or family members have had with the use of anesthetic medicines.  Any blood disorders you have.  Any surgeries you have had.  Any medical conditions you have. What are the risks? Generally, this is a safe procedure. However, problems may occur, including:  A hole (perforation) in the uterus or bowel.  Infection of the uterus, bladder, or vagina.  Bleeding.  Damage to other structures or organs.  An air bubble in the lung (air embolus).  Problems with pregnancy after the procedure.  Failure of the procedure.  Decreased ability to diagnose cancer in the endometrium. What happens before the procedure?  You  will have tests of your endometrium to make sure there are no pre-cancerous cells or cancer cells present.  You may have an ultrasound of the uterus.  You may be given medicines to thin the endometrium.  Ask your health care provider about: ? Changing or stopping your regular medicines. This is especially important if you take diabetes medicines or blood thinners. ? Taking medicines such as aspirin and ibuprofen. These medicines can thin your blood. Do not take these medicines before your procedure if your doctor tells you not to.  Plan to have someone take you home from the hospital or clinic. What happens during the procedure?   You will lie on an exam table with your feet and legs supported as in a pelvic exam.  To lower your risk of infection: ? Your health care team will wash or sanitize their hands and put on  germ-free (sterile) gloves. ? Your genital area will be washed with soap.  An IV tube will be inserted into one of your veins.  You will be given a medicine to help you relax (sedative).  A surgical instrument with a light and camera (resectoscope) will be inserted into your vagina and moved into your uterus. This allows your surgeon to see inside your uterus.  Endometrial tissue will be removed using one of the following methods: ? Radiofrequency. This method uses a radiofrequency-alternating electric current to remove the endometrium. ? Cryotherapy. This method uses extreme cold to freeze the endometrium. ? Heated-free liquid. This method uses a heated saltwater (saline) solution to remove the endometrium. ? Microwave. This method uses high-energy microwaves to heat up the endometrium and remove it. ? Thermal balloon. This method involves inserting a catheter with a balloon tip into the uterus. The balloon tip is filled with heated fluid to remove the endometrium. The procedure may vary among health care providers and hospitals. What happens after the procedure?  Your  blood pressure, heart rate, breathing rate, and blood oxygen level will be monitored until the medicines you were given have worn off.  As tissue healing occurs, you may notice vaginal bleeding for 4-6 weeks after the procedure. You may also experience: ? Cramps. ? Thin, watery vaginal discharge that is light pink or brown in color. ? A need to urinate more frequently than usual. ? Nausea.  Do not drive for 24 hours if you were given a sedative.  Do not have sex or insert anything into your vagina until your health care provider approves. Summary  Endometrial ablation is done to treat the many causes of heavy menstrual bleeding.  The procedure may be done only after medications have been tried to control the bleeding.  Plan to have someone take you home from the hospital or clinic. This information is not intended to replace advice given to you by your health care provider. Make sure you discuss any questions you have with your health care provider. Document Revised: 11/25/2017 Document Reviewed: 06/27/2016 Elsevier Patient Education  2020 Elsevier Inc.  General Anesthesia, Adult, Care After This sheet gives you information about how to care for yourself after your procedure. Your health care provider may also give you more specific instructions. If you have problems or questions, contact your health care provider. What can I expect after the procedure? After the procedure, the following side effects are common:  Pain or discomfort at the IV site.  Nausea.  Vomiting.  Sore throat.  Trouble concentrating.  Feeling cold or chills.  Weak or tired.  Sleepiness and fatigue.  Soreness and body aches. These side effects can affect parts of the body that were not involved in surgery. Follow these instructions at home:  For at least 24 hours after the procedure:  Have a responsible adult stay with you. It is important to have someone help care for you until you are awake and  alert.  Rest as needed.  Do not: ? Participate in activities in which you could fall or become injured. ? Drive. ? Use heavy machinery. ? Drink alcohol. ? Take sleeping pills or medicines that cause drowsiness. ? Make important decisions or sign legal documents. ? Take care of children on your own. Eating and drinking  Follow any instructions from your health care provider about eating or drinking restrictions.  When you feel hungry, start by eating small amounts of foods that are soft and easy to digest (bland),  such as toast. Gradually return to your regular diet.  Drink enough fluid to keep your urine pale yellow.  If you vomit, rehydrate by drinking water, juice, or clear broth. General instructions  If you have sleep apnea, surgery and certain medicines can increase your risk for breathing problems. Follow instructions from your health care provider about wearing your sleep device: ? Anytime you are sleeping, including during daytime naps. ? While taking prescription pain medicines, sleeping medicines, or medicines that make you drowsy.  Return to your normal activities as told by your health care provider. Ask your health care provider what activities are safe for you.  Take over-the-counter and prescription medicines only as told by your health care provider.  If you smoke, do not smoke without supervision.  Keep all follow-up visits as told by your health care provider. This is important. Contact a health care provider if:  You have nausea or vomiting that does not get better with medicine.  You cannot eat or drink without vomiting.  You have pain that does not get better with medicine.  You are unable to pass urine.  You develop a skin rash.  You have a fever.  You have redness around your IV site that gets worse. Get help right away if:  You have difficulty breathing.  You have chest pain.  You have blood in your urine or stool, or you vomit  blood. Summary  After the procedure, it is common to have a sore throat or nausea. It is also common to feel tired.  Have a responsible adult stay with you for the first 24 hours after general anesthesia. It is important to have someone help care for you until you are awake and alert.  When you feel hungry, start by eating small amounts of foods that are soft and easy to digest (bland), such as toast. Gradually return to your regular diet.  Drink enough fluid to keep your urine pale yellow.  Return to your normal activities as told by your health care provider. Ask your health care provider what activities are safe for you. This information is not intended to replace advice given to you by your health care provider. Make sure you discuss any questions you have with your health care provider. Document Revised: 06/13/2017 Document Reviewed: 01/24/2017 Elsevier Patient Education  2020 [employer].

## 2019-11-02 LAB — SARS CORONAVIRUS 2 (TAT 6-24 HRS): SARS Coronavirus 2: NEGATIVE

## 2019-11-03 ENCOUNTER — Ambulatory Visit (HOSPITAL_COMMUNITY): Payer: Medicaid Other | Admitting: Anesthesiology

## 2019-11-03 ENCOUNTER — Encounter (HOSPITAL_COMMUNITY): Payer: Self-pay | Admitting: Obstetrics & Gynecology

## 2019-11-03 ENCOUNTER — Ambulatory Visit (HOSPITAL_COMMUNITY)
Admission: RE | Admit: 2019-11-03 | Discharge: 2019-11-03 | Disposition: A | Discharge status: Home / Self Care | Payer: Medicaid Other | Attending: Obstetrics & Gynecology | Admitting: Obstetrics & Gynecology

## 2019-11-03 ENCOUNTER — Encounter (HOSPITAL_COMMUNITY)
Admission: RE | Disposition: A | Discharge status: Home / Self Care | Payer: Self-pay | Source: Home / Self Care | Attending: Obstetrics & Gynecology

## 2019-11-03 ENCOUNTER — Other Ambulatory Visit: Payer: Self-pay | Admitting: Obstetrics & Gynecology

## 2019-11-03 DIAGNOSIS — D693 Immune thrombocytopenic purpura: Secondary | ICD-10-CM | POA: Insufficient documentation

## 2019-11-03 DIAGNOSIS — F17210 Nicotine dependence, cigarettes, uncomplicated: Secondary | ICD-10-CM | POA: Insufficient documentation

## 2019-11-03 DIAGNOSIS — N946 Dysmenorrhea, unspecified: Secondary | ICD-10-CM | POA: Diagnosis not present

## 2019-11-03 DIAGNOSIS — N920 Excessive and frequent menstruation with regular cycle: Secondary | ICD-10-CM | POA: Diagnosis not present

## 2019-11-03 HISTORY — PX: DILATATION AND CURETTAGE/HYSTEROSCOPY WITH MINERVA: SHX6851

## 2019-11-03 SURGERY — DILATATION AND CURETTAGE/HYSTEROSCOPY WITH MINERVA
Anesthesia: General

## 2019-11-03 MED ORDER — CYCLOBENZAPRINE HCL 10 MG PO TABS
10.0000 mg | ORAL_TABLET | Freq: Three times a day (TID) | ORAL | 1 refills | Status: DC | PRN
Start: 2019-11-03 — End: 2021-04-20

## 2019-11-03 MED ORDER — FENTANYL CITRATE (PF) 100 MCG/2ML IJ SOLN
INTRAMUSCULAR | Status: DC | PRN
  Administered 2019-11-03 (×2): 50 ug via INTRAVENOUS
  Administered 2019-11-03: 100 ug via INTRAVENOUS
  Administered 2019-11-03: 50 ug via INTRAVENOUS

## 2019-11-03 MED ORDER — SODIUM CHLORIDE 0.9 % IR SOLN
Status: DC | PRN
  Administered 2019-11-03: 3000 mL

## 2019-11-03 MED ORDER — CEFAZOLIN SODIUM-DEXTROSE 2-4 GM/100ML-% IV SOLN
INTRAVENOUS | Status: AC
  Filled 2019-11-03: qty 100

## 2019-11-03 MED ORDER — LIDOCAINE HCL (CARDIAC) PF 100 MG/5ML IV SOSY
PREFILLED_SYRINGE | INTRAVENOUS | Status: DC | PRN
  Administered 2019-11-03: 80 mg via INTRAVENOUS

## 2019-11-03 MED ORDER — KETOROLAC TROMETHAMINE 30 MG/ML IJ SOLN: 30.0000 mg | Freq: Once | INTRAMUSCULAR | Status: DC

## 2019-11-03 MED ORDER — HYDROMORPHONE HCL 1 MG/ML IJ SOLN
0.2500 mg | INTRAMUSCULAR | Status: DC | PRN
  Administered 2019-11-03 (×4): 0.5 mg via INTRAVENOUS
  Filled 2019-11-03 (×4): qty 0.5

## 2019-11-03 MED ORDER — MIDAZOLAM HCL 2 MG/2ML IJ SOLN
INTRAMUSCULAR | Status: DC | PRN
  Administered 2019-11-03: 2 mg via INTRAVENOUS

## 2019-11-03 MED ORDER — 0.9 % SODIUM CHLORIDE (POUR BTL) OPTIME
TOPICAL | Status: DC | PRN
  Administered 2019-11-03: 1000 mL

## 2019-11-03 MED ORDER — FENTANYL CITRATE (PF) 250 MCG/5ML IJ SOLN
INTRAMUSCULAR | Status: AC
  Filled 2019-11-03: qty 5

## 2019-11-03 MED ORDER — ONDANSETRON 8 MG PO TBDP
8.0000 mg | ORAL_TABLET | Freq: Three times a day (TID) | ORAL | 0 refills | Status: DC | PRN
Start: 2019-11-03 — End: 2019-11-11

## 2019-11-03 MED ORDER — CEFAZOLIN SODIUM-DEXTROSE 2-4 GM/100ML-% IV SOLN
2.0000 g | INTRAVENOUS | Status: AC
  Administered 2019-11-03: 2 g via INTRAVENOUS

## 2019-11-03 MED ORDER — LACTATED RINGERS IV SOLN
INTRAVENOUS | Status: DC | PRN
Start: 2019-11-03 — End: 2019-11-03

## 2019-11-03 MED ORDER — MEPERIDINE HCL 50 MG/ML IJ SOLN: 6.2500 mg | INTRAMUSCULAR | Status: DC | PRN

## 2019-11-03 MED ORDER — LACTATED RINGERS IV SOLN: Freq: Once | INTRAVENOUS | Status: AC

## 2019-11-03 MED ORDER — PROPOFOL 10 MG/ML IV BOLUS
INTRAVENOUS | Status: AC
  Filled 2019-11-03: qty 40

## 2019-11-03 MED ORDER — PROMETHAZINE HCL 25 MG/ML IJ SOLN: 6.2500 mg | INTRAMUSCULAR | Status: DC | PRN

## 2019-11-03 MED ORDER — ONDANSETRON HCL 4 MG/2ML IJ SOLN
INTRAMUSCULAR | Status: AC
  Filled 2019-11-03: qty 2

## 2019-11-03 MED ORDER — HYDROCODONE-ACETAMINOPHEN 5-325 MG PO TABS: 1.0000 | ORAL_TABLET | Freq: Four times a day (QID) | ORAL | 0 refills | Status: DC | PRN

## 2019-11-03 MED ORDER — DEXAMETHASONE SODIUM PHOSPHATE 4 MG/ML IJ SOLN
INTRAMUSCULAR | Status: DC | PRN
  Administered 2019-11-03: 5 mg via INTRAVENOUS

## 2019-11-03 MED ORDER — ONDANSETRON HCL 4 MG/2ML IJ SOLN
INTRAMUSCULAR | Status: DC | PRN
  Administered 2019-11-03: 4 mg via INTRAVENOUS

## 2019-11-03 MED ORDER — MIDAZOLAM HCL 2 MG/2ML IJ SOLN
2.0000 mg | Freq: Once | INTRAMUSCULAR | Status: AC
  Administered 2019-11-03: 2 mg via INTRAVENOUS
  Filled 2019-11-03: qty 2

## 2019-11-03 MED ORDER — KETOROLAC TROMETHAMINE 30 MG/ML IJ SOLN
INTRAMUSCULAR | Status: AC
  Filled 2019-11-03: qty 1

## 2019-11-03 MED ORDER — PROPOFOL 10 MG/ML IV BOLUS
INTRAVENOUS | Status: DC | PRN
  Administered 2019-11-03: 200 mg via INTRAVENOUS

## 2019-11-03 MED ORDER — DEXAMETHASONE SODIUM PHOSPHATE 10 MG/ML IJ SOLN
INTRAMUSCULAR | Status: AC
  Filled 2019-11-03: qty 1

## 2019-11-03 SURGICAL SUPPLY — 30 items
BAG HAMPER (MISCELLANEOUS) ×3 IMPLANT
CLOTH BEACON ORANGE TIMEOUT ST (SAFETY) ×3 IMPLANT
COVER LIGHT HANDLE STERIS (MISCELLANEOUS) ×6 IMPLANT
COVER WAND RF STERILE (DRAPES) ×3 IMPLANT
GAUZE 4X4 16PLY RFD (DISPOSABLE) ×6 IMPLANT
GLOVE BIO SURGEON STRL SZ7 (GLOVE) ×2 IMPLANT
GLOVE BIOGEL PI IND STRL 7.0 (GLOVE) ×2 IMPLANT
GLOVE BIOGEL PI IND STRL 8 (GLOVE) ×1 IMPLANT
GLOVE BIOGEL PI INDICATOR 7.0 (GLOVE) ×4
GLOVE BIOGEL PI INDICATOR 8 (GLOVE) ×2
GLOVE ECLIPSE 8.0 STRL XLNG CF (GLOVE) ×3 IMPLANT
GOWN STRL REUS W/TWL LRG LVL3 (GOWN DISPOSABLE) ×3 IMPLANT
GOWN STRL REUS W/TWL XL LVL3 (GOWN DISPOSABLE) ×3 IMPLANT
HANDPIECE ABLA MINERVA ENDO (MISCELLANEOUS) ×3 IMPLANT
INST SET HYSTEROSCOPY (KITS) ×3 IMPLANT
IV NS IRRIG 3000ML ARTHROMATIC (IV SOLUTION) ×2 IMPLANT
KIT TURNOVER CYSTO (KITS) ×3 IMPLANT
MANIFOLD NEPTUNE II (INSTRUMENTS) ×3 IMPLANT
MARKER SKIN DUAL TIP RULER LAB (MISCELLANEOUS) ×3 IMPLANT
NS IRRIG 1000ML POUR BTL (IV SOLUTION) ×3 IMPLANT
PACK BASIC III (CUSTOM PROCEDURE TRAY) ×3
PACK SRG BSC III STRL LF ECLPS (CUSTOM PROCEDURE TRAY) ×1 IMPLANT
PAD ARMBOARD 7.5X6 YLW CONV (MISCELLANEOUS) ×3 IMPLANT
PAD TELFA 3X4 1S STER (GAUZE/BANDAGES/DRESSINGS) ×3 IMPLANT
SET BASIN LINEN APH (SET/KITS/TRAYS/PACK) ×3 IMPLANT
SET IRRIG Y TYPE TUR BLADDER L (SET/KITS/TRAYS/PACK) ×3 IMPLANT
SHEET LAVH (DRAPES) ×3 IMPLANT
SUT VIC AB 0 CT1 27 (SUTURE) ×3
SUT VIC AB 0 CT1 27XBRD ANTBC (SUTURE) IMPLANT
YANKAUER SUCT BULB TIP 10FT TU (MISCELLANEOUS) ×3 IMPLANT

## 2019-11-03 NOTE — H&P (Signed)
Preoperative History and Physical  Caroline Brooks is a 29 y.o. [phone] with Patient's last menstrual period was 10/18/2019. admitted for a hysteroscopy uterine curettage Minerva endometrial ablation.  Pt with long history of ITP menometrorrhaiga chronically as well  No dyspareunia Sonogram is normal anatomy  PMH:    Past Medical History:  Diagnosis Date  . Alcohol abuse   . Maternal alloimmune thrombocytopenia complicating pregnancy   . Narcotic abuse (HCC)   . Thrombocytopenia (HCC)   . Thrombocytopenia (HCC) 07/07/2019   07/06/19 platelets 38 ck platelet profile    PSH:     Past Surgical History:  Procedure Laterality Date  . BILATERAL SALPINGECTOMY Bilateral 11/18/2014   Procedure: BILATERAL SALPINGECTOMY;  Surgeon: Lazaro Arms, MD;  Location: [facility];  Service: Obstetrics;  Laterality: Bilateral;  . CESAREAN SECTION  2011  . CESAREAN SECTION  11/26/2011   Procedure: CESAREAN SECTION;  Surgeon: Lazaro Arms, MD;  Location: [facility];  Service: Gynecology;  Laterality: N/A;  . CESAREAN SECTION N/A 11/18/2014   Procedure: CESAREAN SECTION;  Surgeon: Lazaro Arms, MD;  Location: [facility];  Service: Obstetrics;  Laterality: N/A;    POb/GynH:      OB History    Gravida  3   Para  3   Term  3   Preterm  0   AB  0   Living  3     SAB  0   TAB  0   Ectopic  0   Multiple  0   Live Births  2           SH:   Social History   Tobacco Use  . Smoking status: Current Some Day Smoker    Packs/day: 0.25    Years: 5.00    Pack years: 1.25    Types: Cigarettes  . Smokeless tobacco: Never Used  Substance Use Topics  . Alcohol use: Not Currently  . Drug use: No    FH:    Family History  Problem Relation Age of Onset  . Cancer Mother 59       breast   . Hypertension Father   . Diabetes Father   . Other Brother        Alpha 1  . Cancer Maternal Grandfather        prostate  . Hypertension Maternal Grandfather   . Early death Paternal Grandfather   .  Alcohol abuse Paternal Grandfather   . Healthy Daughter   . Anemia Maternal Grandmother   . Other Daughter        pulmonary stenosis  . Healthy Son      Allergies: No Known Allergies  Medications:       Current Facility-Administered Medications:  .  0.9 % irrigation (POUR BTL), , , PRN, Lazaro Arms, MD, 1,000 mL at 11/03/19 1133 .  ceFAZolin (ANCEF) IVPB 2g/100 mL premix, 2 g, Intravenous, On Call to OR, Lazaro Arms, MD .  ketorolac (TORADOL) 30 MG/ML injection 30 mg, 30 mg, Intravenous, Once, Lazaro Arms, MD  Review of Systems:   Review of Systems  Constitutional: Negative for fever, chills, weight loss, malaise/fatigue and diaphoresis.  HENT: Negative for hearing loss, ear pain, nosebleeds, congestion, sore throat, neck pain, tinnitus and ear discharge.   Eyes: Negative for blurred vision, double vision, photophobia, pain, discharge and redness.  Respiratory: Negative for cough, hemoptysis, sputum production, shortness of breath, wheezing and stridor.   Cardiovascular: Negative for chest pain, palpitations, orthopnea,  claudication, leg swelling and PND.  Gastrointestinal: Positive for abdominal pain. Negative for heartburn, nausea, vomiting, diarrhea, constipation, blood in stool and melena.  Genitourinary: Negative for dysuria, urgency, frequency, hematuria and flank pain.  Musculoskeletal: Negative for myalgias, back pain, joint pain and falls.  Skin: Negative for itching and rash.  Neurological: Negative for dizziness, tingling, tremors, sensory change, speech change, focal weakness, seizures, loss of consciousness, weakness and headaches.  Endo/Heme/Allergies: Negative for environmental allergies and polydipsia. Does not bruise/bleed easily.  Psychiatric/Behavioral: Negative for depression, suicidal ideas, hallucinations, memory loss and substance abuse. The patient is not nervous/anxious and does not have insomnia.      PHYSICAL EXAM:  Blood pressure 126/79, pulse  85, temperature 98.3 F (36.8 C), temperature source Oral, resp. rate 18, last menstrual period 10/18/2019, SpO2 100 %.    Vitals reviewed. Constitutional: She is oriented to person, place, and time. She appears well-developed and well-nourished.  HENT:  Head: Normocephalic and atraumatic.  Right Ear: External ear normal.  Left Ear: External ear normal.  Nose: Nose normal.  Mouth/Throat: Oropharynx is clear and moist.  Eyes: Conjunctivae and EOM are normal. Pupils are equal, round, and reactive to light. Right eye exhibits no discharge. Left eye exhibits no discharge. No scleral icterus.  Neck: Normal range of motion. Neck supple. No tracheal deviation present. No thyromegaly present.  Cardiovascular: Normal rate, regular rhythm, normal heart sounds and intact distal pulses.  Exam reveals no gallop and no friction rub.   No murmur heard. Respiratory: Effort normal and breath sounds normal. No respiratory distress. She has no wheezes. She has no rales. She exhibits no tenderness.  GI: Soft. Bowel sounds are normal. She exhibits no distension and no mass. There is tenderness. There is no rebound and no guarding.  Genitourinary:       Vulva is normal without lesions Vagina is pink moist without discharge Cervix normal in appearance and pap is normal Uterus is normal size, contour, position, consistency, mobility, non-tender Adnexa is negative with normal sized ovaries by sonogram  Musculoskeletal: Normal range of motion. She exhibits no edema and no tenderness.  Neurological: She is alert and oriented to person, place, and time. She has normal reflexes. She displays normal reflexes. No cranial nerve deficit. She exhibits normal muscle tone. Coordination normal.  Skin: Skin is warm and dry. No rash noted. No erythema. No pallor.  Psychiatric: She has a normal mood and affect. Her behavior is normal. Judgment and thought content normal.    Labs: Results for orders placed or performed during  the hospital encounter of 11/01/19 (from the past 336 hour(s))  CBC   Collection Time: 11/01/19  2:05 PM  Result Value Ref Range   WBC 6.1 4.0 - 10.5 K/uL   RBC 4.47 3.87 - 5.11 MIL/uL   Hemoglobin 13.4 12.0 - 15.0 g/dL   HCT 21.3 08.6 - 57.8 %   MCV 93.7 80.0 - 100.0 fL   MCH 30.0 26.0 - 34.0 pg   MCHC 32.0 30.0 - 36.0 g/dL   RDW 46.9 62.9 - 52.8 %   Platelets 37 (L) 150 - 400 K/uL   nRBC 0.0 0.0 - 0.2 %  Comprehensive metabolic panel   Collection Time: 11/01/19  2:05 PM  Result Value Ref Range   Sodium 133 (L) 135 - 145 mmol/L   Potassium 4.0 3.5 - 5.1 mmol/L   Chloride 101 98 - 111 mmol/L   CO2 24 22 - 32 mmol/L   Glucose, Bld 127 (H) 70 -  99 mg/dL   BUN 14 6 - 20 mg/dL   Creatinine, Ser 0.96 0.44 - 1.00 mg/dL   Calcium 9.2 8.9 - 28.3 mg/dL   Total Protein 7.2 6.5 - 8.1 g/dL   Albumin 4.6 3.5 - 5.0 g/dL   AST 18 15 - 41 U/L   ALT 19 0 - 44 U/L   Alkaline Phosphatase 63 38 - 126 U/L   Total Bilirubin 0.5 0.3 - 1.2 mg/dL   GFR calc non Af Amer >60 >60 mL/min   GFR calc Af Amer >60 >60 mL/min   Anion gap 8 5 - 15  hCG, quantitative, pregnancy   Collection Time: 11/01/19  2:05 PM  Result Value Ref Range   hCG, Beta Chain, Quant, S <1 <5 mIU/mL  Rapid HIV screen (HIV 1/2 Ab+Ag)   Collection Time: 11/01/19  2:05 PM  Result Value Ref Range   HIV-1 P24 Antigen - HIV24 NON REACTIVE NON REACTIVE   HIV 1/2 Antibodies NON REACTIVE NON REACTIVE   Interpretation (HIV Ag Ab)      A non reactive test result means that HIV 1 or HIV 2 antibodies and HIV 1 p24 antigen were not detected in the specimen.  Urinalysis, Routine w reflex microscopic   Collection Time: 11/01/19  2:05 PM  Result Value Ref Range   Color, Urine AMBER (A) YELLOW   APPearance CLOUDY (A) CLEAR   Specific Gravity, Urine 1.040 (H) 1.005 - 1.030   pH 5.0 5.0 - 8.0   Glucose, UA NEGATIVE NEGATIVE mg/dL   Hgb urine dipstick NEGATIVE NEGATIVE   Bilirubin Urine NEGATIVE NEGATIVE   Ketones, ur 5 (A) NEGATIVE mg/dL    Protein, ur NEGATIVE NEGATIVE mg/dL   Nitrite NEGATIVE NEGATIVE   Leukocytes,Ua NEGATIVE NEGATIVE  Results for orders placed or performed during the hospital encounter of 11/01/19 (from the past 336 hour(s))  SARS CORONAVIRUS 2 (TAT 6-24 HRS) Nasopharyngeal Nasopharyngeal Swab   Collection Time: 11/01/19  1:45 PM   Specimen: Nasopharyngeal Swab  Result Value Ref Range   SARS Coronavirus 2 NEGATIVE NEGATIVE    EKG: Orders placed or performed during the hospital encounter of 03/16/16  . ED EKG  . ED EKG  . EKG 12-Lead  . EKG 12-Lead  . EKG    Imaging Studies: GYNECOLOGIC SONOGRAM   Caroline Brooks is a 29 y.o. [id number] She is here for a pelvic sonogram for menorrhagia.  Uterus                     6.7 x 4.2 x 5.7 cm, Total uterine volume 84 cc,homogeneous anteverted uterus,wnl  Endometrium         10.9 mm, symmetrical, wnl  Right ovary              3.4   x 1.7 x 2.9 cm, wnl                                                                              Left ovary                 3.1 x 3.8 x 2.2 cm, wnl  No free fluid   Technician Comments:  PELVIC  US TA/TV: homogeneous anteverted uterus,wnl,EEC 10.9 mm,normal ovaries,ovaries appear mobile,no pain during ultrasound,no free fluid     [employer] 09/20/2019 4:14 PM  Clinical Impression and recommendations:  I have reviewed the sonogram results above, combined with the patient's current clinical course, below are my impressions and any appropriate recommendations for management based on the sonographic findings.  Uterus is normal size shape and contour Endometrium is normal Both ovaries are normal  No anatomic etiology for patient's heavy menses   Florian Buff 09/21/2019 11:18 AM    Assessment: ITP Menorrhagia Dysmenorrhea   Patient Active Problem List   Diagnosis Date Noted  . Screening examination for STD (sexually transmitted disease) 08/23/2019  . Menorrhagia with irregular  cycle 08/23/2019  . Thrombocytopenia ([place]) 07/07/2019  . Irregular periods 07/05/2019  . History of thrombocytopenia 07/05/2019  . Pregnancy examination or test, negative result 07/05/2019  . BV (bacterial vaginosis) 07/05/2019  . Vaginal discharge 07/05/2019  . Thrombocytopenia affecting pregnancy, antepartum ([place]) 11/18/2014  . Elevated BP 11/09/2014  . Hx of preeclampsia, prior pregnancy, currently pregnant 10/19/2014  . Previous cesarean delivery, antepartum 10/19/2014  . Depression affecting pregnancy, antepartum 10/19/2014  . Marijuana use 10/19/2014  . Supervision of normal pregnancy 04/28/2014    Plan: Hysteroscopy uterine curettage Minerva endometrial ablation  Florian Buff 11/03/2019 11:39 AM

## 2019-11-03 NOTE — Anesthesia Preprocedure Evaluation (Signed)
Anesthesia Evaluation  Patient identified by MRN, date of birth, ID band Patient awake    Reviewed: Allergy & Precautions, NPO status , Patient's Chart, lab work & pertinent test results  Airway Mallampati: II  TM Distance: >3 FB Neck ROM: Full    Dental  (+) Teeth Intact, Dental Advisory Given, Missing, Chipped   Pulmonary Current Smoker and Patient abstained from smoking.,    Pulmonary exam normal breath sounds clear to auscultation       Cardiovascular Exercise Tolerance: Good Normal cardiovascular exam Rhythm:Regular Rate:Normal     Neuro/Psych PSYCHIATRIC DISORDERS Depression    GI/Hepatic negative GI ROS, (+)     substance abuse  marijuana use,   Endo/Other  negative endocrine ROS  Renal/GU negative Renal ROS  negative genitourinary   Musculoskeletal negative musculoskeletal ROS (+)   Abdominal   Peds negative pediatric ROS (+)  Hematology  (+) Blood dyscrasia (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,000), ,   Anesthesia Other Findings   Reproductive/Obstetrics negative OB ROS                             Anesthesia Physical Anesthesia Plan  ASA: III  Anesthesia Plan: General   Post-op Pain Management:    Induction: Intravenous  PONV Risk Score and Plan: 4 or greater and Midazolam, Ondansetron, Dexamethasone and Treatment may vary due to age or medical condition  Airway Management Planned: LMA  Additional Equipment:   Intra-op Plan:   Post-operative Plan: Extubation in OR  Informed Consent: I have reviewed the patients History and Physical, chart, labs and discussed the procedure including the risks, benefits and alternatives for the proposed anesthesia with the patient or authorized representative who has indicated his/her understanding and acceptance.     Dental advisory given  Plan Discussed with: CRNA and Surgeon  Anesthesia Plan Comments:         Anesthesia Quick  Evaluation

## 2019-11-03 NOTE — Transfer of Care (Signed)
Immediate Anesthesia Transfer of Care Note  Patient: Caroline Brooks  Procedure(s) Performed: DILATATION AND CURETTAGE/HYSTEROSCOPY WITH MINERVA (N/A )  Patient Location: PACU  Anesthesia Type:General  Level of Consciousness: awake, alert , oriented and patient cooperative  Airway & Oxygen Therapy: Patient Spontanous Breathing and Patient connected to face mask oxygen  Post-op Assessment: Report given to RN, Post -op Vital signs reviewed and stable and Patient moving all extremities X 4  Post vital signs: Reviewed and stable  Last Vitals:  Vitals Value Taken Time  BP    Temp    Pulse 133 11/03/19 1231  Resp    SpO2 99 % 11/03/19 1231  Vitals shown include unvalidated device data.  Last Pain:  Vitals:   11/03/19 0920  TempSrc: Oral  PainSc: 9       Patients Stated Pain Goal: 5 (11/03/19 0920)  Complications: No apparent anesthesia complications

## 2019-11-03 NOTE — Op Note (Signed)
Preoperative diagnosis:  1.   Menorrhagia                                         2.  Dysmenorrhea                                         3.  ITP, chronic   Postoperative diagnoses: Same as above   Procedure: Hysteroscopy,  endometrial ablation using Minerva  Surgeon: Lazaro Arms   Anesthesia: Laryngeal mask airway  Findings: The endometrium was normal. There were no fibroid or other abnormalities.  Description of operation: The patient was taken to the operating room and placed in the supine position. She underwent general anesthesia using the laryngeal mask airway. She was placed in the dorsal lithotomy position and prepped and draped in the usual sterile fashion. A Graves speculum was placed and the anterior cervical lip was grasped with a single-tooth tenaculum. The cervix was dilated serially to allow passage of the hysteroscope. Diagnostic hysteroscopy was performed and was found to be normal. With her history of ITP, a platelet count of 37K and a normal endometrium at age 29 I did not perform the hysteroscopy. I then proceeded to perform the Minerva endometrial ablation.   The uterus sounded to 9 cm The handpiece was attached to the Minerva power source/machine and the handpiece passed the checklist. The array was squeezed down to remove all of the air present.  The array was then place into the endometrial cavity and deployed to a length of 6 cm. The handpiece confirmed appropriate width by being in the green portion of the visual dial. The cervical cuff was then inflated to the point the CO2 indicator was in the green. The endometrial integrity check was then performed and integrity sequence was confirmed x 2. The heating was then begun and carried out for a total of 2 minutes(which is standard therapy time). When the plasma cycle was finished,  the cervical cuff was deflated and the array was removed with tissue present on the silicon membrane. There was appropriate post  Minerva bleeding and uterine discharge.     All of the equipment worked well throughout the procedure.  The patient was awakened from anesthesia and taken to the recovery room in good stable condition all counts were correct. She received 2 g of Ancef and 30 mg of Toradol preoperatively. She will be discharged from the recovery room and followed up in the office in 1- 2 weeks.   She can expect 4 weeks of post procedure bloody watery discharge  Lazaro Arms, MD  11/03/2019 12:25 PM

## 2019-11-03 NOTE — Anesthesia Postprocedure Evaluation (Signed)
Anesthesia Post Note  Patient: Caroline Brooks  Procedure(s) Performed: DILATATION AND CURETTAGE/HYSTEROSCOPY WITH MINERVA (N/A )  Patient location during evaluation: PACU Anesthesia Type: General Level of consciousness: awake, oriented, awake and alert and patient cooperative Pain management: pain level controlled Vital Signs Assessment: post-procedure vital signs reviewed and stable Respiratory status: spontaneous breathing, nonlabored ventilation and patient connected to face mask oxygen Cardiovascular status: stable Postop Assessment: no apparent nausea or vomiting Anesthetic complications: no     Last Vitals:  Vitals:   11/03/19 0920  BP: 126/79  Pulse: 85  Resp: 18  Temp: 36.8 C  SpO2: 100%    Last Pain:  Vitals:   11/03/19 0920  TempSrc: Oral  PainSc: 9                  Alexya Mcdaris

## 2019-11-03 NOTE — Discharge Instructions (Signed)
Endometrial Ablation  Endometrial ablation is a procedure that destroys the thin inner layer of the lining of the uterus (endometrium). This procedure may be done:  To stop heavy periods.  To stop bleeding that is causing anemia.  To control irregular bleeding.  To treat bleeding caused by small tumors (fibroids) in the endometrium.  This procedure is often an alternative to major surgery, such as removal of the uterus and cervix (hysterectomy). As a result of this procedure:  You may not be able to have children. However, if you are premenopausal (you have not gone through menopause):  You may still have a small chance of getting pregnant.  You will need to use a reliable method of birth control after the procedure to prevent pregnancy.  You may stop having a menstrual period, or you may have only a small amount of bleeding during your period. Menstruation may return several years after the procedure.  Tell a health care provider about:  Any allergies you have.  All medicines you are taking, including vitamins, herbs, eye drops, creams, and over-the-counter medicines.  Any problems you or family members have had with the use of anesthetic medicines.  Any blood disorders you have.  Any surgeries you have had.  Any medical conditions you have.  What are the risks?  Generally, this is a safe procedure. However, problems may occur, including:  A hole (perforation) in the uterus or bowel.  Infection of the uterus, bladder, or vagina.  Bleeding.  Damage to other structures or organs.  An air bubble in the lung (air embolus).  Problems with pregnancy after the procedure.  Failure of the procedure.  Decreased ability to diagnose cancer in the endometrium.  What happens before the procedure?  You will have tests of your endometrium to make sure there are no pre-cancerous cells or cancer cells present.  You may have an ultrasound of the uterus.  You may be given medicines to thin the endometrium.  Ask your health care  provider about:  Changing or stopping your regular medicines. This is especially important if you take diabetes medicines or blood thinners.  Taking medicines such as aspirin and ibuprofen. These medicines can thin your blood. Do not take these medicines before your procedure if your doctor tells you not to.  Plan to have someone take you home from the hospital or clinic.  What happens during the procedure?    You will lie on an exam table with your feet and legs supported as in a pelvic exam.  To lower your risk of infection:  Your health care team will wash or sanitize their hands and put on germ-free (sterile) gloves.  Your genital area will be washed with soap.  An IV tube will be inserted into one of your veins.  You will be given a medicine to help you relax (sedative).  A surgical instrument with a light and camera (resectoscope) will be inserted into your vagina and moved into your uterus. This allows your surgeon to see inside your uterus.  Endometrial tissue will be removed using one of the following methods:  Radiofrequency. This method uses a radiofrequency-alternating electric current to remove the endometrium.  Cryotherapy. This method uses extreme cold to freeze the endometrium.  Heated-free liquid. This method uses a heated saltwater (saline) solution to remove the endometrium.  Microwave. This method uses high-energy microwaves to heat up the endometrium and remove it.  Thermal balloon. This method involves inserting a catheter with a balloon tip into   the uterus. The balloon tip is filled with heated fluid to remove the endometrium.  The procedure may vary among health care providers and hospitals.  What happens after the procedure?  Your blood pressure, heart rate, breathing rate, and blood oxygen level will be monitored until the medicines you were given have worn off.  As tissue healing occurs, you may notice vaginal bleeding for 4-6 weeks after the procedure. You may also  experience:  Cramps.  Thin, watery vaginal discharge that is light pink or brown in color.  A need to urinate more frequently than usual.  Nausea.  Do not drive for 24 hours if you were given a sedative.  Do not have sex or insert anything into your vagina until your health care provider approves.  Summary  Endometrial ablation is done to treat the many causes of heavy menstrual bleeding.  The procedure may be done only after medications have been tried to control the bleeding.  Plan to have someone take you home from the hospital or clinic.  This information is not intended to replace advice given to you by your health care provider. Make sure you discuss any questions you have with your health care provider.  Document Revised: 11/25/2017 Document Reviewed: 06/27/2016  Elsevier Patient Education  2020 [employer]

## 2019-11-03 NOTE — Anesthesia Procedure Notes (Signed)
Procedure Name: LMA Insertion Performed by: Earmon Phoenix, CRNA Pre-anesthesia Checklist: Patient identified, Emergency Drugs available, Suction available, Patient being monitored and Timeout performed Patient Re-evaluated:Patient Re-evaluated prior to induction Oxygen Delivery Method: Circle system utilized Preoxygenation: Pre-oxygenation with 100% oxygen Induction Type: IV induction LMA: LMA inserted LMA Size: 4.0 Number of attempts: 1 Tube secured with: Tape Dental Injury: Teeth and Oropharynx as per pre-operative assessment

## 2019-11-10 ENCOUNTER — Telehealth: Payer: Self-pay | Admitting: Obstetrics & Gynecology

## 2019-11-10 NOTE — Telephone Encounter (Signed)

## 2019-11-11 ENCOUNTER — Encounter: Payer: Self-pay | Admitting: Obstetrics & Gynecology

## 2019-11-11 ENCOUNTER — Ambulatory Visit (INDEPENDENT_AMBULATORY_CARE_PROVIDER_SITE_OTHER): Payer: Medicaid Other | Admitting: Obstetrics & Gynecology

## 2019-11-11 VITALS — BP 107/62 | HR 82 | Ht 68.5 in | Wt 164.0 lb

## 2019-11-11 DIAGNOSIS — Z9889 Other specified postprocedural states: Secondary | ICD-10-CM

## 2019-11-11 NOTE — Progress Notes (Signed)
  HPI: Patient returns for routine postoperative follow-up having undergone hysteroscopy Minerva ablation on 11/03/19.  The patient's immediate postoperative recovery has been unremarkable. Since hospital discharge the patient reports no problems.   Current Outpatient Medications: .  cyclobenzaprine (FLEXERIL) 10 MG tablet, Take 1 tablet (10 mg total) by mouth every 8 (eight) hours as needed for muscle spasms., Disp: 30 tablet, Rfl: 1 .  valACYclovir (VALTREX) 1000 MG tablet, Take 2 at time feels cold sore and then 2 more in 24 hours, Disp: 30 tablet, Rfl: 1  No current facility-administered medications for this visit.    Blood pressure 107/62, pulse 82, height 5' 8.5" (1.74 m), weight 164 lb (74.4 kg), last menstrual period 10/18/2019.  Physical Exam: Normal post ablation exam  Diagnostic Tests:   Pathology: benign  Impression: Normal post ablation exam/course  Plan:   Follow up: prn    Lazaro Arms, MD

## 2019-11-26 ENCOUNTER — Other Ambulatory Visit: Payer: Self-pay

## 2019-11-26 ENCOUNTER — Inpatient Hospital Stay (HOSPITAL_BASED_OUTPATIENT_CLINIC_OR_DEPARTMENT_OTHER): Payer: Medicaid Other | Admitting: Nurse Practitioner

## 2019-11-26 ENCOUNTER — Inpatient Hospital Stay (HOSPITAL_COMMUNITY): Payer: Medicaid Other | Attending: Hematology

## 2019-11-26 VITALS — BP 113/64 | HR 77 | Temp 96.9°F | Resp 18 | Wt 161.8 lb

## 2019-11-26 DIAGNOSIS — Z79899 Other long term (current) drug therapy: Secondary | ICD-10-CM | POA: Diagnosis not present

## 2019-11-26 DIAGNOSIS — Z811 Family history of alcohol abuse and dependence: Secondary | ICD-10-CM | POA: Diagnosis not present

## 2019-11-26 DIAGNOSIS — Z832 Family history of diseases of the blood and blood-forming organs and certain disorders involving the immune mechanism: Secondary | ICD-10-CM | POA: Insufficient documentation

## 2019-11-26 DIAGNOSIS — D696 Thrombocytopenia, unspecified: Secondary | ICD-10-CM

## 2019-11-26 DIAGNOSIS — Z9079 Acquired absence of other genital organ(s): Secondary | ICD-10-CM | POA: Insufficient documentation

## 2019-11-26 DIAGNOSIS — Z836 Family history of other diseases of the respiratory system: Secondary | ICD-10-CM | POA: Diagnosis not present

## 2019-11-26 DIAGNOSIS — Z8249 Family history of ischemic heart disease and other diseases of the circulatory system: Secondary | ICD-10-CM | POA: Insufficient documentation

## 2019-11-26 DIAGNOSIS — Z8042 Family history of malignant neoplasm of prostate: Secondary | ICD-10-CM | POA: Diagnosis not present

## 2019-11-26 DIAGNOSIS — Z833 Family history of diabetes mellitus: Secondary | ICD-10-CM | POA: Diagnosis not present

## 2019-11-26 DIAGNOSIS — F17210 Nicotine dependence, cigarettes, uncomplicated: Secondary | ICD-10-CM | POA: Diagnosis not present

## 2019-11-26 DIAGNOSIS — R42 Dizziness and giddiness: Secondary | ICD-10-CM | POA: Diagnosis not present

## 2019-11-26 DIAGNOSIS — D693 Immune thrombocytopenic purpura: Secondary | ICD-10-CM | POA: Insufficient documentation

## 2019-11-26 DIAGNOSIS — R5383 Other fatigue: Secondary | ICD-10-CM | POA: Insufficient documentation

## 2019-11-26 DIAGNOSIS — Z803 Family history of malignant neoplasm of breast: Secondary | ICD-10-CM | POA: Insufficient documentation

## 2019-11-26 LAB — CBC WITH DIFFERENTIAL/PLATELET
Abs Immature Granulocytes: 0 10*3/uL (ref 0.00–0.07)
Basophils Absolute: 0 10*3/uL (ref 0.0–0.1)
Basophils Relative: 1 %
Eosinophils Absolute: 0 10*3/uL (ref 0.0–0.5)
Eosinophils Relative: 1 %
HCT: 45.4 % (ref 36.0–46.0)
Hemoglobin: 14.5 g/dL (ref 12.0–15.0)
Immature Granulocytes: 0 %
Lymphocytes Relative: 43 %
Lymphs Abs: 1.8 10*3/uL (ref 0.7–4.0)
MCH: 30 pg (ref 26.0–34.0)
MCHC: 31.9 g/dL (ref 30.0–36.0)
MCV: 93.8 fL (ref 80.0–100.0)
Monocytes Absolute: 0.5 10*3/uL (ref 0.1–1.0)
Monocytes Relative: 11 %
Neutro Abs: 1.8 10*3/uL (ref 1.7–7.7)
Neutrophils Relative %: 44 %
Platelets: 21 10*3/uL — CL (ref 150–400)
RBC: 4.84 MIL/uL (ref 3.87–5.11)
RDW: 13.5 % (ref 11.5–15.5)
WBC: 4.2 10*3/uL (ref 4.0–10.5)
nRBC: 0 % (ref 0.0–0.2)

## 2019-11-26 LAB — COMPREHENSIVE METABOLIC PANEL
ALT: 17 U/L (ref 0–44)
AST: 17 U/L (ref 15–41)
Albumin: 4.3 g/dL (ref 3.5–5.0)
Alkaline Phosphatase: 65 U/L (ref 38–126)
Anion gap: 8 (ref 5–15)
BUN: 7 mg/dL (ref 6–20)
CO2: 28 mmol/L (ref 22–32)
Calcium: 9.2 mg/dL (ref 8.9–10.3)
Chloride: 102 mmol/L (ref 98–111)
Creatinine, Ser: 0.78 mg/dL (ref 0.44–1.00)
GFR calc Af Amer: >60 mL/min (ref >60)
GFR calc non Af Amer: >60 mL/min (ref >60)
Glucose, Bld: 112 mg/dL — ABNORMAL HIGH (ref 70–99)
Potassium: 4.9 mmol/L (ref 3.5–5.1)
Sodium: 138 mmol/L (ref 135–145)
Total Bilirubin: 0.5 mg/dL (ref 0.3–1.2)
Total Protein: 7.1 g/dL (ref 6.5–8.1)

## 2019-11-26 LAB — LACTATE DEHYDROGENASE: LDH: 120 U/L (ref 98–192)

## 2019-11-26 MED ORDER — DEXAMETHASONE 20 MG PO TABS: 40.0000 mg | ORAL_TABLET | Freq: Every day | ORAL | 0 refills | Status: DC

## 2019-11-26 NOTE — Assessment & Plan Note (Signed)
1.  ITP: -She has a history of thrombocytopenia dating back to 2016 when her platelet count ranged between 40 and 70. -She reportedly had thrombocytopenia during her pregnancy also. -She reported having menses lasting 2-1/2 days every 2 weeks for the past few years.  No increase in the bleeding. -She also reports very easy bruising particular in the upper thighs for the past 10 years and stable. 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  She reports that she has been clean for 3 years. -An ultrasound of her abdomen on 04/05/2016 showed normal-sized spleen.  No other pathology seen. -CBC on 07/05/2019 showed platelet count of 38. -Nutritional deficiency work-up was negative.  Hepatitis C antibody was positive.  We checked a hepatitis C RNA PCR which was negative. -Patient reports she had an ablation of her uterus to control her periods.  She reports she only has very light dark bleeding now. -If she does need platelet count more than 50 for prior procedure we will treat give a trial of dexamethasone 40 mg x 4 days. -Labs done on 11/26/2019 showed platelet count of 21 -We will try a dose of dexamethasone 40 mg for 4 days. -We will see her back in 2 weeks with repeat labs.

## 2019-11-26 NOTE — Progress Notes (Signed)
[address] [place], [place] [id number]   CLINIC:  Medical Oncology/Hematology  PCP:  [facility], [facility] [place]  [address] [phone]   REASON FOR VISIT: Follow-up for ITP   CURRENT THERAPY: Observation   INTERVAL HISTORY:  Caroline Brooks 29 y.o. female returns for routine follow-up for ITP.  Patient reports she feels very fatigued.  She reports that she can feel and her platelets are low.  She reports it is directly related to her stress.  She reports she is trying to cut out a lot of stress in her life. Denies any nausea, vomiting, or diarrhea. Denies any new pains. Had not noticed any recent bleeding such as epistaxis, hematuria or hematochezia. Denies recent chest pain on exertion, shortness of breath on minimal exertion, pre-syncopal episodes, or palpitations. Denies any numbness or tingling in hands or feet. Denies any recent fevers, infections, or recent hospitalizations. Patient reports appetite at 100% and energy level at 0%.  She is eating well maintain her weight at this time.     REVIEW OF SYSTEMS:  Review of Systems  Constitutional: Positive for fatigue.  Neurological: Positive for dizziness.  All other systems reviewed and are negative.    PAST MEDICAL/SURGICAL HISTORY:  Past Medical History:  Diagnosis Date   Alcohol abuse    Maternal alloimmune thrombocytopenia complicating pregnancy    Narcotic abuse (HCC)    Thrombocytopenia (HCC)    Thrombocytopenia (HCC) 07/07/2019   07/06/19 platelets 38 ck platelet profile   Past Surgical History:  Procedure Laterality Date   BILATERAL SALPINGECTOMY Bilateral 11/18/2014   Procedure: BILATERAL SALPINGECTOMY;  Surgeon: Florian Buff, MD;  Location: [facility];  Service: Obstetrics;  Laterality: Bilateral;   CESAREAN SECTION  2011   CESAREAN SECTION  11/26/2011   Procedure: CESAREAN SECTION;  Surgeon: Florian Buff, MD;  Location: [facility];  Service:  Gynecology;  Laterality: N/A;   CESAREAN SECTION N/A 11/18/2014   Procedure: CESAREAN SECTION;  Surgeon: Florian Buff, MD;  Location: [facility];  Service: Obstetrics;  Laterality: N/A;   DILATATION AND CURETTAGE/HYSTEROSCOPY WITH MINERVA N/A 11/03/2019   Procedure: DILATATION AND CURETTAGE/HYSTEROSCOPY WITH MINERVA;  Surgeon: Florian Buff, MD;  Location: [facility];  Service: Gynecology;  Laterality: N/A;     SOCIAL HISTORY:  Social History   Socioeconomic History   Marital status: Married    Spouse name: Not on file   Number of children: 3   Years of education: Not on file   Highest education level: Not on file  Occupational History   Occupation: umemployed  Tobacco Use   Smoking status: Current Some Day Smoker    Packs/day: 0.25    Years: 5.00    Pack years: 1.25    Types: Cigarettes   Smokeless tobacco: Never Used  Substance and Sexual Activity   Alcohol use: Not Currently   Drug use: No   Sexual activity: Not Currently    Birth control/protection: Surgical  Other Topics Concern   Not on file  Social History Narrative   Not on file   Social Determinants of Health   Financial Resource Strain:    Difficulty of Paying Living Expenses:   Food Insecurity:    Worried About Charity fundraiser in the Last Year:    Arboriculturist in the Last Year:   Transportation Needs:    Film/video editor (Medical):    Lack of Transportation (Non-Medical):   Physical  Activity:    Days of Exercise per Week:    Minutes of Exercise per Session:   Stress:    Feeling of Stress :   Social Connections:    Frequency of Communication with Friends and Family:    Frequency of Social Gatherings with Friends and Family:    Attends Religious Services:    Active Member of Clubs or Organizations:    Attends Engineer, structural:    Marital Status:   Intimate Partner Violence:    Fear of Current or Ex-Partner:    Emotionally Abused:    Physically  Abused:    Sexually Abused:     FAMILY HISTORY:  Family History  Problem Relation Age of Onset   Cancer Mother 30       breast    Hypertension Father    Diabetes Father    Other Brother        Print production planner 1   Cancer Maternal Grandfather        prostate   Hypertension Maternal Grandfather    Early death Paternal Grandfather    Alcohol abuse Paternal Grandfather    Healthy Daughter    Anemia Maternal Grandmother    Other Daughter        pulmonary stenosis   Healthy Son     CURRENT MEDICATIONS:  Outpatient Encounter Medications as of 11/26/2019  Medication Sig   cyclobenzaprine (FLEXERIL) 10 MG tablet Take 1 tablet (10 mg total) by mouth every 8 (eight) hours as needed for muscle spasms. (Patient not taking: Reported on 11/26/2019)   dexamethasone 20 MG TABS Take 40 mg by mouth daily for 4 days.   valACYclovir (VALTREX) 1000 MG tablet Take 2 at time feels cold sore and then 2 more in 24 hours (Patient not taking: Reported on 11/26/2019)   No facility-administered encounter medications on file as of 11/26/2019.    ALLERGIES:  No Known Allergies   PHYSICAL EXAM:  ECOG Performance status: 1  Vitals:   11/26/19 0941  BP: 113/64  Pulse: 77  Resp: 18  Temp: (!) 96.9 F (36.1 C)  SpO2: 100%   Filed Weights   11/26/19 0941  Weight: 161 lb 12.8 oz (73.4 kg)   Physical Exam Constitutional:      Appearance: Normal appearance. She is normal weight.  Cardiovascular:     Rate and Rhythm: Normal rate and regular rhythm.     Heart sounds: Normal heart sounds.  Pulmonary:     Effort: Pulmonary effort is normal.     Breath sounds: Normal breath sounds.  Abdominal:     General: Bowel sounds are normal.     Palpations: Abdomen is soft.  Musculoskeletal:        General: Normal range of motion.  Skin:    General: Skin is warm.  Neurological:     Mental Status: She is alert and oriented to person, place, and time. Mental status is at baseline.  Psychiatric:         Mood and Affect: Mood normal.        Behavior: Behavior normal.        Thought Content: Thought content normal.        Judgment: Judgment normal.      LABORATORY DATA:  I have reviewed the labs as listed.  CBC    Component Value Date/Time   WBC 4.2 11/26/2019 0901   RBC 4.84 11/26/2019 0901   HGB 14.5 11/26/2019 0901   HGB 14.2 08/23/2019 1117   HCT  45.4 11/26/2019 0901   HCT 41.5 08/23/2019 1117   PLT 21 (LL) 11/26/2019 0901   PLT 49 (LL) 08/23/2019 1117   MCV 93.8 11/26/2019 0901   MCV 89 08/23/2019 1117   MCH 30.0 11/26/2019 0901   MCHC 31.9 11/26/2019 0901   RDW 13.5 11/26/2019 0901   RDW 12.4 08/23/2019 1117   LYMPHSABS 1.8 11/26/2019 0901   LYMPHSABS 2.9 07/05/2019 1226   MONOABS 0.5 11/26/2019 0901   EOSABS 0.0 11/26/2019 0901   EOSABS 0.0 07/05/2019 1226   BASOSABS 0.0 11/26/2019 0901   BASOSABS 0.1 07/05/2019 1226   CMP Latest Ref Rng & Units 11/26/2019 11/01/2019 10/15/2019  Glucose 70 - 99 mg/dL 580(D) 983(J) 825(K)  BUN 6 - 20 mg/dL 7 14 12   Creatinine 0.44 - 1.00 mg/dL 5.39 7.67  Sodium 135 - 145 mmol/L 138 133(L) 139  Potassium 3.5 - 5.1 mmol/L 4.9 4.0 4.9  Chloride 98 - 111 mmol/L 102 101 104  CO2 22 - 32 mmol/L 28 24 27   Calcium 8.9 - 10.3 mg/dL 9.2 9.2 9.4  Total Protein 6.5 - 8.1 g/dL 7.1 7.2 7.5  Total Bilirubin 0.3 - 1.2 mg/dL 0.5 0.5 0.4  Alkaline Phos 38 - 126 U/L 65 63 63  AST 15 - 41 U/L 17 18 15   ALT 0 - 44 U/L 17 19 13     All questions were answered to patient's stated satisfaction. Encouraged patient to call with any new concerns or questions before his next visit to the cancer center and we can certain see him sooner, if needed.     ASSESSMENT & PLAN:  Thrombocytopenia (HCC) 1.  ITP: -She has a history of thrombocytopenia dating back to 2016 when her platelet count ranged between 40 and 70. -She reportedly had thrombocytopenia during her pregnancy also. -She reported having menses lasting 2-1/2 days every 2 weeks for the past few  years.  No increase in the bleeding. -She also reports very easy bruising particular in the upper thighs for the past 10 years and stable. 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  She reports that she has been clean for 3 years. -An ultrasound of her abdomen on 04/05/2016 showed normal-sized spleen.  No other pathology seen. -CBC on 07/05/2019 showed platelet count of 38. -Nutritional deficiency work-up was negative.  Hepatitis C antibody was positive.  We checked a hepatitis C RNA PCR which was negative. -Patient reports she had an ablation of her uterus to control her periods.  She reports she only has very light dark bleeding now. -If she does need platelet count more than 50 for prior procedure we will treat give a trial of dexamethasone 40 mg x 4 days. -Labs done on 11/26/2019 showed platelet count of 21 -We will try a dose of dexamethasone 40 mg for 4 days. -We will see her back in 2 weeks with repeat labs.     Orders placed this encounter:  Orders Placed This Encounter  Procedures   CBC with Differential/Platelet      2017, FNP-C [facility] [phone]

## 2019-11-26 NOTE — Addendum Note (Signed)
Addended by: Doree Barthel on: 11/26/2019 10:53 AM   Modules accepted: Orders

## 2019-11-29 ENCOUNTER — Other Ambulatory Visit (HOSPITAL_COMMUNITY): Payer: Self-pay | Admitting: *Deleted

## 2019-11-29 DIAGNOSIS — D696 Thrombocytopenia, unspecified: Secondary | ICD-10-CM

## 2019-11-29 MED ORDER — DEXAMETHASONE 4 MG PO TABS
ORAL_TABLET | ORAL | 0 refills | Status: DC
Start: 2019-11-29 — End: 2019-12-10

## 2019-12-07 ENCOUNTER — Other Ambulatory Visit (HOSPITAL_COMMUNITY): Payer: Self-pay | Admitting: *Deleted

## 2019-12-10 ENCOUNTER — Inpatient Hospital Stay (HOSPITAL_BASED_OUTPATIENT_CLINIC_OR_DEPARTMENT_OTHER): Payer: Medicaid Other | Admitting: Nurse Practitioner

## 2019-12-10 ENCOUNTER — Inpatient Hospital Stay (HOSPITAL_COMMUNITY): Payer: Medicaid Other

## 2019-12-10 ENCOUNTER — Other Ambulatory Visit: Payer: Self-pay

## 2019-12-10 DIAGNOSIS — D693 Immune thrombocytopenic purpura: Secondary | ICD-10-CM | POA: Diagnosis not present

## 2019-12-10 DIAGNOSIS — D696 Thrombocytopenia, unspecified: Secondary | ICD-10-CM | POA: Diagnosis not present

## 2019-12-10 LAB — CBC WITH DIFFERENTIAL/PLATELET
Abs Immature Granulocytes: 0.03 10*3/uL (ref 0.00–0.07)
Basophils Absolute: 0 10*3/uL (ref 0.0–0.1)
Basophils Relative: 0 %
Eosinophils Absolute: 0.1 10*3/uL (ref 0.0–0.5)
Eosinophils Relative: 1 %
HCT: 43.3 % (ref 36.0–46.0)
Hemoglobin: 13.8 g/dL (ref 12.0–15.0)
Immature Granulocytes: 0 %
Lymphocytes Relative: 35 %
Lymphs Abs: 2.8 10*3/uL (ref 0.7–4.0)
MCH: 30.1 pg (ref 26.0–34.0)
MCHC: 31.9 g/dL (ref 30.0–36.0)
MCV: 94.3 fL (ref 80.0–100.0)
Monocytes Absolute: 0.7 10*3/uL (ref 0.1–1.0)
Monocytes Relative: 9 %
Neutro Abs: 4.4 10*3/uL (ref 1.7–7.7)
Neutrophils Relative %: 55 %
Platelets: 112 10*3/uL — ABNORMAL LOW (ref 150–400)
RBC: 4.59 MIL/uL (ref 3.87–5.11)
RDW: 13.4 % (ref 11.5–15.5)
WBC: 8 10*3/uL (ref 4.0–10.5)
nRBC: 0 % (ref 0.0–0.2)

## 2019-12-10 NOTE — Assessment & Plan Note (Addendum)
1.  ITP: -She has a history of thrombocytopenia dating back to 2016 when her platelet count ranged between 40 and 70. -She reportedly had thrombocytopenia during her pregnancy also. -She reported having menses lasting 2-1/2 days every 2 weeks for the past few years.  No increase in the bleeding. -She also reports very easy bruising particular in the upper thighs for the past 10 years and stable. 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  She reports that she has been clean for 3 years. -An ultrasound of her abdomen on 04/05/2016 showed normal-sized spleen.  No other pathology seen. -CBC on 07/05/2019 showed platelet count of 38. -Nutritional deficiency work-up was negative.  Hepatitis C antibody was positive.  We checked a hepatitis C RNA PCR which was negative. -Patient reports she had an ablation of her uterus to control her periods.  She reports she only has very light dark bleeding now. -If she does need platelet count more than 50 for prior procedure we will treat give a trial of dexamethasone 40 mg x 4 days. -Labs done on 11/26/2019 showed platelet count of 21 -We will try a dose of dexamethasone 40 mg for 4 days. -Labs done on 12/10/2019 showed increase in platelet to 112. -We will see her back in 3 weeks with repeat labs.

## 2019-12-10 NOTE — Progress Notes (Signed)
[address] [place], [place] [address]   CLINIC:  Medical Oncology/Hematology  PCP:  Inc, [facility] [place] [address] [phone]   REASON FOR VISIT: Follow-up for thrombocytopenia   CURRENT THERAPY: Dexamethasone 40 mg x 4 days   INTERVAL HISTORY:  Caroline Brooks 29 y.o. female returns for routine follow-up for thrombocytopenia.  Patient reports she felt a lot better since her platelets being higher.  She reports she had mild anxiety from the steroids.  However they did not interrupt her sleep at all. Denies any nausea, vomiting, or diarrhea. Denies any new pains. Had not noticed any recent bleeding such as epistaxis, hematuria or hematochezia. Denies recent chest pain on exertion, shortness of breath on minimal exertion, pre-syncopal episodes, or palpitations. Denies any numbness or tingling in hands or feet. Denies any recent fevers, infections, or recent hospitalizations. Patient reports appetite at 100% and energy level at 100%.  She is eating well maintain her weight at this time.    REVIEW OF SYSTEMS:  Review of Systems  Neurological: Positive for dizziness.  All other systems reviewed and are negative.    PAST MEDICAL/SURGICAL HISTORY:  Past Medical History:  Diagnosis Date  . Alcohol abuse   . Maternal alloimmune thrombocytopenia complicating pregnancy   . Narcotic abuse ([place])   . Thrombocytopenia ([place])   . Thrombocytopenia ([facility]) 07/07/2019   07/06/19 platelets 38 ck platelet profile   Past Surgical History:  Procedure Laterality Date  . BILATERAL SALPINGECTOMY Bilateral 11/18/2014   Procedure: BILATERAL SALPINGECTOMY;  Surgeon: Florian Buff, MD;  Location: [facility];  Service: Obstetrics;  Laterality: Bilateral;  . CESAREAN SECTION  2011  . CESAREAN SECTION  11/26/2011   Procedure: CESAREAN SECTION;  Surgeon: Florian Buff, MD;  Location: [facility];  Service: Gynecology;  Laterality: N/A;  . CESAREAN  SECTION N/A 11/18/2014   Procedure: CESAREAN SECTION;  Surgeon: Florian Buff, MD;  Location: [facility];  Service: Obstetrics;  Laterality: N/A;  . DILATATION AND CURETTAGE/HYSTEROSCOPY WITH MINERVA N/A 11/03/2019   Procedure: DILATATION AND CURETTAGE/HYSTEROSCOPY WITH MINERVA;  Surgeon: Florian Buff, MD;  Location: [facility];  Service: Gynecology;  Laterality: N/A;     SOCIAL HISTORY:  Social History   Socioeconomic History  . Marital status: Married    Spouse name: Not on file  . Number of children: 3  . Years of education: Not on file  . Highest education level: Not on file  Occupational History  . Occupation: umemployed  Tobacco Use  . Smoking status: Current Some Day Smoker    Packs/day: 0.25    Years: 5.00    Pack years: 1.25    Types: Cigarettes  . Smokeless tobacco: Never Used  Vaping Use  . Vaping Use: Never used  Substance and Sexual Activity  . Alcohol use: Not Currently  . Drug use: No  . Sexual activity: Not Currently    Birth control/protection: Surgical  Other Topics Concern  . Not on file  Social History Narrative  . Not on file   Social Determinants of Health   Financial Resource Strain:   . Difficulty of Paying Living Expenses:   Food Insecurity:   . Worried About Charity fundraiser in the Last Year:   . Arboriculturist in the Last Year:   Transportation Needs:   . Film/video editor (Medical):   [name] Lack of Transportation (Non-Medical):   Physical Activity:   .  Days of Exercise per Week:   . Minutes of Exercise per Session:   Stress:   . Feeling of Stress :   Social Connections:   . Frequency of Communication with Friends and Family:   . Frequency of Social Gatherings with Friends and Family:   . Attends Religious Services:   . Active Member of Clubs or Organizations:   . Attends Banker Meetings:   [name] Marital Status:   Intimate Partner Violence:   . Fear of Current or Ex-Partner:   . Emotionally Abused:   [name] Physically Abused:    . Sexually Abused:     FAMILY HISTORY:  Family History  Problem Relation Age of Onset  . Cancer Mother 6       breast   . Hypertension Father   . Diabetes Father   . Other Brother        Alpha 1  . Cancer Maternal Grandfather        prostate  . Hypertension Maternal Grandfather   . Early death Paternal Grandfather   . Alcohol abuse Paternal Grandfather   . Healthy Daughter   . Anemia Maternal Grandmother   . Other Daughter        pulmonary stenosis  . Healthy Son     CURRENT MEDICATIONS:  Outpatient Encounter Medications as of 12/10/2019  Medication Sig  . [DISCONTINUED] dexamethasone (DECADRON) 4 MG tablet Take 10 pills (40mg ) by mouth once daily for 4 days.  . cyclobenzaprine (FLEXERIL) 10 MG tablet Take 1 tablet (10 mg total) by mouth every 8 (eight) hours as needed for muscle spasms. (Patient not taking: Reported on 12/10/2019)  . valACYclovir (VALTREX) 1000 MG tablet Take 2 at time feels cold sore and then 2 more in 24 hours (Patient not taking: Reported on 12/10/2019)   No facility-administered encounter medications on file as of 12/10/2019.    ALLERGIES:  No Known Allergies   PHYSICAL EXAM:  ECOG Performance status: 1  Vitals:   12/10/19 0944  BP: 127/83  Pulse: (!) 108  Resp: 18  Temp: 98.9 F (37.2 C)  SpO2: 97%   Filed Weights   12/10/19 0944  Weight: 163 lb 1.6 oz (74 kg)      Physical Exam Constitutional:      Appearance: Normal appearance. She is normal weight.  Cardiovascular:     Rate and Rhythm: Normal rate and regular rhythm.     Heart sounds: Normal heart sounds.  Pulmonary:     Effort: Pulmonary effort is normal.     Breath sounds: Normal breath sounds.  Abdominal:     General: Bowel sounds are normal.     Palpations: Abdomen is soft.  Musculoskeletal:        General: Normal range of motion.  Skin:    General: Skin is warm.  Neurological:     Mental Status: She is alert and oriented to person, place, and time. Mental status is  at baseline.  Psychiatric:        Mood and Affect: Mood normal.        Behavior: Behavior normal.        Thought Content: Thought content normal.        Judgment: Judgment normal.      LABORATORY DATA:  I have reviewed the labs as listed.  CBC    Component Value Date/Time   WBC 8.0 12/10/2019 0928   RBC 4.59 12/10/2019 0928   HGB 13.8 12/10/2019 0928   HGB 14.2 08/23/2019 1117  HCT 43.3 12/10/2019 0928   HCT 41.5 08/23/2019 1117   PLT 112 (L) 12/10/2019 0928   PLT 49 (LL) 08/23/2019 1117   MCV 94.3 12/10/2019 0928   MCV 89 08/23/2019 1117   MCH 30.1 12/10/2019 0928   MCHC 31.9 12/10/2019 0928   RDW 13.4 12/10/2019 0928   RDW 12.4 08/23/2019 1117   LYMPHSABS 2.8 12/10/2019 0928   LYMPHSABS 2.9 07/05/2019 1226   MONOABS 0.7 12/10/2019 0928   EOSABS 0.1 12/10/2019 0928   EOSABS 0.0 07/05/2019 1226   BASOSABS 0.0 12/10/2019 0928   BASOSABS 0.1 07/05/2019 1226   CMP Latest Ref Rng & Units 11/26/2019 11/01/2019 10/15/2019  Glucose 70 - 99 mg/dL 811(X) 726(O) 035(D)  BUN 6 - 20 mg/dL 7 14 12   Creatinine 0.44 - 1.00 mg/dL 9.74 1.63  Sodium 135 - 145 mmol/L 138 133(L) 139  Potassium 3.5 - 5.1 mmol/L 4.9 4.0 4.9  Chloride 98 - 111 mmol/L 102 101 104  CO2 22 - 32 mmol/L 28 24 27   Calcium 8.9 - 10.3 mg/dL 9.2 9.2 9.4  Total Protein 6.5 - 8.1 g/dL 7.1 7.2 7.5  Total Bilirubin 0.3 - 1.2 mg/dL 0.5 0.5 0.4  Alkaline Phos 38 - 126 U/L 65 63 63  AST 15 - 41 U/L 17 18 15   ALT 0 - 44 U/L 17 19 13     All questions were answered to patient's stated satisfaction. Encouraged patient to call with any new concerns or questions before his next visit to the cancer center and we can certain see him sooner, if needed.     ASSESSMENT & PLAN:  Thrombocytopenia ([facility]) 1.  ITP: -She has a history of thrombocytopenia dating back to 2016 when her platelet count ranged between 40 and 70. -She reportedly had thrombocytopenia during her pregnancy also. -She reported having menses lasting 2-1/2  days every 2 weeks for the past few years.  No increase in the bleeding. -She also reports very easy bruising particular in the upper thighs for the past 10 years and stable. 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  She reports that she has been clean for 3 years. -An ultrasound of her abdomen on 04/05/2016 showed normal-sized spleen.  No other pathology seen. -CBC on 07/05/2019 showed platelet count of 38. -Nutritional deficiency work-up was negative.  Hepatitis C antibody was positive.  We checked a hepatitis C RNA PCR which was negative. -Patient reports she had an ablation of her uterus to control her periods.  She reports she only has very light dark bleeding now. -If she does need platelet count more than 50 for prior procedure we will treat give a trial of dexamethasone 40 mg x 4 days. -Labs done on 11/26/2019 showed platelet count of 21 -We will try a dose of dexamethasone 40 mg for 4 days. -Labs done on 12/10/2019 showed increase in platelet to 112. -We will see her back in 3 weeks with repeat labs.     Orders placed this encounter:  Orders Placed This Encounter  Procedures  . CBC      04/07/2016, FNP-C [facility] 01/26/2020 [phone]

## 2019-12-22 ENCOUNTER — Other Ambulatory Visit (HOSPITAL_COMMUNITY): Payer: Self-pay | Admitting: Nurse Practitioner

## 2019-12-31 ENCOUNTER — Inpatient Hospital Stay (HOSPITAL_BASED_OUTPATIENT_CLINIC_OR_DEPARTMENT_OTHER): Payer: Medicaid Other | Admitting: Nurse Practitioner

## 2019-12-31 ENCOUNTER — Other Ambulatory Visit: Payer: Self-pay

## 2019-12-31 ENCOUNTER — Inpatient Hospital Stay (HOSPITAL_COMMUNITY): Payer: Medicaid Other | Attending: Hematology

## 2019-12-31 DIAGNOSIS — D693 Immune thrombocytopenic purpura: Secondary | ICD-10-CM | POA: Diagnosis present

## 2019-12-31 DIAGNOSIS — F17210 Nicotine dependence, cigarettes, uncomplicated: Secondary | ICD-10-CM | POA: Diagnosis not present

## 2019-12-31 DIAGNOSIS — D696 Thrombocytopenia, unspecified: Secondary | ICD-10-CM | POA: Diagnosis not present

## 2019-12-31 LAB — CBC
HCT: 43 % (ref 36.0–46.0)
Hemoglobin: 13.8 g/dL (ref 12.0–15.0)
MCH: 30.3 pg (ref 26.0–34.0)
MCHC: 32.1 g/dL (ref 30.0–36.0)
MCV: 94.3 fL (ref 80.0–100.0)
Platelets: 78 10*3/uL — ABNORMAL LOW (ref 150–400)
RBC: 4.56 MIL/uL (ref 3.87–5.11)
RDW: 13.5 % (ref 11.5–15.5)
WBC: 6.8 10*3/uL (ref 4.0–10.5)
nRBC: 0 % (ref 0.0–0.2)

## 2019-12-31 NOTE — Assessment & Plan Note (Signed)
1.  ITP: -She has a history of thrombocytopenia dating back to 2016 when her platelet count ranged between 40 and 70. -She reportedly had thrombocytopenia during her pregnancy also. -She reported having menses lasting 2-1/2 days every 2 weeks for the past few years.  No increase in the bleeding. -She also reports very easy bruising particular in the upper thighs for the past 10 years and stable. 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  She reports that she has been clean for 3 years. -An ultrasound of her abdomen on 04/05/2016 showed normal-sized spleen.  No other pathology seen. -CBC on 07/05/2019 showed platelet count of 38. -Nutritional deficiency work-up was negative.  Hepatitis C antibody was positive.  We checked a hepatitis C RNA PCR which was negative. -Patient reports she had an ablation of her uterus to control her periods.  She reports she only has very light dark bleeding now. -If she does need platelet count more than 50 for prior procedure we will treat give a trial of dexamethasone 40 mg x 4 days. -Labs done on 11/26/2019 showed platelet count of 21 -We will try a dose of dexamethasone 40 mg for 4 days. -Labs done on 12/10/2019 showed increase in platelet to 112. -Labs done on 12/31/2019 showed platelet count 78 -We will see her back in 3 weeks with repeat labs.

## 2019-12-31 NOTE — Progress Notes (Signed)
[facility] [address], [place] [address]   CLINIC:  Medical Oncology/Hematology  PCP:  Inc, [facility] [address] [place] [address] [phone]   REASON FOR VISIT: Follow-up for thrombocytopenia   CURRENT THERAPY: Observation   INTERVAL HISTORY:  [name] 29 y.o. female returns for routine follow-up for thrombocytopenia.  Patient reports she is still feeling good.  She reports the steroids made her feel better for short period of time.  She denies any petechiae.  She denies any unusual bleeding or bruising. Denies any nausea, vomiting, or diarrhea. Denies any new pains. Had not noticed any recent bleeding such as epistaxis, hematuria or hematochezia. Denies recent chest pain on exertion, shortness of breath on minimal exertion, pre-syncopal episodes, or palpitations. Denies any numbness or tingling in hands or feet. Denies any recent fevers, infections, or recent hospitalizations. Patient reports appetite at 100% and energy level at 100%.  She is eating well maintain her weight at this time.   REVIEW OF SYSTEMS:  Review of Systems  Neurological: Positive for dizziness.  All other systems reviewed and are negative.    PAST MEDICAL/SURGICAL HISTORY:  Past Medical History:  Diagnosis Date  . Alcohol abuse   . Maternal alloimmune thrombocytopenia complicating pregnancy   . Narcotic abuse ([facility])   . Thrombocytopenia ([facility])   . Thrombocytopenia ([facility]) 07/07/2019   07/06/19 platelets 38 ck platelet profile   Past Surgical History:  Procedure Laterality Date  . BILATERAL SALPINGECTOMY Bilateral 11/18/2014   Procedure: BILATERAL SALPINGECTOMY;  Surgeon: Lazaro Arms, MD;  Location: [facility];  Service: Obstetrics;  Laterality: Bilateral;  . CESAREAN SECTION  2011  . CESAREAN SECTION  11/26/2011   Procedure: CESAREAN SECTION;  Surgeon: Lazaro Arms, MD;  Location: [facility];  Service: Gynecology;  Laterality: N/A;  . CESAREAN SECTION  N/A 11/18/2014   Procedure: CESAREAN SECTION;  Surgeon: Lazaro Arms, MD;  Location: [facility];  Service: Obstetrics;  Laterality: N/A;  . DILATATION AND CURETTAGE/HYSTEROSCOPY WITH MINERVA N/A 11/03/2019   Procedure: DILATATION AND CURETTAGE/HYSTEROSCOPY WITH MINERVA;  Surgeon: Lazaro Arms, MD;  Location: [facility];  Service: Gynecology;  Laterality: N/A;     SOCIAL HISTORY:  Social History   Socioeconomic History  . Marital status: Married    Spouse name: Not on file  . Number of children: 3  . Years of education: Not on file  . Highest education level: Not on file  Occupational History  . Occupation: umemployed  Tobacco Use  . Smoking status: Current Some Day Smoker    Packs/day: 0.25    Years: 5.00    Pack years: 1.25    Types: Cigarettes  . Smokeless tobacco: Never Used  Vaping Use  . Vaping Use: Never used  Substance and Sexual Activity  . Alcohol use: Not Currently  . Drug use: No  . Sexual activity: Not Currently    Birth control/protection: Surgical  Other Topics Concern  . Not on file  Social History Narrative  . Not on file   Social Determinants of Health   Financial Resource Strain:   . Difficulty of Paying Living Expenses:   Food Insecurity:   . Worried About Programme researcher, broadcasting/film/video in the Last Year:   . Barista in the Last Year:   Transportation Needs:   . Freight forwarder (Medical):   [name] Lack of Transportation (Non-Medical):   Physical Activity:   . Days of Exercise per  Week:   . Minutes of Exercise per Session:   Stress:   . Feeling of Stress :   Social Connections:   . Frequency of Communication with Friends and Family:   . Frequency of Social Gatherings with Friends and Family:   . Attends Religious Services:   . Active Member of Clubs or Organizations:   . Attends Banker Meetings:   [name] Marital Status:   Intimate Partner Violence:   . Fear of Current or Ex-Partner:   . Emotionally Abused:   [name] Physically Abused:   .  Sexually Abused:     FAMILY HISTORY:  Family History  Problem Relation Age of Onset  . Cancer Mother 44       breast   . Hypertension Father   . Diabetes Father   . Other Brother        Alpha 1  . Cancer Maternal Grandfather        prostate  . Hypertension Maternal Grandfather   . Early death Paternal Grandfather   . Alcohol abuse Paternal Grandfather   . Healthy Daughter   . Anemia Maternal Grandmother   . Other Daughter        pulmonary stenosis  . Healthy Son     CURRENT MEDICATIONS:  Outpatient Encounter Medications as of 12/31/2019  Medication Sig  . cyclobenzaprine (FLEXERIL) 10 MG tablet Take 1 tablet (10 mg total) by mouth every 8 (eight) hours as needed for muscle spasms. (Patient not taking: Reported on 12/10/2019)  . valACYclovir (VALTREX) 1000 MG tablet Take 2 at time feels cold sore and then 2 more in 24 hours (Patient not taking: Reported on 12/10/2019)   No facility-administered encounter medications on file as of 12/31/2019.    ALLERGIES:  No Known Allergies   PHYSICAL EXAM:  ECOG Performance status: 1  Vitals:   12/31/19 0934  BP: (!) 125/58  Pulse: 76  Resp: 18  Temp: 98.4 F (36.9 C)  SpO2: 100%   Filed Weights   12/31/19 0934  Weight: 166 lb 6.4 oz (75.5 kg)   Physical Exam Constitutional:      Appearance: Normal appearance. She is normal weight.  Musculoskeletal:        General: Normal range of motion.  Skin:    General: Skin is warm.  Neurological:     Mental Status: She is alert and oriented to person, place, and time. Mental status is at baseline.  Psychiatric:        Mood and Affect: Mood normal.        Behavior: Behavior normal.        Thought Content: Thought content normal.        Judgment: Judgment normal.      LABORATORY DATA:  I have reviewed the labs as listed.  CBC    Component Value Date/Time   WBC 6.8 12/31/2019 0835   RBC 4.56 12/31/2019 0835   HGB 13.8 12/31/2019 0835   HGB 14.2 08/23/2019 1117   HCT 43.0  12/31/2019 0835   HCT 41.5 08/23/2019 1117   PLT 78 (L) 12/31/2019 0835   PLT 49 (LL) 08/23/2019 1117   MCV 94.3 12/31/2019 0835   MCV 89 08/23/2019 1117   MCH 30.3 12/31/2019 0835   MCHC 32.1 12/31/2019 0835   RDW 13.5 12/31/2019 0835   RDW 12.4 08/23/2019 1117   LYMPHSABS 2.8 12/10/2019 0928   LYMPHSABS 2.9 07/05/2019 1226   MONOABS 0.7 12/10/2019 0928   EOSABS 0.1 12/10/2019 2505  EOSABS 0.0 07/05/2019 1226   BASOSABS 0.0 12/10/2019 0928   BASOSABS 0.1 07/05/2019 1226   CMP Latest Ref Rng & Units 11/26/2019 11/01/2019 10/15/2019  Glucose 70 - 99 mg/dL 505(L) 976(B) 341(P)  BUN 6 - 20 mg/dL 7 14 12   Creatinine 0.44 - 1.00 mg/dL 3.79 0.24  Sodium 135 - 145 mmol/L 138 133(L) 139  Potassium 3.5 - 5.1 mmol/L 4.9 4.0 4.9  Chloride 98 - 111 mmol/L 102 101 104  CO2 22 - 32 mmol/L 28 24 27   Calcium 8.9 - 10.3 mg/dL 9.2 9.2 9.4  Total Protein 6.5 - 8.1 g/dL 7.1 7.2 7.5  Total Bilirubin 0.3 - 1.2 mg/dL 0.5 0.5 0.4  Alkaline Phos 38 - 126 U/L 65 63 63  AST 15 - 41 U/L 17 18 15   ALT 0 - 44 U/L 17 19 13     All questions were answered to patient's stated satisfaction. Encouraged patient to call with any new concerns or questions before his next visit to the cancer center and we can certain see him sooner, if needed.     ASSESSMENT & PLAN:  Thrombocytopenia (HCC) 1.  ITP: -She has a history of thrombocytopenia dating back to 2016 when her platelet count ranged between 40 and 70. -She reportedly had thrombocytopenia during her pregnancy also. -She reported having menses lasting 2-1/2 days every 2 weeks for the past few years.  No increase in the bleeding. -She also reports very easy bruising particular in the upper thighs for the past 10 years and stable. 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  She reports that she has been clean for 3 years. -An ultrasound of her abdomen on 04/05/2016 showed normal-sized spleen.  No other pathology  seen. -CBC on 07/05/2019 showed platelet count of 38. -Nutritional deficiency work-up was negative.  Hepatitis C antibody was positive.  We checked a hepatitis C RNA PCR which was negative. -Patient reports she had an ablation of her uterus to control her periods.  She reports she only has very light dark bleeding now. -If she does need platelet count more than 50 for prior procedure we will treat give a trial of dexamethasone 40 mg x 4 days. -Labs done on 11/26/2019 showed platelet count of 21 -We will try a dose of dexamethasone 40 mg for 4 days. -Labs done on 12/10/2019 showed increase in platelet to 112. -Labs done on 12/31/2019 showed platelet count 78 -We will see her back in 3 weeks with repeat labs.     Orders placed this encounter:  Orders Placed This Encounter  Procedures  . CBC with Differential/Platelet  . Comprehensive metabolic panel      09/02/2019, FNP-C [facility] [phone]

## 2020-01-31 ENCOUNTER — Inpatient Hospital Stay (HOSPITAL_COMMUNITY): Payer: 59 | Attending: Hematology

## 2020-01-31 ENCOUNTER — Inpatient Hospital Stay (HOSPITAL_BASED_OUTPATIENT_CLINIC_OR_DEPARTMENT_OTHER): Payer: 59 | Admitting: Hematology

## 2020-01-31 ENCOUNTER — Other Ambulatory Visit: Payer: Self-pay

## 2020-01-31 VITALS — BP 120/55 | HR 81 | Temp 97.1°F | Resp 18 | Wt 162.8 lb

## 2020-01-31 DIAGNOSIS — D696 Thrombocytopenia, unspecified: Secondary | ICD-10-CM

## 2020-01-31 DIAGNOSIS — D693 Immune thrombocytopenic purpura: Secondary | ICD-10-CM | POA: Insufficient documentation

## 2020-01-31 DIAGNOSIS — R5383 Other fatigue: Secondary | ICD-10-CM | POA: Diagnosis not present

## 2020-01-31 LAB — CBC WITH DIFFERENTIAL/PLATELET
Abs Immature Granulocytes: 0.03 10*3/uL (ref 0.00–0.07)
Basophils Absolute: 0.1 10*3/uL (ref 0.0–0.1)
Basophils Relative: 1 %
Eosinophils Absolute: 0.1 10*3/uL (ref 0.0–0.5)
Eosinophils Relative: 1 %
HCT: 42.5 % (ref 36.0–46.0)
Hemoglobin: 13.7 g/dL (ref 12.0–15.0)
Immature Granulocytes: 0 %
Lymphocytes Relative: 29 %
Lymphs Abs: 3.1 10*3/uL (ref 0.7–4.0)
MCH: 30.1 pg (ref 26.0–34.0)
MCHC: 32.2 g/dL (ref 30.0–36.0)
MCV: 93.4 fL (ref 80.0–100.0)
Monocytes Absolute: 0.7 10*3/uL (ref 0.1–1.0)
Monocytes Relative: 6 %
Neutro Abs: 6.7 10*3/uL (ref 1.7–7.7)
Neutrophils Relative %: 63 %
Platelets: 86 10*3/uL — ABNORMAL LOW (ref 150–400)
RBC: 4.55 MIL/uL (ref 3.87–5.11)
RDW: 12.7 % (ref 11.5–15.5)
WBC: 10.6 10*3/uL — ABNORMAL HIGH (ref 4.0–10.5)
nRBC: 0 % (ref 0.0–0.2)

## 2020-01-31 LAB — COMPREHENSIVE METABOLIC PANEL
ALT: 12 U/L (ref 0–44)
AST: 15 U/L (ref 15–41)
Albumin: 4.5 g/dL (ref 3.5–5.0)
Alkaline Phosphatase: 73 U/L (ref 38–126)
Anion gap: 8 (ref 5–15)
BUN: 8 mg/dL (ref 6–20)
CO2: 28 mmol/L (ref 22–32)
Calcium: 9.1 mg/dL (ref 8.9–10.3)
Chloride: 103 mmol/L (ref 98–111)
Creatinine, Ser: 0.85 mg/dL (ref 0.44–1.00)
GFR calc Af Amer: >60 mL/min (ref >60)
GFR calc non Af Amer: >60 mL/min (ref >60)
Glucose, Bld: 115 mg/dL — ABNORMAL HIGH (ref 70–99)
Potassium: 4.2 mmol/L (ref 3.5–5.1)
Sodium: 139 mmol/L (ref 135–145)
Total Bilirubin: 0.4 mg/dL (ref 0.3–1.2)
Total Protein: 7.2 g/dL (ref 6.5–8.1)

## 2020-01-31 MED ORDER — CYANOCOBALAMIN 1000 MCG/ML IJ SOLN
1000.0000 ug | Freq: Once | INTRAMUSCULAR | Status: AC
  Administered 2020-01-31: 1000 ug via INTRAMUSCULAR

## 2020-01-31 MED ORDER — CYANOCOBALAMIN 1000 MCG/ML IJ SOLN
INTRAMUSCULAR | Status: AC
  Filled 2020-01-31: qty 1

## 2020-01-31 NOTE — Progress Notes (Signed)
Pt given B12 injection in left deltoid. Pt tolerated injection well with no complaints. Pt stable and discharged home ambulatory.

## 2020-01-31 NOTE — Patient Instructions (Signed)
[facility] at [facility] Discharge Instructions  You were seen today by Dr. Ellin Saba. He went over your recent results. You received your vitamin B12 injection today; purchase vitamin B12 1 mg over the counter and take once weekly. Dr. Ellin Saba will see you back in 3 months for labs and follow up.   Thank you for choosing [facility] at [facility] to provide your oncology and hematology care.  To afford each patient quality time with our provider, please arrive at least 15 minutes before your scheduled appointment time.   If you have a lab appointment with the Cancer Center please come in thru the Main Entrance and check in at the main information desk  You need to re-schedule your appointment should you arrive 10 or more minutes late.  We strive to give you quality time with our providers, and arriving late affects you and other patients whose appointments are after yours.  Also, if you no show three or more times for appointments you may be dismissed from the clinic at the providers discretion.     Again, thank you for choosing [facility].  Our hope is that these requests will decrease the amount of time that you wait before being seen by our physicians.       _____________________________________________________________  Should you have questions after your visit to [facility], please contact our office at [phone] between the hours of 8:00 a.m. and 4:30 p.m.  Voicemails left after 4:00 p.m. will not be returned until the following business day.  For prescription refill requests, have your pharmacy contact our office and allow 72 hours.    Cancer Center Support Programs:   > Cancer Support Group  2nd Tuesday of the month 1pm-2pm, Journey Room

## 2020-01-31 NOTE — Progress Notes (Signed)
[facility] [address], [place] [address]   CLINIC:  Medical Oncology/Hematology  PCP:  Inc, [facility] [address] [place] / [place] [address]  [phone]  REASON FOR VISIT:  Follow-up for thrombocytopenia  PRIOR THERAPY: None  CURRENT THERAPY: Observation  INTERVAL HISTORY:  [name], a 29 y.o. female, returns for routine follow-up for her thrombocytopenia. Caroline Brooks was last contacted via phone on 08/16/2019.  Today she reports that she has been feeling fatigued for the past few weeks. She had a D&C with endometrial ablation on 5/12 with Dr. Duane Lope, but she reports that she continues having periods. She denies having any bruising or nosebleeds, but she reports that she has been getting cold hands and feet.   REVIEW OF SYSTEMS:  Review of Systems  Constitutional: Positive for fatigue (mild). Negative for appetite change.  HENT:   Negative for nosebleeds.   Neurological: Positive for dizziness.  Hematological: Does not bruise/bleed easily.  All other systems reviewed and are negative.   PAST MEDICAL/SURGICAL HISTORY:  Past Medical History:  Diagnosis Date  . Alcohol abuse   . Maternal alloimmune thrombocytopenia complicating pregnancy   . Narcotic abuse (HCC)   . Thrombocytopenia (HCC)   . Thrombocytopenia (HCC) 07/07/2019   07/06/19 platelets 38 ck platelet profile   Past Surgical History:  Procedure Laterality Date  . BILATERAL SALPINGECTOMY Bilateral 11/18/2014   Procedure: BILATERAL SALPINGECTOMY;  Surgeon: Lazaro Arms, MD;  Location: [facility];  Service: Obstetrics;  Laterality: Bilateral;  . CESAREAN SECTION  2011  . CESAREAN SECTION  11/26/2011   Procedure: CESAREAN SECTION;  Surgeon: Lazaro Arms, MD;  Location: [facility];  Service: Gynecology;  Laterality: N/A;  . CESAREAN SECTION N/A 11/18/2014   Procedure: CESAREAN SECTION;  Surgeon: Lazaro Arms, MD;  Location: [facility];  Service: Obstetrics;   Laterality: N/A;  . DILATATION AND CURETTAGE/HYSTEROSCOPY WITH MINERVA N/A 11/03/2019   Procedure: DILATATION AND CURETTAGE/HYSTEROSCOPY WITH MINERVA;  Surgeon: Lazaro Arms, MD;  Location: [facility];  Service: Gynecology;  Laterality: N/A;    SOCIAL HISTORY:  Social History   Socioeconomic History  . Marital status: Married    Spouse name: Not on file  . Number of children: 3  . Years of education: Not on file  . Highest education level: Not on file  Occupational History  . Occupation: umemployed  Tobacco Use  . Smoking status: Current Some Day Smoker    Packs/day: 0.25    Years: 5.00    Pack years: 1.25    Types: Cigarettes  . Smokeless tobacco: Never Used  Vaping Use  . Vaping Use: Never used  Substance and Sexual Activity  . Alcohol use: Not Currently  . Drug use: No  . Sexual activity: Not Currently    Birth control/protection: Surgical  Other Topics Concern  . Not on file  Social History Narrative  . Not on file   Social Determinants of Health   Financial Resource Strain:   . Difficulty of Paying Living Expenses:   Food Insecurity:   . Worried About Programme researcher, broadcasting/film/video in the Last Year:   . Barista in the Last Year:   Transportation Needs:   . Freight forwarder (Medical):   [name] Lack of Transportation (Non-Medical):   Physical Activity:   . Days of Exercise per Week:   . Minutes of Exercise per Session:   Stress:   . Feeling of Stress :  Social Connections:   . Frequency of Communication with Friends and Family:   . Frequency of Social Gatherings with Friends and Family:   . Attends Religious Services:   . Active Member of Clubs or Organizations:   . Attends Banker Meetings:   [name] Marital Status:   Intimate Partner Violence:   . Fear of Current or Ex-Partner:   . Emotionally Abused:   [name] Physically Abused:   . Sexually Abused:     FAMILY HISTORY:  Family History  Problem Relation Age of Onset  . Cancer Mother 53        breast   . Hypertension Father   . Diabetes Father   . Other Brother        Alpha 1  . Cancer Maternal Grandfather        prostate  . Hypertension Maternal Grandfather   . Early death Paternal Grandfather   . Alcohol abuse Paternal Grandfather   . Healthy Daughter   . Anemia Maternal Grandmother   . Other Daughter        pulmonary stenosis  . Healthy Son     CURRENT MEDICATIONS:  Current Outpatient Medications  Medication Sig Dispense Refill  . cyclobenzaprine (FLEXERIL) 10 MG tablet Take 1 tablet (10 mg total) by mouth every 8 (eight) hours as needed for muscle spasms. 30 tablet 1  . valACYclovir (VALTREX) 1000 MG tablet Take 2 at time feels cold sore and then 2 more in 24 hours 30 tablet 1   No current facility-administered medications for this visit.    ALLERGIES:  No Known Allergies  PHYSICAL EXAM:  Performance status (ECOG): 0 - Asymptomatic  Vitals:   01/31/20 1517  BP: (!) 120/55  Pulse: 81  Resp: 18  Temp: (!) 97.1 F (36.2 C)  SpO2: 100%   Wt Readings from Last 3 Encounters:  01/31/20 162 lb 12.8 oz (73.8 kg)  12/31/19 166 lb 6.4 oz (75.5 kg)  12/10/19 163 lb 1.6 oz (74 kg)   Physical Exam Vitals reviewed.  Constitutional:      Appearance: Normal appearance.  Cardiovascular:     Rate and Rhythm: Normal rate and regular rhythm.     Heart sounds: Normal heart sounds.  Pulmonary:     Effort: Pulmonary effort is normal.     Breath sounds: Normal breath sounds.  Neurological:     General: No focal deficit present.     Mental Status: She is alert and oriented to person, place, and time.  Psychiatric:        Mood and Affect: Mood normal.        Behavior: Behavior normal.     LABORATORY DATA:  I have reviewed the labs as listed.  CBC Latest Ref Rng & Units 01/31/2020 12/31/2019 12/10/2019  WBC 4.0 - 10.5 K/uL 10.6(H) 6.8 8.0  Hemoglobin 12.0 - 15.0 g/dL 63.1 49.7 02.6  Hematocrit 36 - 46 % 42.5 43.0 43.3  Platelets 150 - 400 K/uL 86(L) 78(L) 112(L)     CMP Latest Ref Rng & Units 01/31/2020 11/26/2019 11/01/2019  Glucose 70 - 99 mg/dL 378(H) 885(O) 277(A)  BUN 6 - 20 mg/dL 8 7 14   Creatinine 0.44 - 1.00 mg/dL 1.28 7.86  Sodium 135 - 145 mmol/L 139 138 133(L)  Potassium 3.5 - 5.1 mmol/L 4.2 4.9 4.0  Chloride 98 - 111 mmol/L 103 102 101  CO2 22 - 32 mmol/L 28 28 24   Calcium 8.9 - 10.3 mg/dL 9.1 9.2 9.2  Total  Protein 6.5 - 8.1 g/dL 7.2 7.1 7.2  Total Bilirubin 0.3 - 1.2 mg/dL 0.4 0.5 0.5  Alkaline Phos 38 - 126 U/L 73 65 63  AST 15 - 41 U/L 15 17 18   ALT 0 - 44 U/L 12 17 19       Component Value Date/Time   RBC 4.55 01/31/2020 1412   MCV 93.4 01/31/2020 1412   MCV 89 08/23/2019 1117   MCH 30.1 01/31/2020 1412   MCHC 32.2 01/31/2020 1412   RDW 12.7 01/31/2020 1412   RDW 12.4 08/23/2019 1117   LYMPHSABS 3.1 01/31/2020 1412   LYMPHSABS 2.9 07/05/2019 1226   MONOABS 0.7 01/31/2020 1412   EOSABS 0.1 01/31/2020 1412   EOSABS 0.0 07/05/2019 1226   BASOSABS 0.1 01/31/2020 1412   BASOSABS 0.1 07/05/2019 1226   Lab Results  Component Value Date   LDH 120 11/26/2019   LDH 126 10/15/2019   LDH 122 07/19/2019    DIAGNOSTIC IMAGING:  I have independently reviewed the scans and discussed with the patient. No results found.   ASSESSMENT:  1.  ITP: -Ultrasound of the abdomen on 04/05/2016 shows normal spleen. -She had thrombocytopenia dating back to 2016 and also had thrombocytopenia during pregnancy. -She has heavy menses lasting 2 and half days every 2 weeks for the past few years. -Nutritional deficiency work-up was negative.  Hepatitis C antibody was positive.  We checked a hepatitis C RNA PCR which was negative. -She had a Minerva procedure done on 11/03/2019.  She reports that it has not helped her. -Her platelet count dropped to 21 on 11/26/2019.  She was treated with dexamethasone and her platelet count improved to 112 on 12/10/2019.   PLAN:  1.  ITP: -We reviewed labs from today which shows platelet count of 86. -She  does not have any nosebleeds or easy bruising. -Her hemoglobin has been within normal limits. -I have recommended follow-up in 3 months with repeat CBC. -She was told to come back sooner should she develop any easy bruising or bleeding. -She complains of extreme fatigue.  We will give her B12 injection to see if it helps.  She was also told to take B12 1 mg tablet daily if the injection helps.   Orders placed this encounter:  No orders of the defined types were placed in this encounter.    01/26/2020, MD [facility] [phone]   I, [facility], am acting as a scribe for Dr. [phone].  I, Drue Second MD, have reviewed the above documentation for accuracy and completeness, and I agree with the above.

## 2020-05-02 ENCOUNTER — Inpatient Hospital Stay (HOSPITAL_COMMUNITY): Payer: Medicaid Other | Attending: Hematology | Admitting: Hematology

## 2020-05-02 ENCOUNTER — Inpatient Hospital Stay (HOSPITAL_COMMUNITY): Payer: Medicaid Other

## 2020-05-02 ENCOUNTER — Other Ambulatory Visit: Payer: Self-pay

## 2020-05-02 VITALS — BP 108/52 | HR 78 | Temp 97.0°F | Resp 18 | Wt 171.7 lb

## 2020-05-02 DIAGNOSIS — R5383 Other fatigue: Secondary | ICD-10-CM | POA: Diagnosis not present

## 2020-05-02 DIAGNOSIS — D696 Thrombocytopenia, unspecified: Secondary | ICD-10-CM

## 2020-05-02 DIAGNOSIS — N920 Excessive and frequent menstruation with regular cycle: Secondary | ICD-10-CM | POA: Diagnosis not present

## 2020-05-02 DIAGNOSIS — Z803 Family history of malignant neoplasm of breast: Secondary | ICD-10-CM | POA: Diagnosis not present

## 2020-05-02 DIAGNOSIS — D693 Immune thrombocytopenic purpura: Secondary | ICD-10-CM | POA: Diagnosis not present

## 2020-05-02 DIAGNOSIS — F17210 Nicotine dependence, cigarettes, uncomplicated: Secondary | ICD-10-CM | POA: Insufficient documentation

## 2020-05-02 LAB — CBC WITH DIFFERENTIAL/PLATELET
Abs Immature Granulocytes: 0.01 10*3/uL (ref 0.00–0.07)
Basophils Absolute: 0.1 10*3/uL (ref 0.0–0.1)
Basophils Relative: 1 %
Eosinophils Absolute: 0.2 10*3/uL (ref 0.0–0.5)
Eosinophils Relative: 2 %
HCT: 46.7 % — ABNORMAL HIGH (ref 36.0–46.0)
Hemoglobin: 15 g/dL (ref 12.0–15.0)
Immature Granulocytes: 0 %
Lymphocytes Relative: 32 %
Lymphs Abs: 2.7 10*3/uL (ref 0.7–4.0)
MCH: 30.5 pg (ref 26.0–34.0)
MCHC: 32.1 g/dL (ref 30.0–36.0)
MCV: 94.9 fL (ref 80.0–100.0)
Monocytes Absolute: 0.6 10*3/uL (ref 0.1–1.0)
Monocytes Relative: 7 %
Neutro Abs: 5 10*3/uL (ref 1.7–7.7)
Neutrophils Relative %: 58 %
Platelets: 52 10*3/uL — ABNORMAL LOW (ref 150–400)
RBC: 4.92 MIL/uL (ref 3.87–5.11)
RDW: 13.4 % (ref 11.5–15.5)
WBC: 8.5 10*3/uL (ref 4.0–10.5)
nRBC: 0 % (ref 0.0–0.2)

## 2020-05-02 LAB — LACTATE DEHYDROGENASE: LDH: 121 U/L (ref 98–192)

## 2020-05-02 NOTE — Progress Notes (Signed)
[facility] [address], [place] [address]   CLINIC:  Medical Oncology/Hematology  PCP:  Inc, [facility] [address] [place] / [place] [address]  [phone]  REASON FOR VISIT:  Follow-up for thrombocytopenia  PRIOR THERAPY: None  CURRENT THERAPY: Observation  INTERVAL HISTORY:  [name], a 29 y.o. female, returns for routine follow-up for her thrombocytopenia. Mishayla was last seen on 01/31/2020.  Today she reports feeling okay. She continues getting her periods every 2 weeks and is being followed by her OBGYN for additional procedures. She continues feeling drained after every period. She denies having nosebleeds, hematochezia or hematuria. She continues taking vitamin B12. She reports that she gained weight and was more hungry when she took steroids.   REVIEW OF SYSTEMS:  Review of Systems  Constitutional: Positive for fatigue (50%). Negative for appetite change.  HENT:   Negative for nosebleeds.   Gastrointestinal: Negative for blood in stool.  Genitourinary: Negative for hematuria.   Neurological: Positive for dizziness and numbness (hands & feet).  All other systems reviewed and are negative.   PAST MEDICAL/SURGICAL HISTORY:  Past Medical History:  Diagnosis Date  . Alcohol abuse   . Maternal alloimmune thrombocytopenia complicating pregnancy   . Narcotic abuse (HCC)   . Thrombocytopenia (HCC)   . Thrombocytopenia (HCC) 07/07/2019   07/06/19 platelets 38 ck platelet profile   Past Surgical History:  Procedure Laterality Date  . BILATERAL SALPINGECTOMY Bilateral 11/18/2014   Procedure: BILATERAL SALPINGECTOMY;  Surgeon: Lazaro Arms, MD;  Location: [facility];  Service: Obstetrics;  Laterality: Bilateral;  . CESAREAN SECTION  2011  . CESAREAN SECTION  11/26/2011   Procedure: CESAREAN SECTION;  Surgeon: Lazaro Arms, MD;  Location: [facility];  Service: Gynecology;  Laterality: N/A;  . CESAREAN SECTION N/A  11/18/2014   Procedure: CESAREAN SECTION;  Surgeon: Lazaro Arms, MD;  Location: [facility];  Service: Obstetrics;  Laterality: N/A;  . DILATATION AND CURETTAGE/HYSTEROSCOPY WITH MINERVA N/A 11/03/2019   Procedure: DILATATION AND CURETTAGE/HYSTEROSCOPY WITH MINERVA;  Surgeon: Lazaro Arms, MD;  Location: [facility];  Service: Gynecology;  Laterality: N/A;    SOCIAL HISTORY:  Social History   Socioeconomic History  . Marital status: Married    Spouse name: Not on file  . Number of children: 3  . Years of education: Not on file  . Highest education level: Not on file  Occupational History  . Occupation: umemployed  Tobacco Use  . Smoking status: Current Some Day Smoker    Packs/day: 0.25    Years: 5.00    Pack years: 1.25    Types: Cigarettes  . Smokeless tobacco: Never Used  Vaping Use  . Vaping Use: Never used  Substance and Sexual Activity  . Alcohol use: Not Currently  . Drug use: No  . Sexual activity: Not Currently    Birth control/protection: Surgical  Other Topics Concern  . Not on file  Social History Narrative  . Not on file   Social Determinants of Health   Financial Resource Strain: Medium Risk  . Difficulty of Paying Living Expenses: Somewhat hard  Food Insecurity: No Food Insecurity  . Worried About Programme researcher, broadcasting/film/video in the Last Year: Never true  . Ran Out of Food in the Last Year: Never true  Transportation Needs: No Transportation Needs  . Lack of Transportation (Medical): No  . Lack of Transportation (Non-Medical): No  Physical Activity: Inactive  . Days of  Exercise per Week: 0 days  . Minutes of Exercise per Session: 0 min  Stress: No Stress Concern Present  . Feeling of Stress : Not at all  Social Connections: Moderately Isolated  . Frequency of Communication with Friends and Family: More than three times a week  . Frequency of Social Gatherings with Friends and Family: Twice a week  . Attends Religious Services: Never  . Active Member of Clubs or  Organizations: No  . Attends Banker Meetings: Never  . Marital Status: Married  Catering manager Violence: Not At Risk  . Fear of Current or Ex-Partner: No  . Emotionally Abused: No  . Physically Abused: No  . Sexually Abused: No    FAMILY HISTORY:  Family History  Problem Relation Age of Onset  . Cancer Mother 69       breast   . Hypertension Father   . Diabetes Father   . Other Brother        Alpha 1  . Cancer Maternal Grandfather        prostate  . Hypertension Maternal Grandfather   . Early death Paternal Grandfather   . Alcohol abuse Paternal Grandfather   . Healthy Daughter   . Anemia Maternal Grandmother   . Other Daughter        pulmonary stenosis  . Healthy Son     CURRENT MEDICATIONS:  Current Outpatient Medications  Medication Sig Dispense Refill  . cyclobenzaprine (FLEXERIL) 10 MG tablet Take 1 tablet (10 mg total) by mouth every 8 (eight) hours as needed for muscle spasms. 30 tablet 1  . valACYclovir (VALTREX) 1000 MG tablet Take 2 at time feels cold sore and then 2 more in 24 hours 30 tablet 1   No current facility-administered medications for this visit.    ALLERGIES:  No Known Allergies  PHYSICAL EXAM:  Performance status (ECOG): 0 - Asymptomatic  Vitals:   05/02/20 1446  BP: (!) 108/52  Pulse: 78  Resp: 18  Temp: (!) 97 F (36.1 C)  SpO2: 99%   Wt Readings from Last 3 Encounters:  05/02/20 171 lb 11.8 oz (77.9 kg)  01/31/20 162 lb 12.8 oz (73.8 kg)  12/31/19 166 lb 6.4 oz (75.5 kg)   Physical Exam Vitals reviewed.  Constitutional:      Appearance: Normal appearance.  Neurological:     General: No focal deficit present.     Mental Status: She is alert and oriented to person, place, and time.  Psychiatric:        Mood and Affect: Mood normal.        Behavior: Behavior normal.     LABORATORY DATA:  I have reviewed the labs as listed.  CBC Latest Ref Rng & Units 05/02/2020 01/31/2020 12/31/2019  WBC 4.0 - 10.5 K/uL 8.5  10.6(H) 6.8  Hemoglobin 12.0 - 15.0 g/dL 60.7 37.1 06.2  Hematocrit 36 - 46 % 46.7(H) 42.5 43.0  Platelets 150 - 400 K/uL 52(L) 86(L) 78(L)   CMP Latest Ref Rng & Units 01/31/2020 11/26/2019 11/01/2019  Glucose 70 - 99 mg/dL 694(W) 546(E) 703(J)  BUN 6 - 20 mg/dL 8 7 14   Creatinine 0.44 - 1.00 mg/dL 0.09 3.81  Sodium 135 - 145 mmol/L 139 138 133(L)  Potassium 3.5 - 5.1 mmol/L 4.2 4.9 4.0  Chloride 98 - 111 mmol/L 103 102 101  CO2 22 - 32 mmol/L 28 28 24   Calcium 8.9 - 10.3 mg/dL 9.1 9.2 9.2  Total Protein 6.5 - 8.1  g/dL 7.2 7.1 7.2  Total Bilirubin 0.3 - 1.2 mg/dL 0.4 0.5 0.5  Alkaline Phos 38 - 126 U/L 73 65 63  AST 15 - 41 U/L 15 17 18   ALT 0 - 44 U/L 12 17 19       Component Value Date/Time   RBC 4.92 05/02/2020 1412   MCV 94.9 05/02/2020 1412   MCV 89 08/23/2019 1117   MCH 30.5 05/02/2020 1412   MCHC 32.1 05/02/2020 1412   RDW 13.4 05/02/2020 1412   RDW 12.4 08/23/2019 1117   LYMPHSABS 2.7 05/02/2020 1412   LYMPHSABS 2.9 07/05/2019 1226   MONOABS 0.6 05/02/2020 1412   EOSABS 0.2 05/02/2020 1412   EOSABS 0.0 07/05/2019 1226   BASOSABS 0.1 05/02/2020 1412   BASOSABS 0.1 07/05/2019 1226   Lab Results  Component Value Date   LDH 121 05/02/2020   LDH 120 11/26/2019   LDH 126 10/15/2019    DIAGNOSTIC IMAGING:  I have independently reviewed the scans and discussed with the patient. No results found.   ASSESSMENT:  1. ITP: -Ultrasound of the abdomen on 04/05/2016 shows normal spleen. -She had thrombocytopenia dating back to 2016 and also had thrombocytopenia during pregnancy. -She has heavy menses lasting 2 and half days every 2 weeks for the past few years. -Nutritional deficiency work-up was negative. Hepatitis C antibody was positive. We checked a hepatitis C RNA PCR which was negative. -She had a Minerva procedure done on 11/03/2019.  She reports that it has not helped her. -Her platelet count dropped to 21 on 11/26/2019.  She was treated with dexamethasone and  her platelet count improved to 112 on 12/10/2019.   PLAN:  1.  Immune mediated thrombocytopenia: -CBC today shows platelet count 52 with hemoglobin 15 and white count normal. -She is continuing to have menstrual bleeding twice monthly. -She will follow up with her GYN and will have further procedures done. -She will let 01/26/2020 know if she has to have any surgical procedure.  We will give dexamethasone 40 mg for 4 days at least 2 weeks prior to surgery.  She had previously good response with platelet count going up to 112 and staying above 50,000 for at least 5 months. -We will see her back in 10 weeks for follow-up with repeat labs.  Orders placed this encounter:  No orders of the defined types were placed in this encounter.    12/12/2019, MD [facility] [phone]   I, [facility], am acting as a scribe for Dr. [phone].  I, Drue Second MD, have reviewed the above documentation for accuracy and completeness, and I agree with the above.

## 2020-05-02 NOTE — Patient Instructions (Signed)
[facility] at [facility] Discharge Instructions  You were seen today by Dr. Ellin Saba. He went over your recent results. Proceed with your appointment with Dr. Despina Hidden. Dr. Ellin Saba will see you back in 10 weeks for labs and follow up.   Thank you for choosing [facility] at [facility] to provide your oncology and hematology care.  To afford each patient quality time with our provider, please arrive at least 15 minutes before your scheduled appointment time.   If you have a lab appointment with the Cancer Center please come in thru the Main Entrance and check in at the main information desk  You need to re-schedule your appointment should you arrive 10 or more minutes late.  We strive to give you quality time with our providers, and arriving late affects you and other patients whose appointments are after yours.  Also, if you no show three or more times for appointments you may be dismissed from the clinic at the providers discretion.     Again, thank you for choosing [facility].  Our hope is that these requests will decrease the amount of time that you wait before being seen by our physicians.       _____________________________________________________________  Should you have questions after your visit to [facility], please contact our office at [phone] between the hours of 8:00 a.m. and 4:30 p.m.  Voicemails left after 4:00 p.m. will not be returned until the following business day.  For prescription refill requests, have your pharmacy contact our office and allow 72 hours.    Cancer Center Support Programs:   > Cancer Support Group  2nd Tuesday of the month 1pm-2pm, Journey Room

## 2020-06-03 ENCOUNTER — Other Ambulatory Visit: Payer: Self-pay

## 2020-06-03 ENCOUNTER — Ambulatory Visit
Admission: EM | Admit: 2020-06-03 | Discharge: 2020-06-03 | Disposition: A | Discharge status: Home / Self Care | Payer: Medicaid Other | Attending: Internal Medicine | Admitting: Internal Medicine

## 2020-06-03 DIAGNOSIS — B354 Tinea corporis: Secondary | ICD-10-CM

## 2020-06-03 MED ORDER — TERBINAFINE HCL 250 MG PO TABS: 250.0000 mg | ORAL_TABLET | Freq: Every day | ORAL | 0 refills | Status: AC

## 2020-06-03 NOTE — ED Provider Notes (Signed)
[facility]    CSN: [id number] Arrival date & time: 06/03/20  1337      History   Chief Complaint Chief Complaint  Patient presents with  . Rash    HPI Caroline Brooks is a 29 y.o. female comes to the urgent care with a 5-day history of pruritic rash over the right forearm and both legs.  Patient noticed the rash about 5 days ago and since then she is noticed a few more of these rashes develop in other areas.  No pain.  Rash is itchy.  No drainage from the rash.[name]   HPI  Past Medical History:  Diagnosis Date  . Alcohol abuse   . Maternal alloimmune thrombocytopenia complicating pregnancy   . Narcotic abuse (HCC)   . Thrombocytopenia (HCC)   . Thrombocytopenia (HCC) 07/07/2019   07/06/19 platelets 38 ck platelet profile    Patient Active Problem List   Diagnosis Date Noted  . Screening examination for STD (sexually transmitted disease) 08/23/2019  . Menorrhagia with irregular cycle 08/23/2019  . Thrombocytopenia (HCC) 07/07/2019  . Irregular periods 07/05/2019  . History of thrombocytopenia 07/05/2019  . Pregnancy examination or test, negative result 07/05/2019  . BV (bacterial vaginosis) 07/05/2019  . Vaginal discharge 07/05/2019  . Thrombocytopenia affecting pregnancy, antepartum (HCC) 11/18/2014  . Elevated BP 11/09/2014  . Hx of preeclampsia, prior pregnancy, currently pregnant 10/19/2014  . Previous cesarean delivery, antepartum 10/19/2014  . Depression affecting pregnancy, antepartum 10/19/2014  . Marijuana use 10/19/2014  . Supervision of normal pregnancy 04/28/2014    Past Surgical History:  Procedure Laterality Date  . BILATERAL SALPINGECTOMY Bilateral 11/18/2014   Procedure: BILATERAL SALPINGECTOMY;  Surgeon: Lazaro Arms, MD;  Location: [facility];  Service: Obstetrics;  Laterality: Bilateral;  . CESAREAN SECTION  2011  . CESAREAN SECTION  11/26/2011   Procedure: CESAREAN SECTION;  Surgeon: Lazaro Arms, MD;  Location: [facility];  Service:  Gynecology;  Laterality: N/A;  . CESAREAN SECTION N/A 11/18/2014   Procedure: CESAREAN SECTION;  Surgeon: Lazaro Arms, MD;  Location: [facility];  Service: Obstetrics;  Laterality: N/A;  . DILATATION AND CURETTAGE/HYSTEROSCOPY WITH MINERVA N/A 11/03/2019   Procedure: DILATATION AND CURETTAGE/HYSTEROSCOPY WITH MINERVA;  Surgeon: Lazaro Arms, MD;  Location: [facility];  Service: Gynecology;  Laterality: N/A;    OB History    Gravida  3   Para  3   Term  3   Preterm  0   AB  0   Living  3     SAB  0   IAB  0   Ectopic  0   Multiple  0   Live Births  2            Home Medications    Prior to Admission medications   Medication Sig Start Date End Date Taking? Authorizing Provider  cyclobenzaprine (FLEXERIL) 10 MG tablet Take 1 tablet (10 mg total) by mouth every 8 (eight) hours as needed for muscle spasms. 11/03/19   Lazaro Arms, MD  terbinafine (LAMISIL) 250 MG tablet Take 1 tablet (250 mg total) by mouth daily for 7 days. 06/03/20 06/10/20  Merrilee Jansky, MD  valACYclovir (VALTREX) 1000 MG tablet Take 2 at time feels cold sore and then 2 more in 24 hours 08/24/19   Adline Potter, NP    Family History Family History  Problem Relation Age of Onset  . Cancer Mother 18       breast   .  Hypertension Father   . Diabetes Father   . Other Brother        Alpha 1  . Cancer Maternal Grandfather        prostate  . Hypertension Maternal Grandfather   . Early death Paternal Grandfather   . Alcohol abuse Paternal Grandfather   . Healthy Daughter   . Anemia Maternal Grandmother   . Other Daughter        pulmonary stenosis  . Healthy Son     Social History Social History   Tobacco Use  . Smoking status: Current Some Day Smoker    Packs/day: 0.25    Years: 5.00    Pack years: 1.25    Types: Cigarettes  . Smokeless tobacco: Never Used  Vaping Use  . Vaping Use: Never used  Substance Use Topics  . Alcohol use: Not Currently  . Drug use: No      Allergies   Patient has no known allergies.   Review of Systems Review of Systems  Respiratory: Negative.   Skin: Positive for rash. Negative for color change, pallor and wound.  Neurological: Negative.      Physical Exam Triage Vital Signs ED Triage Vitals  Enc Vitals Group     BP 06/03/20 1448 (!) 102/50     Pulse --      Resp 06/03/20 1448 20     Temp 06/03/20 1448 (!) 79 F (26.1 C)     Temp src --      SpO2 06/03/20 1448 98 %     Weight --      Height --      Head Circumference --      Peak Flow --      Pain Score 06/03/20 1445 0     Pain Loc --      Pain Edu? --      Excl. in GC? --    No data found.  Updated Vital Signs BP (!) 102/50   Temp (!) 79 F (26.1 C)   Resp 20   LMP 05/04/2020 (Approximate) Comment: still has periods   SpO2 98%   Visual Acuity Right Eye Distance:   Left Eye Distance:   Bilateral Distance:    Right Eye Near:   Left Eye Near:    Bilateral Near:     Physical Exam Vitals and nursing note reviewed.  Constitutional:      General: She is not in acute distress.    Appearance: She is not ill-appearing.  Cardiovascular:     Rate and Rhythm: Normal rate and regular rhythm.  Skin:    Comments: Well-circumscribed rash with central clearing on the left forearm.  Rash is about 1 inch in longest diameter.  Patient has similar rash over the lower extremities.  Neurological:     Mental Status: She is alert.      UC Treatments / Results  Labs (all labs ordered are listed, but only abnormal results are displayed) Labs Reviewed - No data to display  EKG   Radiology No results found.  Procedures Procedures (including critical care time)  Medications Ordered in UC Medications - No data to display  Initial Impression / Assessment and Plan / UC Course  I have reviewed the triage vital signs and the nursing notes.  Pertinent labs & imaging results that were available during my care of the patient were reviewed by me  and considered in my medical decision making (see chart for details).     1.  Tinea corporis: Lamisil 250 mg daily for 7 days If rash worsens please return to the urgent care I expect the rash to improve over the next few days. Final Clinical Impressions(s) / UC Diagnoses   Final diagnoses:  Tinea corporis   Discharge Instructions   None    ED Prescriptions    Medication Sig Dispense Auth. Provider   terbinafine (LAMISIL) 250 MG tablet Take 1 tablet (250 mg total) by mouth daily for 7 days. 7 tablet Landis Dowdy, Britta Mccreedy, MD     PDMP not reviewed this encounter.   Merrilee Jansky, MD 06/03/20 1537

## 2020-06-03 NOTE — ED Triage Notes (Signed)
Pt presents with rash to right arm, believed to be ring worm

## 2020-07-11 ENCOUNTER — Inpatient Hospital Stay (HOSPITAL_COMMUNITY): Payer: Medicaid Other | Attending: Hematology

## 2020-07-11 ENCOUNTER — Ambulatory Visit (HOSPITAL_COMMUNITY): Payer: Medicaid Other | Admitting: Hematology

## 2020-09-05 ENCOUNTER — Other Ambulatory Visit: Payer: Self-pay

## 2020-09-05 ENCOUNTER — Inpatient Hospital Stay (HOSPITAL_COMMUNITY): Payer: Medicaid Other | Attending: Hematology

## 2020-09-05 DIAGNOSIS — Z9079 Acquired absence of other genital organ(s): Secondary | ICD-10-CM | POA: Diagnosis not present

## 2020-09-05 DIAGNOSIS — F17210 Nicotine dependence, cigarettes, uncomplicated: Secondary | ICD-10-CM | POA: Diagnosis not present

## 2020-09-05 DIAGNOSIS — Z8249 Family history of ischemic heart disease and other diseases of the circulatory system: Secondary | ICD-10-CM | POA: Diagnosis not present

## 2020-09-05 DIAGNOSIS — Z8042 Family history of malignant neoplasm of prostate: Secondary | ICD-10-CM | POA: Insufficient documentation

## 2020-09-05 DIAGNOSIS — D693 Immune thrombocytopenic purpura: Secondary | ICD-10-CM | POA: Diagnosis not present

## 2020-09-05 DIAGNOSIS — D696 Thrombocytopenia, unspecified: Secondary | ICD-10-CM

## 2020-09-05 DIAGNOSIS — Z833 Family history of diabetes mellitus: Secondary | ICD-10-CM | POA: Diagnosis not present

## 2020-09-05 DIAGNOSIS — Z803 Family history of malignant neoplasm of breast: Secondary | ICD-10-CM | POA: Diagnosis not present

## 2020-09-05 LAB — CBC WITH DIFFERENTIAL/PLATELET
Abs Immature Granulocytes: 0.03 10*3/uL (ref 0.00–0.07)
Basophils Absolute: 0.1 10*3/uL (ref 0.0–0.1)
Basophils Relative: 1 %
Eosinophils Absolute: 0.1 10*3/uL (ref 0.0–0.5)
Eosinophils Relative: 1 %
HCT: 46 % (ref 36.0–46.0)
Hemoglobin: 14.9 g/dL (ref 12.0–15.0)
Immature Granulocytes: 0 %
Lymphocytes Relative: 23 %
Lymphs Abs: 2.6 10*3/uL (ref 0.7–4.0)
MCH: 31.3 pg (ref 26.0–34.0)
MCHC: 32.4 g/dL (ref 30.0–36.0)
MCV: 96.6 fL (ref 80.0–100.0)
Monocytes Absolute: 0.7 10*3/uL (ref 0.1–1.0)
Monocytes Relative: 6 %
Neutro Abs: 7.7 10*3/uL (ref 1.7–7.7)
Neutrophils Relative %: 69 %
Platelets: 64 10*3/uL — ABNORMAL LOW (ref 150–400)
RBC: 4.76 MIL/uL (ref 3.87–5.11)
RDW: 11.9 % (ref 11.5–15.5)
WBC: 11.2 10*3/uL — ABNORMAL HIGH (ref 4.0–10.5)
nRBC: 0 % (ref 0.0–0.2)

## 2020-09-05 LAB — LACTATE DEHYDROGENASE: LDH: 171 U/L (ref 98–192)

## 2020-09-12 ENCOUNTER — Other Ambulatory Visit: Payer: Self-pay

## 2020-09-12 ENCOUNTER — Inpatient Hospital Stay (HOSPITAL_BASED_OUTPATIENT_CLINIC_OR_DEPARTMENT_OTHER): Payer: Medicaid Other | Admitting: Hematology

## 2020-09-12 VITALS — BP 117/57 | HR 76 | Temp 97.0°F | Resp 18 | Wt 180.6 lb

## 2020-09-12 DIAGNOSIS — F17210 Nicotine dependence, cigarettes, uncomplicated: Secondary | ICD-10-CM | POA: Diagnosis not present

## 2020-09-12 DIAGNOSIS — D693 Immune thrombocytopenic purpura: Secondary | ICD-10-CM | POA: Diagnosis not present

## 2020-09-12 DIAGNOSIS — Z833 Family history of diabetes mellitus: Secondary | ICD-10-CM | POA: Diagnosis not present

## 2020-09-12 DIAGNOSIS — Z8249 Family history of ischemic heart disease and other diseases of the circulatory system: Secondary | ICD-10-CM | POA: Diagnosis not present

## 2020-09-12 DIAGNOSIS — Z9079 Acquired absence of other genital organ(s): Secondary | ICD-10-CM | POA: Diagnosis not present

## 2020-09-12 DIAGNOSIS — Z8042 Family history of malignant neoplasm of prostate: Secondary | ICD-10-CM | POA: Diagnosis not present

## 2020-09-12 DIAGNOSIS — Z803 Family history of malignant neoplasm of breast: Secondary | ICD-10-CM | POA: Diagnosis not present

## 2020-09-12 NOTE — Progress Notes (Signed)
[facility] [address], [place] [address]   CLINIC:  Medical Oncology/Hematology  PCP:  [facility], [facility] [address] [place] / [place] [address]  [phone]  REASON FOR VISIT:  Follow-up for immune thrombocytopenia  PRIOR THERAPY: None  CURRENT THERAPY: Observation  INTERVAL HISTORY:  [name], a 30 y.o. female, returns for routine follow-up for her immune thrombocytopenia. Caroline Brooks was last seen on 05/02/2020.  Today she reports feeling fair. She reports having nausea and vomiting with chest tightness last week, which she attributes to a virus. The chest tightness is generally around her upper chest and sternum. Her menses has improved, though she still has heavy bleeding for 2 days, though not as often as before, and sometimes her menses skip a month. She sees specks of blood when she blows her nose first thing in the morning, but denies hematochezia, hematuria or frank nosebleeds. She continues having bruising on her legs which are slowly resolving. She continues taking women's vitamins but nothing else. She notes that when her platelet counts drop, she develops fatigue, nausea, vomiting and bruising. She tolerated the Decadron well the previous time.   REVIEW OF SYSTEMS:  Review of Systems  Constitutional: Positive for fatigue (depleted). Negative for appetite change.  HENT:   Negative for nosebleeds.   Respiratory: Positive for chest tightness (x1 week).   Cardiovascular: Positive for palpitations.  Gastrointestinal: Positive for nausea (x1 week) and vomiting (x1 week). Negative for blood in stool.  Genitourinary: Positive for menstrual problem (heaving bleeding x2 days every 3-4 weeks). Negative for hematuria.   Hematological: Bruises/bleeds easily (resolving bruising on legs).  Psychiatric/Behavioral: The patient is nervous/anxious.   All other systems reviewed and are negative.   PAST MEDICAL/SURGICAL HISTORY:   Past Medical History:  Diagnosis Date  . Alcohol abuse   . Maternal alloimmune thrombocytopenia complicating pregnancy   . Narcotic abuse (HCC)   . Thrombocytopenia (HCC)   . Thrombocytopenia (HCC) 07/07/2019   07/06/19 platelets 38 ck platelet profile   Past Surgical History:  Procedure Laterality Date  . BILATERAL SALPINGECTOMY Bilateral 11/18/2014   Procedure: BILATERAL SALPINGECTOMY;  Surgeon: Lazaro Arms, MD;  Location: [facility];  Service: Obstetrics;  Laterality: Bilateral;  . CESAREAN SECTION  2011  . CESAREAN SECTION  11/26/2011   Procedure: CESAREAN SECTION;  Surgeon: Lazaro Arms, MD;  Location: [facility];  Service: Gynecology;  Laterality: N/A;  . CESAREAN SECTION N/A 11/18/2014   Procedure: CESAREAN SECTION;  Surgeon: Lazaro Arms, MD;  Location: [facility];  Service: Obstetrics;  Laterality: N/A;  . DILATATION AND CURETTAGE/HYSTEROSCOPY WITH MINERVA N/A 11/03/2019   Procedure: DILATATION AND CURETTAGE/HYSTEROSCOPY WITH MINERVA;  Surgeon: Lazaro Arms, MD;  Location: [facility];  Service: Gynecology;  Laterality: N/A;    SOCIAL HISTORY:  Social History   Socioeconomic History  . Marital status: Married    Spouse name: Not on file  . Number of children: 3  . Years of education: Not on file  . Highest education level: Not on file  Occupational History  . Occupation: umemployed  Tobacco Use  . Smoking status: Current Some Day Smoker    Packs/day: 0.25    Years: 5.00    Pack years: 1.25    Types: Cigarettes  . Smokeless tobacco: Never Used  Vaping Use  . Vaping Use: Never used  Substance and Sexual Activity  . Alcohol use: Not Currently  . Drug use: No  . Sexual activity:  Not Currently    Birth control/protection: Surgical  Other Topics Concern  . Not on file  Social History Narrative  . Not on file   Social Determinants of Health   Financial Resource Strain: Medium Risk  . Difficulty of Paying Living Expenses: Somewhat hard  Food Insecurity: No Food Insecurity  .  Worried About Programme researcher, broadcasting/film/videounning Out of Food in the Last Year: Never true  . Ran Out of Food in the Last Year: Never true  Transportation Needs: No Transportation Needs  . Lack of Transportation (Medical): No  . Lack of Transportation (Non-Medical): No  Physical Activity: Inactive  . Days of Exercise per Week: 0 days  . Minutes of Exercise per Session: 0 min  Stress: No Stress Concern Present  . Feeling of Stress : Not at all  Social Connections: Moderately Isolated  . Frequency of Communication with Friends and Family: More than three times a week  . Frequency of Social Gatherings with Friends and Family: Twice a week  . Attends Religious Services: Never  . Active Member of Clubs or Organizations: No  . Attends BankerClub or Organization Meetings: Never  . Marital Status: Married  Catering managerntimate Partner Violence: Not At Risk  . Fear of Current or Ex-Partner: No  . Emotionally Abused: No  . Physically Abused: No  . Sexually Abused: No    FAMILY HISTORY:  Family History  Problem Relation Age of Onset  . Cancer Mother [id number]       breast   . Hypertension Father   . Diabetes Father   . Other Brother        Alpha 1  . Cancer Maternal Grandfather        prostate  . Hypertension Maternal Grandfather   . Early death Paternal Grandfather   . Alcohol abuse Paternal Grandfather   . Healthy Daughter   . Anemia Maternal Grandmother   . Other Daughter        pulmonary stenosis  . Healthy Son     CURRENT MEDICATIONS:  Current Outpatient Medications  Medication Sig Dispense Refill  . cyclobenzaprine (FLEXERIL) 10 MG tablet Take 1 tablet (10 mg total) by mouth every 8 (eight) hours as needed for muscle spasms. (Patient not taking: Reported on 09/12/2020) 30 tablet 1  . valACYclovir (VALTREX) 1000 MG tablet Take 2 at time feels cold sore and then 2 more in 24 hours (Patient not taking: Reported on 09/12/2020) 30 tablet 1   No current facility-administered medications for this visit.    ALLERGIES:  No Known  Allergies  PHYSICAL EXAM:  Performance status (ECOG): 0 - Asymptomatic  Vitals:   09/12/20 1102  BP: (!) 117/57  Pulse: 76  Resp: 18  Temp: (!) 97 F (36.1 C)  SpO2: 100%   Wt Readings from Last 3 Encounters:  09/12/20 180 lb 9.6 oz (81.9 kg)  05/02/20 171 lb 11.8 oz (77.9 kg)  01/31/20 162 lb 12.8 oz (73.8 kg)   Physical Exam Vitals reviewed.  Constitutional:      Appearance: Normal appearance.  Cardiovascular:     Rate and Rhythm: Normal rate and regular rhythm.     Pulses: Normal pulses.     Heart sounds: Normal heart sounds.  Pulmonary:     Effort: Pulmonary effort is normal.     Breath sounds: Normal breath sounds.  Chest:  Breasts:     Right: No supraclavicular adenopathy.     Left: No supraclavicular adenopathy.    Abdominal:     Palpations: Abdomen  is soft. There is no hepatomegaly, splenomegaly or mass.     Tenderness: There is no abdominal tenderness.  Lymphadenopathy:     Cervical: No cervical adenopathy.     Upper Body:     Right upper body: No supraclavicular adenopathy.     Left upper body: No supraclavicular adenopathy.  Neurological:     General: No focal deficit present.     Mental Status: She is alert and oriented to person, place, and time.  Psychiatric:        Mood and Affect: Mood normal.        Behavior: Behavior normal.     LABORATORY DATA:  I have reviewed the labs as listed.  CBC Latest Ref Rng & Units 09/05/2020 05/02/2020 01/31/2020  WBC 4.0 - 10.5 K/uL 11.2(H) 8.5 10.6(H)  Hemoglobin 12.0 - 15.0 g/dL 16.1 09.6 04.5  Hematocrit 36.0 - 46.0 % 46.0 46.7(H) 42.5  Platelets 150 - 400 K/uL 64(L) 52(L) 86(L)   CMP Latest Ref Rng & Units 01/31/2020 11/26/2019 11/01/2019  Glucose 70 - 99 mg/dL 409(W) 119(J) 478(G)  BUN 6 - 20 mg/dL 8 7 14   Creatinine 0.44 - 1.00 mg/dL 9.56 2.13  Sodium 135 - 145 mmol/L 139 138 133(L)  Potassium 3.5 - 5.1 mmol/L 4.2 4.9 4.0  Chloride 98 - 111 mmol/L 103 102 101  CO2 22 - 32 mmol/L 28 28 24   Calcium  8.9 - 10.3 mg/dL 9.1 9.2 9.2  Total Protein 6.5 - 8.1 g/dL 7.2 7.1 7.2  Total Bilirubin 0.3 - 1.2 mg/dL 0.4 0.5 0.5  Alkaline Phos 38 - 126 U/L 73 65 63  AST 15 - 41 U/L 15 17 18   ALT 0 - 44 U/L 12 17 19       Component Value Date/Time   RBC 4.76 09/05/2020 1410   MCV 96.6 09/05/2020 1410   MCV 89 08/23/2019 1117   MCH 31.3 09/05/2020 1410   MCHC 32.4 09/05/2020 1410   RDW 11.9 09/05/2020 1410   RDW 12.4 08/23/2019 1117   LYMPHSABS 2.6 09/05/2020 1410   LYMPHSABS 2.9 07/05/2019 1226   MONOABS 0.7 09/05/2020 1410   EOSABS 0.1 09/05/2020 1410   EOSABS 0.0 07/05/2019 1226   BASOSABS 0.1 09/05/2020 1410   BASOSABS 0.1 07/05/2019 1226   Lab Results  Component Value Date   LDH 171 09/05/2020   LDH 121 05/02/2020   LDH 120 11/26/2019    DIAGNOSTIC IMAGING:  I have independently reviewed the scans and discussed with the patient. No results found.   ASSESSMENT:  1. ITP: -Ultrasound of the abdomen on 04/05/2016 shows normal spleen. -She had thrombocytopenia dating back to 2016 and also had thrombocytopenia during pregnancy. -She has heavy menses lasting 2 and half days every 2 weeks for the past few years. -Nutritional deficiency work-up was negative. Hepatitis C antibody was positive. We checked a hepatitis C RNA PCR which was negative. -She had a Minerva procedure done on 11/03/2019. She reports that it has not helped her. -Her platelet count dropped to 21 on 11/26/2019. She was treated with dexamethasone and her platelet count improved to 112 on 12/10/2019.   PLAN:  1.  Immune mediated thrombocytopenia: -She has 2 days of menstrual bleeding every 28 days.  Those 2 days are heavy. -Denies any nosebleeds, bleeding per rectum or hematuria. -She is not taking any medication.  Physical examination today did not reveal any major bruising. -CBC showed platelet count 64 with normal hemoglobin 14.9.  Differential of the white count  was normal.  LDH was 171. -Recommend follow-up in  6 months with repeat CBC and LDH. -She will come back sooner should she develop any easy bruising or bleeding. -She had very good response with dexamethasone previously and tolerated it very well.  We will use dexamethasone 40 mg x 4 days if her platelet count drops below 30,000 or active bleeding.  Orders placed this encounter:  Orders Placed This Encounter  Procedures  . CBC with Differential/Platelet  . Lactate dehydrogenase     Doreatha Massed, MD [facility] [phone]   I, Drue Second, am acting as a scribe for Dr. Payton Mccallum.  I, Doreatha Massed MD, have reviewed the above documentation for accuracy and completeness, and I agree with the above.

## 2020-09-12 NOTE — Patient Instructions (Signed)
[facility] Discharge Instructions  You were seen today by Dr. Katragadda. He went over your recent results. Dr. Katragadda will see you back in 6 months for labs and follow up.   Thank you for choosing [facility] at [facility] to provide your oncology and hematology care.  To afford each patient quality time with our provider, please arrive at least 15 minutes before your scheduled appointment time.   If you have a lab appointment with the Cancer Center please come in thru the Main Entrance and check in at the main information desk  You need to re-schedule your appointment should you arrive 10 or more minutes late.  We strive to give you quality time with our providers, and arriving late affects you and other patients whose appointments are after yours.  Also, if you no show three or more times for appointments you may be dismissed from the clinic at the providers discretion.     Again, thank you for choosing [facility].  Our hope is that these requests will decrease the amount of time that you wait before being seen by our physicians.       _____________________________________________________________  Should you have questions after your visit to  Cancer Center, please contact our office at [phone] between the hours of 8:00 a.m. and 4:30 p.m.  Voicemails left after 4:00 p.m. will not be returned until the following business day.  For prescription refill requests, have your pharmacy contact our office and allow 72 hours.    Cancer Center Support Programs:   > Cancer Support Group  2nd Tuesday of the month 1pm-2pm, Journey Room    

## 2021-03-28 ENCOUNTER — Inpatient Hospital Stay (HOSPITAL_COMMUNITY): Payer: Medicaid Other | Attending: Hematology

## 2021-04-04 ENCOUNTER — Ambulatory Visit (HOSPITAL_COMMUNITY): Payer: Medicaid Other | Admitting: Hematology

## 2021-04-13 ENCOUNTER — Inpatient Hospital Stay (HOSPITAL_COMMUNITY): Payer: PRIVATE HEALTH INSURANCE | Attending: Hematology

## 2021-04-13 ENCOUNTER — Other Ambulatory Visit: Payer: Self-pay

## 2021-04-13 DIAGNOSIS — D693 Immune thrombocytopenic purpura: Secondary | ICD-10-CM | POA: Diagnosis not present

## 2021-04-13 LAB — CBC WITH DIFFERENTIAL/PLATELET
Abs Immature Granulocytes: 0.04 10*3/uL (ref 0.00–0.07)
Basophils Absolute: 0.1 10*3/uL (ref 0.0–0.1)
Basophils Relative: 1 %
Eosinophils Absolute: 0.2 10*3/uL (ref 0.0–0.5)
Eosinophils Relative: 1 %
HCT: 45.8 % (ref 36.0–46.0)
Hemoglobin: 14.7 g/dL (ref 12.0–15.0)
Immature Granulocytes: 0 %
Lymphocytes Relative: 22 %
Lymphs Abs: 2.5 10*3/uL (ref 0.7–4.0)
MCH: 30.4 pg (ref 26.0–34.0)
MCHC: 32.1 g/dL (ref 30.0–36.0)
MCV: 94.8 fL (ref 80.0–100.0)
Monocytes Absolute: 0.8 10*3/uL (ref 0.1–1.0)
Monocytes Relative: 7 %
Neutro Abs: 7.8 10*3/uL — ABNORMAL HIGH (ref 1.7–7.7)
Neutrophils Relative %: 69 %
Platelets: 74 10*3/uL — ABNORMAL LOW (ref 150–400)
RBC: 4.83 MIL/uL (ref 3.87–5.11)
RDW: 13.2 % (ref 11.5–15.5)
WBC: 11.3 10*3/uL — ABNORMAL HIGH (ref 4.0–10.5)
nRBC: 0 % (ref 0.0–0.2)

## 2021-04-13 LAB — LACTATE DEHYDROGENASE: LDH: 158 U/L (ref 98–192)

## 2021-04-19 NOTE — Progress Notes (Signed)
Virtual Visit via Telephone Note [facility]  I connected with Caroline Brooks  on 04/20/21 at  3:01 PM by telephone and verified that I am speaking with the correct person using two identifiers.  Location: Patient: Home Provider: [facility]   I discussed the limitations, risks, security and privacy concerns of performing an evaluation and management service by telephone and the availability of in person appointments. I also discussed with the patient that there may be a patient responsible charge related to this service. The patient expressed understanding and agreed to proceed.   HISTORY OF PRESENT ILLNESS: Caroline Brooks follows at our clinic for immune thrombocytopenia.  She was last seen by Dr. Ellin Saba on 09/12/2020.  At today's visit, she reports feeling somewhat poorly, as she is recovering from what she assumes was an upper respiratory virus.  She reports that the "virus has been working its way through her family," and she is continuing to have some mild shortness of breath, cough, headache and sinus congestion, but that she is slowly getting better.  She denies any recent hospitalizations, new diagnoses, or changes in her baseline health status.  Regarding her ITP, she reports that for the past 2 weeks she has noticed some gum bleeding on both her top and bottom teeth, which she states has never happened before.  She has also noticed increased bruising on both her legs and her arms.  She reports that she has "little spots" on the palms of her hands, that have appeared intermittently there in the past.  She also reports onset of heavy menstrual bleeding 3 days ago; she had not had a period for the 6 months prior following endometrial ablation.  She has had intermittent epistaxis since childhood, which she says is no worse than usual.  She denies any hematemesis, hematochezia, melena, or hematuria.  She denies any mouth sores or wet purpura in her  oral cavity.  She reports that she feels more fatigued lately, but believes that this may be due to stress in her life.  She denies any nausea or vomiting.  No fever, chills, night sweats, unintentional weight loss.  She reports that her energy is 50% with 100% appetite.  She reports that she is maintaining a stable weight at this time.    OBSERVATIONS/OBJECTIVE: Review of Systems  Constitutional:  Positive for malaise/fatigue. Negative for chills, diaphoresis, fever and weight loss.  HENT:  Positive for nosebleeds.        Gum bleeding  Respiratory:  Positive for cough and shortness of breath.   Cardiovascular:  Negative for chest pain and palpitations.  Gastrointestinal:  Negative for abdominal pain, blood in stool, melena, nausea and vomiting.  Neurological:  Positive for headaches. Negative for dizziness.  Endo/Heme/Allergies:  Bruises/bleeds easily.    PHYSICAL EXAM (per limitations of virtual telephone visit): The patient is alert and oriented x 3, exhibiting adequate mentation, good mood, and ability to speak in full sentences and execute sound judgement.   ASSESSMENT & PLAN: 1.  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  She also had thrombocytopenia during her pregnancies. - Ultrasound of abdomen (04/05/2016) shows normal spleen - Nutritional deficiency work-up was negative.  Hepatitis C antibody was positive, but hepatitis C RNA PCR was negative. - Her platelet count dropped to 21 on 11/26/2019, but improved to 112 after being treated with dexamethasone. - She is not currently taking any medications. - She has heavy menses lasting 2  and half days every 2 weeks.  Minerva procedure done on 11/03/2019 without any initial improvement in her menstrual cycles, but she reports that since about March 2022 she had not had any menstrual cycles.  Heavy menstrual cycles returned in October 2022. - She admits to abnormal bruising and "small spots" on the  palms of her hands - She admits to gum bleeding, epistaxis, menorrhagia.  She denies any hematuria, hematemesis, hematochezia, or melena. - Most recent labs (04/13/2021): Platelets 74, at baseline.  LDH 158. - PLAN: No indication for treatment at this time, but due to symptoms of increased bruising and some mild bleeding episodes we will repeat platelets on Monday, 04/23/2021. - RTC in 3 months with repeat CBC and LDH. - Patient will come back sooner if she develops any easy bruising or bleeding. - She has previously had good response with dexamethasone, which we would plan to use in the future if her platelet count drops below 30,000 or she has active bleeding.   FOLLOW UP INSTRUCTIONS: - Repeat labs on 04/23/2021.  We will call patient with results and initiate treatment if necessary. - Repeat labs and RTC in 3 months.    I discussed the assessment and treatment plan with the patient. The patient was provided an opportunity to ask questions and all were answered. The patient agreed with the plan and demonstrated an understanding of the instructions.   The patient was advised to call back or seek an in-person evaluation if the symptoms worsen or if the condition fails to improve as anticipated.  I provided 25 minutes of non-face-to-face time during this encounter.   Carnella Guadalajara, PA-C 04/20/2021 4:15 PM

## 2021-04-20 ENCOUNTER — Other Ambulatory Visit: Payer: Self-pay

## 2021-04-20 ENCOUNTER — Encounter (HOSPITAL_COMMUNITY): Payer: Self-pay | Admitting: Physician Assistant

## 2021-04-20 ENCOUNTER — Inpatient Hospital Stay (HOSPITAL_BASED_OUTPATIENT_CLINIC_OR_DEPARTMENT_OTHER): Payer: Medicaid Other | Admitting: Physician Assistant

## 2021-04-20 VITALS — Wt 165.0 lb

## 2021-04-20 DIAGNOSIS — D693 Immune thrombocytopenic purpura: Secondary | ICD-10-CM

## 2021-04-20 DIAGNOSIS — D696 Thrombocytopenia, unspecified: Secondary | ICD-10-CM | POA: Diagnosis not present

## 2021-04-26 ENCOUNTER — Other Ambulatory Visit: Payer: Self-pay

## 2021-04-26 ENCOUNTER — Other Ambulatory Visit (HOSPITAL_COMMUNITY): Payer: Self-pay | Admitting: Physician Assistant

## 2021-04-26 ENCOUNTER — Inpatient Hospital Stay (HOSPITAL_COMMUNITY): Payer: PRIVATE HEALTH INSURANCE | Attending: Hematology

## 2021-04-26 DIAGNOSIS — D693 Immune thrombocytopenic purpura: Secondary | ICD-10-CM

## 2021-04-26 DIAGNOSIS — D696 Thrombocytopenia, unspecified: Secondary | ICD-10-CM | POA: Insufficient documentation

## 2021-04-26 LAB — CBC
HCT: 44.9 % (ref 36.0–46.0)
Hemoglobin: 14.7 g/dL (ref 12.0–15.0)
MCH: 31.3 pg (ref 26.0–34.0)
MCHC: 32.7 g/dL (ref 30.0–36.0)
MCV: 95.7 fL (ref 80.0–100.0)
Platelets: 37 10*3/uL — ABNORMAL LOW (ref 150–400)
RBC: 4.69 MIL/uL (ref 3.87–5.11)
RDW: 13.5 % (ref 11.5–15.5)
WBC: 8.7 10*3/uL (ref 4.0–10.5)
nRBC: 0 % (ref 0.0–0.2)

## 2021-04-26 LAB — LACTATE DEHYDROGENASE: LDH: 188 U/L (ref 98–192)

## 2021-04-26 MED ORDER — DEXAMETHASONE 4 MG PO TABS: 40.0000 mg | ORAL_TABLET | Freq: Every day | ORAL | 0 refills | Status: AC

## 2021-04-26 NOTE — Progress Notes (Signed)
-   Worsening thrombocytopenia noted with platelets 37 on 04/26/2021. - We will send prescription for dexamethasone 40 mg daily x4 days  - We will repeat CBC in 2 weeks. - Patient should call our office or seek immediate medical attention if she experiences increased abnormal bruising, petechial rash, severe headache with nausea/vomiting, or any abnormal bleeding.  *RN POOL: Please call patient with the above instructions and schedule patient for repeat CBC in 2 weeks.  Prescription has been sent to [facility] in [place].

## 2021-04-27 ENCOUNTER — Other Ambulatory Visit (HOSPITAL_COMMUNITY): Payer: Self-pay | Admitting: *Deleted

## 2021-04-27 DIAGNOSIS — D693 Immune thrombocytopenic purpura: Secondary | ICD-10-CM

## 2021-04-27 NOTE — Progress Notes (Signed)
Attempted to contact her.  Unable to leave a VM.

## 2021-04-27 NOTE — Progress Notes (Signed)
Patient notified of above and verbalized understanding.

## 2021-04-27 NOTE — Progress Notes (Signed)
You may be able to call her mother.  I believe she is listed in the chart as a person to conact?

## 2021-05-10 ENCOUNTER — Inpatient Hospital Stay (HOSPITAL_COMMUNITY): Payer: PRIVATE HEALTH INSURANCE

## 2021-07-20 ENCOUNTER — Inpatient Hospital Stay (HOSPITAL_COMMUNITY): Payer: 59 | Attending: Hematology

## 2021-07-20 DIAGNOSIS — D696 Thrombocytopenia, unspecified: Secondary | ICD-10-CM | POA: Insufficient documentation

## 2021-07-20 DIAGNOSIS — D693 Immune thrombocytopenic purpura: Secondary | ICD-10-CM

## 2021-07-20 LAB — CBC WITH DIFFERENTIAL/PLATELET
Abs Immature Granulocytes: 0.01 10*3/uL (ref 0.00–0.07)
Basophils Absolute: 0 10*3/uL (ref 0.0–0.1)
Basophils Relative: 1 %
Eosinophils Absolute: 0.2 10*3/uL (ref 0.0–0.5)
Eosinophils Relative: 2 %
HCT: 44.4 % (ref 36.0–46.0)
Hemoglobin: 14.7 g/dL (ref 12.0–15.0)
Immature Granulocytes: 0 %
Lymphocytes Relative: 33 %
Lymphs Abs: 2.2 10*3/uL (ref 0.7–4.0)
MCH: 31.5 pg (ref 26.0–34.0)
MCHC: 33.1 g/dL (ref 30.0–36.0)
MCV: 95.1 fL (ref 80.0–100.0)
Monocytes Absolute: 0.5 10*3/uL (ref 0.1–1.0)
Monocytes Relative: 7 %
Neutro Abs: 3.9 10*3/uL (ref 1.7–7.7)
Neutrophils Relative %: 57 %
Platelets: 58 10*3/uL — ABNORMAL LOW (ref 150–400)
RBC: 4.67 MIL/uL (ref 3.87–5.11)
RDW: 12.3 % (ref 11.5–15.5)
WBC: 6.9 10*3/uL (ref 4.0–10.5)
nRBC: 0 % (ref 0.0–0.2)

## 2021-07-20 LAB — VITAMIN B12: Vitamin B-12: 492 pg/mL (ref 180–914)

## 2021-07-20 LAB — FOLATE: Folate: 8.3 ng/mL (ref >5.9)

## 2021-07-24 LAB — METHYLMALONIC ACID, SERUM: Methylmalonic Acid, Quantitative: 154 nmol/L (ref 0–378)

## 2021-07-27 ENCOUNTER — Other Ambulatory Visit: Payer: Self-pay

## 2021-07-27 ENCOUNTER — Inpatient Hospital Stay (HOSPITAL_COMMUNITY): Payer: Medicaid Other | Attending: Hematology | Admitting: Physician Assistant

## 2021-07-27 DIAGNOSIS — D693 Immune thrombocytopenic purpura: Secondary | ICD-10-CM

## 2021-07-27 NOTE — Progress Notes (Signed)
Virtual Visit via Telephone Note [facility]  I connected with Caroline Brooks  07/27/21 at 2:06 PM by telephone and verified that I am speaking with the correct person using two identifiers.  Location: Patient: Home Provider: [facility]   I discussed the limitations, risks, security and privacy concerns of performing an evaluation and management service by telephone and the availability of in person appointments. I also discussed with the patient that there may be a patient responsible charge related to this service. The patient expressed understanding and agreed to proceed.   HISTORY OF PRESENT ILLNESS: 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  She was last evaluated by Rojelio Brenner PA-C via telemedicine visit on 04/20/2021.  At today's visit, she reports feeling fairly well.  Reports that she at her baseline health status and has not had any recent diagnoses or changes in her health.  She is doing better than she was at her last visit, and reports that many of her symptoms resolved after her most recent round of steroids.  She is no longer having any gum bleeding.  She reports that her periods are less heavy and that she is only having occasional light uterine bleeding.  She is not noticing the "little red spots" on her hands (suspected to be petechiae).  No epistaxis, hematuria, hematemesis, hematochezia, melena.  She denies any mouth sores or wet purpura in her oral cavity.  She does continue to have easy bruising.  She denies any nausea or vomiting.  No fever, chills, night sweats, unintentional weight loss.  She reports 75% energy with 100% appetite.  She reports that she is maintaining a stable weight at this time.    OBSERVATIONS/OBJECTIVE: Review of Systems  Constitutional:  Positive for malaise/fatigue. Negative for chills, diaphoresis, fever and weight loss.  Respiratory:  Negative for cough and  shortness of breath.   Cardiovascular:  Negative for chest pain and palpitations.  Gastrointestinal:  Negative for abdominal pain, blood in stool, melena, nausea and vomiting.  Neurological:  Negative for dizziness and headaches.  Endo/Heme/Allergies:  Bruises/bleeds easily.    PHYSICAL EXAM (per limitations of virtual telephone visit): The patient is alert and oriented x 3, exhibiting adequate mentation, good mood, and ability to speak in full sentences and execute sound judgement.   ASSESSMENT & PLAN: 1.   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  She also had thrombocytopenia during her pregnancies. - Ultrasound of abdomen (04/05/2016) shows normal spleen - Nutritional deficiency work-up was negative.  Hepatitis C antibody was positive, but hepatitis C RNA PCR was negative. - Her platelet count dropped to 21 on 11/26/2019, but improved to 112 after being treated with dexamethasone. - Most recent dexamethasone given on 04/26/2021 due to platelets 37 - She is not currently taking any medications. - Currently asymptomatic, reports easy bruising but no abnormal bleeding.  Previously, when her platelets are low she has noted gum bleeding, epistaxis, menorrhagia, and "little spots" on her hands. - Most recent labs (07/20/2021): Platelets 58.  Folate, methylmalonic acid, vitamin B12 normal. - PLAN: No indication for treatment at this time - RTC in 3 months with repeat CBC and LDH. - Patient will come back sooner if she develops any easy bruising or bleeding. - She has previously had good response with dexamethasone, which we would plan to use in the future if her platelet count drops below 30,000 or she has active bleeding.  I  discussed the assessment and treatment plan with the patient. The patient was provided an opportunity to ask questions and all were answered. The patient agreed with the plan and demonstrated an understanding of the instructions.    The patient was advised to call back or seek an in-person evaluation if the symptoms worsen or if the condition fails to improve as anticipated.  I provided 13 minutes of non-face-to-face time during this encounter.   Harriett Rush, PA-C 07/27/2021 2:26 PM

## 2021-10-26 ENCOUNTER — Other Ambulatory Visit (HOSPITAL_COMMUNITY): Payer: Medicaid Other

## 2021-10-26 ENCOUNTER — Inpatient Hospital Stay (HOSPITAL_COMMUNITY): Payer: Medicaid Other

## 2021-10-30 ENCOUNTER — Inpatient Hospital Stay (HOSPITAL_COMMUNITY): Payer: 59 | Attending: Hematology

## 2021-10-30 DIAGNOSIS — D693 Immune thrombocytopenic purpura: Secondary | ICD-10-CM | POA: Diagnosis present

## 2021-10-30 LAB — CBC WITH DIFFERENTIAL/PLATELET
Abs Immature Granulocytes: 0.02 10*3/uL (ref 0.00–0.07)
Basophils Absolute: 0.1 10*3/uL (ref 0.0–0.1)
Basophils Relative: 1 %
Eosinophils Absolute: 0.1 10*3/uL (ref 0.0–0.5)
Eosinophils Relative: 1 %
HCT: 44.1 % (ref 36.0–46.0)
Hemoglobin: 14.5 g/dL (ref 12.0–15.0)
Immature Granulocytes: 0 %
Lymphocytes Relative: 31 %
Lymphs Abs: 2.2 10*3/uL (ref 0.7–4.0)
MCH: 30.4 pg (ref 26.0–34.0)
MCHC: 32.9 g/dL (ref 30.0–36.0)
MCV: 92.5 fL (ref 80.0–100.0)
Monocytes Absolute: 0.4 10*3/uL (ref 0.1–1.0)
Monocytes Relative: 6 %
Neutro Abs: 4.4 10*3/uL (ref 1.7–7.7)
Neutrophils Relative %: 61 %
Platelets: 66 10*3/uL — ABNORMAL LOW (ref 150–400)
RBC: 4.77 MIL/uL (ref 3.87–5.11)
RDW: 12.4 % (ref 11.5–15.5)
WBC: 7.1 10*3/uL (ref 4.0–10.5)
nRBC: 0 % (ref 0.0–0.2)

## 2021-10-30 LAB — LACTATE DEHYDROGENASE: LDH: 127 U/L (ref 98–192)

## 2021-11-01 NOTE — Progress Notes (Signed)
? ?Virtual Visit via Telephone Note ?[facility] ? ?I connected with Caroline Brooks  on 11/02/21 at 1:24 PM by telephone and verified that I am speaking with the correct person using two identifiers. ? ?Location: ?Patient: Home ?Provider: [facility] ?  ?I discussed the limitations, risks, security and privacy concerns of performing an evaluation and management service by telephone and the availability of in person appointments. I also discussed with the patient that there may be a patient responsible charge related to this service. The patient expressed understanding and agreed to proceed. ? ?REASON FOR VISIT: Immune thrombocytopenia ? ?CURRENT THERAPY: As needed steroids ? ?INTERVAL HISTORY: ?Caroline Brooks is contacted today for follow-up of her chronic immune thrombocytopenia.  She was last evaluated via telemedicine visit by Rojelio Brenner PA-C on 07/27/2021. ?She reports that she "thinks her platelets are up" since she has noticed less bruising than usual.  She has not had any recent bleeding such as epistaxis, gum bleeding, GI bleeding, or hematuria.  She denies any menorrhagia.  She has not noticed any recent petechiae.  No mouth sores or wet purpura.  She denies any nausea or vomiting.  She does have occasional headaches that resolved with Tylenol.  No fever, chills, night sweats, or unintentional weight loss.  She has chronic fatigue which is at baseline. ? ?  ?OBSERVATIONS/OBJECTIVE: ?Review of Systems  ?Constitutional:  Positive for malaise/fatigue (chronic fatigue). Negative for chills, diaphoresis, fever and weight loss.  ?Respiratory:  Negative for cough and shortness of breath.   ?Cardiovascular:  Negative for chest pain and palpitations.  ?Gastrointestinal:  Negative for abdominal pain, blood in stool, melena, nausea and vomiting.  ?Neurological:  Positive for headaches (occasional). Negative for dizziness.   ? ?PHYSICAL EXAM (per limitations of virtual  telephone visit): The patient is alert and oriented x 3, exhibiting adequate mentation, good mood, and ability to speak in full sentences and execute sound judgement. ? ? ?ASSESSMENT & PLAN: ?1.   Immune mediated thrombocytopenia ?- Moderate to severe thrombocytopenia since 2016.  She also had thrombocytopenia during her pregnancies. ?- Ultrasound of abdomen (04/05/2016) shows normal spleen ?- Nutritional deficiency work-up was negative.  Hepatitis C antibody was positive, but hepatitis C RNA PCR was negative. ?- Her platelet count dropped to 21 on 11/26/2019, but improved to 112 after being treated with dexamethasone. ?- Most recent dexamethasone given on 04/26/2021 due to platelets 37 ?- She is not currently taking any medications.  ?-Bruising is currently improved.  No recent bleeding or petechiae. ?- Most recent labs (10/30/2021): Platelets 66, LDH normal.   ?- PLAN: No indication for treatment at this time ?- RTC in 3 months with repeat CBC and LDH.  (Due for annual OFFICE VISIT)   ?-- We will check B12 due to ongoing fatigue ?- Patient will come back sooner if she develops any easy bruising or bleeding. ?- She has previously had good response with dexamethasone, which we would plan to use in the future if her platelet count drops below 30,000 or she has active bleeding. ? ? ?FOLLOW UP INSTRUCTIONS: ?Labs in 3 months ?Office visit after labs ? ?  ?I discussed the assessment and treatment plan with the patient. The patient was provided an opportunity to ask questions and all were answered. The patient agreed with the plan and demonstrated an understanding of the instructions. ?  ?The patient was advised to call back or seek an in-person evaluation if the symptoms worsen or if  the condition fails to improve as anticipated. ? ?I provided 15 minutes of non-face-to-face time during this encounter. ? ? ?Carnella Guadalajara, PA-C ?11/02/2021 1:39 PM ?

## 2021-11-02 ENCOUNTER — Other Ambulatory Visit: Payer: Self-pay

## 2021-11-02 ENCOUNTER — Encounter (HOSPITAL_COMMUNITY): Payer: Self-pay | Admitting: Physician Assistant

## 2021-11-02 ENCOUNTER — Inpatient Hospital Stay (HOSPITAL_BASED_OUTPATIENT_CLINIC_OR_DEPARTMENT_OTHER): Payer: 59 | Admitting: Physician Assistant

## 2021-11-02 ENCOUNTER — Telehealth (HOSPITAL_COMMUNITY): Payer: Medicaid Other | Admitting: Physician Assistant

## 2021-11-02 DIAGNOSIS — D693 Immune thrombocytopenic purpura: Secondary | ICD-10-CM | POA: Diagnosis not present

## 2022-01-25 ENCOUNTER — Inpatient Hospital Stay: Payer: 59 | Attending: Physician Assistant

## 2022-01-25 DIAGNOSIS — D693 Immune thrombocytopenic purpura: Secondary | ICD-10-CM | POA: Diagnosis not present

## 2022-01-25 DIAGNOSIS — E611 Iron deficiency: Secondary | ICD-10-CM | POA: Insufficient documentation

## 2022-01-25 DIAGNOSIS — F5089 Other specified eating disorder: Secondary | ICD-10-CM | POA: Insufficient documentation

## 2022-01-25 LAB — CBC WITH DIFFERENTIAL/PLATELET
Abs Immature Granulocytes: 0.01 10*3/uL (ref 0.00–0.07)
Basophils Absolute: 0 10*3/uL (ref 0.0–0.1)
Basophils Relative: 1 %
Eosinophils Absolute: 0.1 10*3/uL (ref 0.0–0.5)
Eosinophils Relative: 1 %
HCT: 41.3 % (ref 36.0–46.0)
Hemoglobin: 14.1 g/dL (ref 12.0–15.0)
Immature Granulocytes: 0 %
Lymphocytes Relative: 30 %
Lymphs Abs: 1.8 10*3/uL (ref 0.7–4.0)
MCH: 31.1 pg (ref 26.0–34.0)
MCHC: 34.1 g/dL (ref 30.0–36.0)
MCV: 91.2 fL (ref 80.0–100.0)
Monocytes Absolute: 0.4 10*3/uL (ref 0.1–1.0)
Monocytes Relative: 7 %
Neutro Abs: 3.7 10*3/uL (ref 1.7–7.7)
Neutrophils Relative %: 61 %
Platelets: 45 10*3/uL — ABNORMAL LOW (ref 150–400)
RBC: 4.53 MIL/uL (ref 3.87–5.11)
RDW: 12.1 % (ref 11.5–15.5)
WBC: 6 10*3/uL (ref 4.0–10.5)
nRBC: 0 % (ref 0.0–0.2)

## 2022-01-25 LAB — FERRITIN: Ferritin: 26 ng/mL (ref 11–307)

## 2022-01-25 LAB — LACTATE DEHYDROGENASE: LDH: 124 U/L (ref 98–192)

## 2022-01-25 LAB — IRON AND TIBC
Iron: 81 ug/dL (ref 28–170)
Saturation Ratios: 23 % (ref 10.4–31.8)
TIBC: 350 ug/dL (ref 250–450)
UIBC: 269 ug/dL

## 2022-01-25 LAB — VITAMIN B12: Vitamin B-12: 574 pg/mL (ref 180–914)

## 2022-01-28 LAB — METHYLMALONIC ACID, SERUM: Methylmalonic Acid, Quantitative: 145 nmol/L (ref 0–378)

## 2022-01-31 NOTE — Progress Notes (Signed)
Virtual Visit via Telephone Note [facility]  I connected with Caroline Brooks  on 02/01/22 at 12:21 PM by telephone and verified that I am speaking with the correct person using two identifiers.  Location: Patient: Home Provider: [facility]   I discussed the limitations, risks, security and privacy concerns of performing an evaluation and management service by telephone and the availability of in person appointments. I also discussed with the patient that there may be a patient responsible charge related to this service. The patient expressed understanding and agreed to proceed.  REASON FOR VISIT: Immune thrombocytopenia  CURRENT THERAPY: Steroids as needed  INTERVAL HISTORY: Caroline Brooks is contacted today for follow-up of her chronic immune thrombocytopenia.  She was last evaluated via telemedicine visit by Rojelio Brenner PA-C on 11/02/2021.  She reports that her bruising is at baseline, and that her gum bleeding is better than it used to be.  She reports nosebleeds lasting <10 minutes that occur about once per month.  She continues to have menstrual cycles despite history of uterine ablation, with heavy bleeding lasting 2 to 3 days every 3 weeks.  She denies any recent petechiae, mouth sores, or wet purpura.  No signs of GI bleeding or hematuria.  She denies any nausea or vomiting.  She does have occasional headaches that resolved with Tylenol.   No fever, chills, night sweats, or unintentional weight loss.  Regarding her iron deficiency, she previously took iron tablets in the past which she tolerated well.  She reports severe fatigue as well as symptoms of pica and lightheadedness.  She has intermittent chest pain and shortness of breath, but these are associated with panic attacks and increased anxiety.    OBSERVATIONS/OBJECTIVE:   Review of Systems  Constitutional:  Positive for malaise/fatigue (chronic fatigue). Negative for chills,  diaphoresis, fever and weight loss.  Respiratory:  Positive for shortness of breath (with anxiety/panic attacks). Negative for cough.   Cardiovascular:  Positive for chest pain (with anxiety/panic attacks). Negative for palpitations.  Gastrointestinal:  Negative for abdominal pain, blood in stool, melena, nausea and vomiting.  Neurological:  Positive for dizziness and headaches (occasional).  Psychiatric/Behavioral:  The patient is nervous/anxious.      PHYSICAL EXAM (per limitations of virtual telephone visit): The patient is alert and oriented x 3, exhibiting adequate mentation, good mood, and ability to speak in full sentences and execute sound judgement.   ASSESSMENT & PLAN: 1.   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  She also had thrombocytopenia during her pregnancies. - Ultrasound of abdomen (04/05/2016) shows normal spleen - Nutritional deficiency work-up was negative.  Hepatitis C antibody was positive, but hepatitis C RNA PCR was negative. - Her platelet count dropped to 21 on 11/26/2019, but improved to 112 after being treated with dexamethasone. - Most recent dexamethasone given on 04/26/2021 due to platelets 37 - She is not currently taking any medications.  - Bruising is currently improved.  No recent bleeding or petechiae. - She reports menstrual cycles with heavy bleeding 2 to 3 days once every 3 weeks. - Most recent labs (01/25/2022): Platelets 45, otherwise normal CBC.  LDH normal at 124.  Normal vitamin B12, methylmalonic acid. - PLAN: No indication for treatment at this time - RTC in 3 months with repeat CBC and LDH.  (Due for annual OFFICE VISIT)   - Patient will come back sooner if she develops any easy bruising or bleeding. - She  has previously had good response with dexamethasone, which we would plan to use in the future if her platelet count drops below 30,000 or she has active bleeding.  2.  Iron deficiency without anemia -  Labs from 01/25/2022 show ferritin 26 with iron saturation 23%.  Hgb 14.1/MCV 91.2. - Menstrual cycles with heavy bleeding 2 to 3 days once every 3 weeks.  No GI bleeding or hematuria. - Symptomatic with pica and fatigue - Previously took iron tablets in the past, which she tolerated well - PLAN: Instructed to restart iron tablet with ferrous sulfate 325 mg daily. - Repeat iron panel at follow-up visit in 3 months.   FOLLOW UP INSTRUCTIONS: Same-day labs and office visit in 3 months.  I discussed the assessment and treatment plan with the patient. The patient was provided an opportunity to ask questions and all were answered. The patient agreed with the plan and demonstrated an understanding of the instructions.   The patient was advised to call back or seek an in-person evaluation if the symptoms worsen or if the condition fails to improve as anticipated.  I provided 22 minutes of non-face-to-face time during this encounter.   Carnella Guadalajara, PA-C 02/01/2022 12:47 PM

## 2022-02-01 ENCOUNTER — Inpatient Hospital Stay (HOSPITAL_BASED_OUTPATIENT_CLINIC_OR_DEPARTMENT_OTHER): Payer: 59 | Admitting: Physician Assistant

## 2022-02-01 DIAGNOSIS — D693 Immune thrombocytopenic purpura: Secondary | ICD-10-CM

## 2022-02-01 DIAGNOSIS — E611 Iron deficiency: Secondary | ICD-10-CM

## 2022-02-01 MED ORDER — FERROUS SULFATE 325 (65 FE) MG PO TBEC: 325.0000 mg | DELAYED_RELEASE_TABLET | Freq: Every day | ORAL | 1 refills | Status: AC

## 2022-02-26 ENCOUNTER — Ambulatory Visit (INDEPENDENT_AMBULATORY_CARE_PROVIDER_SITE_OTHER): Payer: 59 | Admitting: Internal Medicine

## 2022-02-26 ENCOUNTER — Encounter: Payer: Self-pay | Admitting: Internal Medicine

## 2022-02-26 VITALS — BP 113/89 | HR 68 | Temp 97.9°F | Ht 69.0 in | Wt 174.0 lb

## 2022-02-26 DIAGNOSIS — F17210 Nicotine dependence, cigarettes, uncomplicated: Secondary | ICD-10-CM | POA: Diagnosis not present

## 2022-02-26 DIAGNOSIS — F410 Panic disorder [episodic paroxysmal anxiety] without agoraphobia: Secondary | ICD-10-CM

## 2022-02-26 DIAGNOSIS — R5383 Other fatigue: Secondary | ICD-10-CM | POA: Diagnosis not present

## 2022-02-26 DIAGNOSIS — D509 Iron deficiency anemia, unspecified: Secondary | ICD-10-CM

## 2022-02-26 DIAGNOSIS — D696 Thrombocytopenia, unspecified: Secondary | ICD-10-CM

## 2022-02-26 DIAGNOSIS — F17200 Nicotine dependence, unspecified, uncomplicated: Secondary | ICD-10-CM

## 2022-02-26 DIAGNOSIS — R768 Other specified abnormal immunological findings in serum: Secondary | ICD-10-CM

## 2022-02-26 DIAGNOSIS — F419 Anxiety disorder, unspecified: Secondary | ICD-10-CM

## 2022-02-26 MED ORDER — HYDROXYZINE HCL 10 MG PO TABS: 10.0000 mg | ORAL_TABLET | Freq: Three times a day (TID) | ORAL | 3 refills | Status: DC | PRN

## 2022-02-26 NOTE — Assessment & Plan Note (Signed)
Unable to focus time on this today. Please follow-up at next visit.

## 2022-02-26 NOTE — Progress Notes (Addendum)
Subjective:  CC: establish care  HPI:  Ms.Caroline Brooks is a 31 y.o. female with a past medical history stated below and presents today to establish care. Please see problem based assessment and plan for additional details.  Past Medical History:  Diagnosis Date   Alcohol abuse    Menorrhagia with irregular cycle 08/23/2019   Narcotic abuse (HCC)    Thrombocytopenia (HCC) 07/07/2019   07/06/19 platelets 38 ck platelet profile    Current Outpatient Medications on File Prior to Visit  Medication Sig Dispense Refill   ferrous sulfate 325 (65 FE) MG EC tablet Take 1 tablet (325 mg total) by mouth daily with breakfast. 90 tablet 1   No current facility-administered medications on file prior to visit.    Family History  Problem Relation Age of Onset   Cancer Mother 27       breast    Hypertension Father    Diabetes Father    Other Brother        Print production planner 1   Cancer Maternal Grandfather        prostate   Hypertension Maternal Grandfather    Early death Paternal Grandfather    Alcohol abuse Paternal Grandfather    Healthy Daughter    Anemia Maternal Grandmother    Other Daughter        pulmonary stenosis   Healthy Son     Social History   Socioeconomic History   Marital status: Married    Spouse name: Not on file   Number of children: 3   Years of education: Not on file   Highest education level: Not on file  Occupational History   Occupation: umemployed  Tobacco Use   Smoking status: Some Days    Packs/day: 0.25    Years: 5.00    Total pack years: 1.25    Types: Cigarettes   Smokeless tobacco: Never  Vaping Use   Vaping Use: Never used  Substance and Sexual Activity   Alcohol use: Not Currently   Drug use: No   Sexual activity: Not Currently    Birth control/protection: Surgical  Other Topics Concern   Not on file  Social History Narrative   Not on file   Social Determinants of Health   Financial Resource Strain: Medium Risk (05/02/2020)    Overall Financial Resource Strain (CARDIA)    Difficulty of Paying Living Expenses: Somewhat hard  Food Insecurity: No Food Insecurity (05/02/2020)   Hunger Vital Sign    Worried About Running Out of Food in the Last Year: Never true    Ran Out of Food in the Last Year: Never true  Transportation Needs: No Transportation Needs (05/02/2020)   PRAPARE - Administrator, Civil Service (Medical): No    Lack of Transportation (Non-Medical): No  Physical Activity: Inactive (05/02/2020)   Exercise Vital Sign    Days of Exercise per Week: 0 days    Minutes of Exercise per Session: 0 min  Stress: No Stress Concern Present (05/02/2020)   [employer] of Occupational Health - Occupational Stress Questionnaire    Feeling of Stress : Not at all  Social Connections: Moderately Isolated (05/02/2020)   Social Connection and Isolation Panel [NHANES]    Frequency of Communication with Friends and Family: More than three times a week    Frequency of Social Gatherings with Friends and Family: Twice a week    Attends Religious Services: Never    Database administrator or Organizations: No  Attends Banker Meetings: Never    Marital Status: Married  Catering manager Violence: Not At Risk (05/02/2020)   Humiliation, Afraid, Rape, and Kick questionnaire    Fear of Current or Ex-Partner: No    Emotionally Abused: No    Physically Abused: No    Sexually Abused: No    Review of Systems: ROS negative except for what is noted on the assessment and plan.  Objective:   Vitals:   02/26/22 1541  BP: 113/89  Pulse: 68  Temp: 97.9 F (36.6 C)  TempSrc: Oral  SpO2: 100%  Weight: 174 lb (78.9 kg)  Height: 5\' 9"  (1.753 m)    Physical Exam: Constitutional: well-appearing, in no acute distress Neck: supple Cardiovascular: regular rate and rhythm, no m/r/g Pulmonary/Chest: normal work of breathing on room air, lungs clear to auscultation bilaterally Abdominal: soft, non-tender,  non-distended MSK: normal bulk and tone Neurological: alert & oriented x 3 Skin: warm and dry Psych: anxious, intermittently tearful     Assessment & Plan:  Fatigue Patient presents to establish care. She has noted fatigue for several years. Her past medical history includes ITP and iron deficiency anemia. She states that she has difficulty falling asleep, but often sleeps for 8-10 hours nightly. PHQ-9 elevated at 12 with GAD of 17. A: Main symptom of fatigue is feeling sleepy and not well rested. Differentials include hypothyroidism vs hyperthyroidism vs depression. Her last Hgb was within normal limits when checked at hematology in 8/23. P: -TSH -referral to [facility]   Thrombocytopenia [facility]) Patient presents with history of severe thrombocytopenia since 2016. Work-up showed normal spleen on 2017 2017, she was hepatitis C antibody +, but Hepeatitis C RNA PCR was negative. She follows closely with hematology, last seen 02/01/22. Most recent labs showed platelets 45, but otherwise normal CBC. LDH was normal at 124 with normal vitamin B12 and MMA.  A/P: Follow-up with hematology 11/23 to repeat CBC and LDH. Plan to use dexamethasone in the future if platelets were to drop below 30,000 or she has active bleeding.  Iron deficiency anemia Patient presents to establish care. She follows with hematology. Labs las completed 01/25/22 and showed ferritin 26 with iron saturation 23%. hGB OF 14.1/ mcv 91.2. She has menstrual cycles with heavy bleeding 2-3 days once every 3 weeks. When seen by hematology in August, she continued to be symptomatic with pica and fatigue. She started iron tablet at that time. 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  Tobacco use disorder Unable to focus time on this today. Please follow-up at next visit.  Panic  attack Patient has an acute complaint of panic attacks that have increased in frequency and intensity. She reports that her panic attacks started in her teens and she has previously tried Zoloft and Prozac but said she felt emotionally "numb" while taking them. She also reports having been prescribed Xanax, which she stopped after she became pregnant. She reports that she has not taken any other psychiatric medications since then and has been managing with meditation/yoga. She reports that the panic attacks have become more unmanageable recently and have become more frequent at 1x/week or 1x q2weeks. She reports her panic attacks consist of hyperventilation, chest tightness, claustrophobia, and feelings of "doom." Patient reports it has been difficult to handle panic attacks while keeping up with her responsibilities for her spouse and children.  She also reports difficulty falling asleep and sleeps up to 9-12 hours.  A&P: Patient's PHQ-9 was12 and GAD-7 was 17. We discussed restarting an SSRI but patient prefers a medication that she can take PRN, but stated she may be open to trying an SSRI in the future. We discussed starting a low dose of hydroxyzine PRN and patient was agreeable. Patient was also agreeable to starting CBT.  Plan: -Start Hydroxyzine 10mg  PRN -Provided patient with [facility] contact info -Follow up in 4 weeks  Addendum 9/7: Called patient to review TSH results. She stated that hydroxyzine has been helpful. During visit she was concerned about how medication would affect and wanted to try lowest dose. She states that she has been tolerating well and would like to try higher dose. -hydroxyzine 25 mg TID PRN   Patient discussed with Dr. 11/7 Ludene Stokke, D.O. [facility]  PGY-2 Pager: [phone]  Phone: [phone] Date 02/28/2022  Time 1:16 PM

## 2022-02-26 NOTE — Patient Instructions (Addendum)
Thank you, CarolineAngeleigh T Brooks for allowing [place] to provide your care today.   Thank you for seeing [place] today Caroline Brooks! Today we discussed panic attacks and fatigue.   Medications: Start Hydroxyzine 10mg , use as needed for panic attacks up to 3x/day.   Referrals: We will refer you to [facility] for therapy, and you should receive a call to set up an appointment.   [facility] [phone]  Labwork We are looking at your thyroid function today and we will call you with the results.   Please give [phone] a call if you have any questions.   I have ordered the following labs for you:  Lab Orders         TSH       Referrals ordered today:   Referral Orders  No referral(s) requested today     I have ordered the following medication/changed the following medications:   Stop the following medications: There are no discontinued medications.   Start the following medications: Meds ordered this encounter  Medications   hydrOXYzine (ATARAX) 10 MG tablet    Sig: Take 1 tablet (10 mg total) by mouth 3 (three) times daily as needed.    Dispense:  30 tablet    Refill:  3     Follow up: 4 weeks   We look forward to seeing you next time. Please call our clinic at [phone] if you have any questions or concerns. The best time to call is Monday-Friday from 9am-4pm, but there is someone available 24/7. If after hours or the weekend, call the main hospital number and ask for the Internal Medicine Resident On-Call. If you need medication refills, please notify your pharmacy one week in advance and they will send [phone] a request.   Thank you for trusting me with your care. Wishing you the best!   [place], DO [facility]

## 2022-02-26 NOTE — Assessment & Plan Note (Addendum)
Patient presents to establish care. She has noted fatigue for several years. Her past medical history includes ITP and iron deficiency anemia. She states that she has difficulty falling asleep, but often sleeps for 8-10 hours nightly. PHQ-9 elevated at 12 with GAD of 17. A: Main symptom of fatigue is feeling sleepy and not well rested. Differentials include hypothyroidism vs hyperthyroidism vs depression. Her last Hgb was within normal limits when checked at hematology in 8/23. P: -TSH -referral to [facility]

## 2022-02-26 NOTE — Assessment & Plan Note (Signed)
Patient presents with history of severe thrombocytopenia since 2016. Work-up showed normal spleen on [place] 2017, she was hepatitis C antibody +, but Hepeatitis C RNA PCR was negative. She follows closely with hematology, last seen 02/01/22. Most recent labs showed platelets 45, but otherwise normal CBC. LDH was normal at 124 with normal vitamin B12 and MMA.  A/P: Follow-up with hematology 11/23 to repeat CBC and LDH. Plan to use dexamethasone in the future if platelets were to drop below 30,000 or she has active bleeding.

## 2022-02-26 NOTE — Assessment & Plan Note (Signed)
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

## 2022-02-26 NOTE — Assessment & Plan Note (Signed)
Patient presents to establish care. She follows with hematology. Labs las completed 01/25/22 and showed ferritin 26 with iron saturation 23%. hGB OF 14.1/ mcv 91.2. She has menstrual cycles with heavy bleeding 2-3 days once every 3 weeks. When seen by hematology in August, she continued to be symptomatic with pica and fatigue. She started iron tablet at that time. A/P: Repeat CBC with hematology 11/23

## 2022-02-26 NOTE — Assessment & Plan Note (Addendum)
Patient has an acute complaint of panic attacks that have increased in frequency and intensity. She reports that her panic attacks started in her teens and she has previously tried Zoloft and Prozac but said she felt emotionally "numb" while taking them. She also reports having been prescribed Xanax, which she stopped after she became pregnant. She reports that she has not taken any other psychiatric medications since then and has been managing with meditation/yoga. She reports that the panic attacks have become more unmanageable recently and have become more frequent at 1x/week or 1x q2weeks. She reports her panic attacks consist of hyperventilation, chest tightness, claustrophobia, and feelings of "doom." Patient reports it has been difficult to handle panic attacks while keeping up with her responsibilities for her spouse and children. She also reports difficulty falling asleep and sleeps up to 9-12 hours.  A&P: Patient's PHQ-9 was12 and GAD-7 was 17. We discussed restarting an SSRI but patient prefers a medication that she can take PRN, but stated she may be open to trying an SSRI in the future. We discussed starting a low dose of hydroxyzine PRN and patient was agreeable. Patient was also agreeable to starting CBT.  Plan: -Start Hydroxyzine 10mg  PRN -Provided patient with [facility] contact info -Follow up in 4 weeks  Addendum 9/7: Called patient to review TSH results. She stated that hydroxyzine has been helpful. During visit she was concerned about how medication would affect and wanted to try lowest dose. She states that she has been tolerating well and would like to try higher dose. -hydroxyzine 25 mg TID PRN

## 2022-02-27 LAB — TSH: TSH: 1.98 u[IU]/mL (ref 0.450–4.500)

## 2022-02-27 NOTE — [facility] Notes (Signed)
Internal Medicine Clinic Attending  Case discussed with Dr. Masters  At the time of the visit.  We reviewed the resident's history and exam and pertinent patient test results.  I agree with the assessment, diagnosis, and plan of care documented in the resident's note.  

## 2022-02-28 ENCOUNTER — Telehealth: Payer: Self-pay | Admitting: Internal Medicine

## 2022-02-28 MED ORDER — HYDROXYZINE HCL 25 MG PO TABS: 25.0000 mg | ORAL_TABLET | Freq: Three times a day (TID) | ORAL | 0 refills | Status: AC | PRN

## 2022-02-28 NOTE — Addendum Note (Signed)
Addended by: Lucille Passy on: 02/28/2022 01:17 PM   Modules accepted: Orders

## 2022-02-28 NOTE — Telephone Encounter (Signed)
Pt rtn call about her lab results. Pt states she missed a call this morning.

## 2022-03-06 DIAGNOSIS — F411 Generalized anxiety disorder: Secondary | ICD-10-CM | POA: Diagnosis not present

## 2022-04-30 NOTE — [facility] Notes (Unsigned)
[facility] [address], [place] [address]   CLINIC:  Medical Oncology/Hematology  PCP:  [facility] [address] [place] [address] [phone]   REASON FOR VISIT:  Follow-up for immune thrombocytopenia  CURRENT THERAPY: Steroids as needed  INTERVAL HISTORY:  [name] 31 y.o. female returns for routine follow-up of her chronic immune thrombocytopenia and iron deficiency.  She was last evaluated via telemedicine visit by Rojelio Brenner PA-C on 02/01/2022.  At today's visit, she reports feeling well.  No recent hospitalizations, surgeries, or changes in baseline health status.  She reports easy bruising, but denies any spontaneous bruises, wet purpura, or petechial rash.  No recent gum bleeding. She has not had a nosebleed for the past 2 months.  She continues to have menstrual cycles, which are irregular and at times heavy - she did have uterine ablation in the past.  No signs of GI bleeding or hematuria, but her stool has been dark after starting iron supplement.   She denies any nausea or vomiting.  She does have occasional headaches that resolved with Tylenol.  No fever, chills, night sweats, or unintentional weight loss.  She has been taking iron tablets (ferrous sulfate 325 mg daily) since her last phone visit on 02/01/2022. She is tolerating these well.  She continues to have moderate to severe fatigue and occasional ice pica.  She reports lightheadedness and headaches.  She has intermittent chest pain and shortness of breath, but these are associated with panic attacks and increased anxiety.   She has 40% energy and 100% appetite. She endorses that she is maintaining a stable weight.   REVIEW OF SYSTEMS:  Review of Systems  Constitutional:  Positive for fatigue. Negative for appetite change, chills, diaphoresis, fever and unexpected weight change.  HENT:   Negative for lump/mass and nosebleeds.   Eyes:  Negative for  eye problems.  Respiratory:  Negative for cough, hemoptysis and shortness of breath.   Cardiovascular:  Negative for chest pain, leg swelling and palpitations.  Gastrointestinal:  Positive for nausea. Negative for abdominal pain, blood in stool, constipation, diarrhea and vomiting.  Genitourinary:  Negative for hematuria.   Skin: Negative.   Neurological:  Positive for dizziness, headaches and numbness (Left leg numbness and pain). Negative for light-headedness.  Hematological:  Does not bruise/bleed easily.      PAST MEDICAL/SURGICAL HISTORY:  Past Medical History:  Diagnosis Date   Alcohol abuse    Menorrhagia with irregular cycle 08/23/2019   Narcotic abuse (HCC)    Thrombocytopenia (HCC) 07/07/2019   07/06/19 platelets 38 ck platelet profile   Past Surgical History:  Procedure Laterality Date   BILATERAL SALPINGECTOMY Bilateral 11/18/2014   Procedure: BILATERAL SALPINGECTOMY;  Surgeon: Lazaro Arms, MD;  Location: [facility];  Service: Obstetrics;  Laterality: Bilateral;   CESAREAN SECTION  2011   CESAREAN SECTION  11/26/2011   Procedure: CESAREAN SECTION;  Surgeon: Lazaro Arms, MD;  Location: [facility];  Service: Gynecology;  Laterality: N/A;   CESAREAN SECTION N/A 11/18/2014   Procedure: CESAREAN SECTION;  Surgeon: Lazaro Arms, MD;  Location: [facility];  Service: Obstetrics;  Laterality: N/A;   DILATATION AND CURETTAGE/HYSTEROSCOPY WITH MINERVA N/A 11/03/2019   Procedure: DILATATION AND CURETTAGE/HYSTEROSCOPY WITH MINERVA;  Surgeon: Lazaro Arms, MD;  Location: [facility];  Service: Gynecology;  Laterality: N/A;     SOCIAL HISTORY:  Social History   Socioeconomic History   Marital status: Married  Spouse name: Not on file   Number of children: 3   Years of education: Not on file   Highest education level: Not on file  Occupational History   Occupation: umemployed  Tobacco Use   Smoking status: Some Days    Packs/day: 0.25    Years: 5.00    Total pack years: 1.25    Types:  Cigarettes   Smokeless tobacco: Never  Vaping Use   Vaping Use: Never used  Substance and Sexual Activity   Alcohol use: Not Currently   Drug use: No   Sexual activity: Not Currently    Birth control/protection: Surgical  Other Topics Concern   Not on file  Social History Narrative   Not on file   Social Determinants of Health   Financial Resource Strain: Medium Risk (05/02/2020)   Overall Financial Resource Strain (CARDIA)    Difficulty of Paying Living Expenses: Somewhat hard  Food Insecurity: No Food Insecurity (05/02/2020)   Hunger Vital Sign    Worried About Running Out of Food in the Last Year: Never true    Ran Out of Food in the Last Year: Never true  Transportation Needs: No Transportation Needs (05/02/2020)   PRAPARE - Hydrologist (Medical): No    Lack of Transportation (Non-Medical): No  Physical Activity: Inactive (05/02/2020)   Exercise Vital Sign    Days of Exercise per Week: 0 days    Minutes of Exercise per Session: 0 min  Stress: No Stress Concern Present (05/02/2020)   [place]    Feeling of Stress : Not at all  Social Connections: Moderately Isolated (05/02/2020)   Social Connection and Isolation Panel [NHANES]    Frequency of Communication with Friends and Family: More than three times a week    Frequency of Social Gatherings with Friends and Family: Twice a week    Attends Religious Services: Never    Marine scientist or Organizations: No    Attends Archivist Meetings: Never    Marital Status: Married  Human resources officer Violence: Not At Risk (05/02/2020)   Humiliation, Afraid, Rape, and Kick questionnaire    Fear of Current or Ex-Partner: No    Emotionally Abused: No    Physically Abused: No    Sexually Abused: No    FAMILY HISTORY:  Family History  Problem Relation Age of Onset   Cancer Mother 37       breast    Hypertension Father     Diabetes Father    Other Brother        International aid/development worker 1   Cancer Maternal Grandfather        prostate   Hypertension Maternal Grandfather    Early death Paternal Grandfather    Alcohol abuse Paternal Grandfather    Healthy Daughter    Anemia Maternal Grandmother    Other Daughter        pulmonary stenosis   Healthy Son     CURRENT MEDICATIONS:  Outpatient Encounter Medications as of 05/01/2022  Medication Sig   ferrous sulfate 325 (65 FE) MG EC tablet Take 1 tablet (325 mg total) by mouth daily with breakfast.   hydrOXYzine (ATARAX) 25 MG tablet Take 1 tablet (25 mg total) by mouth every 8 (eight) hours as needed.   No facility-administered encounter medications on file as of 05/01/2022.    ALLERGIES:  No Known Allergies   PHYSICAL EXAM:  ECOG PERFORMANCE STATUS: 1 -  Symptomatic but completely ambulatory  There were no vitals filed for this visit. There were no vitals filed for this visit. Physical Exam Constitutional:      Appearance: Normal appearance.  HENT:     Head: Normocephalic and atraumatic.     Mouth/Throat:     Mouth: Mucous membranes are moist.  Eyes:     Extraocular Movements: Extraocular movements intact.     Pupils: Pupils are equal, round, and reactive to light.  Cardiovascular:     Rate and Rhythm: Normal rate and regular rhythm.     Pulses: Normal pulses.     Heart sounds: Normal heart sounds.  Pulmonary:     Effort: Pulmonary effort is normal.     Breath sounds: Normal breath sounds.  Abdominal:     General: Bowel sounds are normal.     Palpations: Abdomen is soft.     Tenderness: There is no abdominal tenderness.  Musculoskeletal:        General: No swelling.     Right lower leg: No edema.     Left lower leg: No edema.  Lymphadenopathy:     Cervical: No cervical adenopathy.  Skin:    General: Skin is warm and dry.  Neurological:     General: No focal deficit present.     Mental Status: She is alert and oriented to person, place, and time.   Psychiatric:        Mood and Affect: Mood normal.        Behavior: Behavior normal.      LABORATORY DATA:  I have reviewed the labs as listed.  CBC    Component Value Date/Time   WBC 6.0 01/25/2022 1041   RBC 4.53 01/25/2022 1041   HGB 14.1 01/25/2022 1041   HGB 14.2 08/23/2019 1117   HCT 41.3 01/25/2022 1041   HCT 41.5 08/23/2019 1117   PLT 45 (L) 01/25/2022 1041   PLT 49 (LL) 08/23/2019 1117   MCV 91.2 01/25/2022 1041   MCV 89 08/23/2019 1117   MCH 31.1 01/25/2022 1041   MCHC 34.1 01/25/2022 1041   RDW 12.1 01/25/2022 1041   RDW 12.4 08/23/2019 1117   LYMPHSABS 1.8 01/25/2022 1041   LYMPHSABS 2.9 07/05/2019 1226   MONOABS 0.4 01/25/2022 1041   EOSABS 0.1 01/25/2022 1041   EOSABS 0.0 07/05/2019 1226   BASOSABS 0.0 01/25/2022 1041   BASOSABS 0.1 07/05/2019 1226      Latest Ref Rng & Units 01/31/2020    2:12 PM 11/26/2019    9:01 AM 11/01/2019    2:05 PM  CMP  Glucose 70 - 99 mg/dL 865  784  696   BUN 6 - 20 mg/dL 8  7  14    Creatinine 0.44 - 1.00 mg/dL  2.95  2.84   Sodium 135 - 145 mmol/L 139  138  133   Potassium 3.5 - 5.1 mmol/L 4.2  4.9  4.0   Chloride 98 - 111 mmol/L 103  102  101   CO2 22 - 32 mmol/L 28  28  24    Calcium 8.9 - 10.3 mg/dL 9.1  9.2  9.2   Total Protein 6.5 - 8.1 g/dL 7.2  7.1  7.2   Total Bilirubin 0.3 - 1.2 mg/dL 0.4  0.5  0.5   Alkaline Phos 38 - 126 U/L 73  65  63   AST 15 - 41 U/L 15  17  18    ALT 0 - 44 U/L 12  17  19     DIAGNOSTIC IMAGING:  I have independently reviewed the relevant imaging and discussed with the patient.  ASSESSMENT & PLAN: 1.   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  She also had thrombocytopenia during her pregnancies. - Ultrasound of abdomen (04/05/2016) shows normal spleen - Nutritional deficiency work-up was negative.  Hepatitis C antibody was positive, but hepatitis C RNA PCR was negative. - Her platelet count dropped to 21 on 11/26/2019, but improved to 112  after being treated with dexamethasone. - Most recent dexamethasone given on 04/26/2021 due to platelets 37 - She is not currently taking any medications apart from iron supplement. - She bruises easily but denies any spontaneous bruising or petechiae. - She reports menstrual cycles with heavy bleeding 2 to 3 days once every 3 weeks. - Most recent CBC (05/01/2022): Platelets 62.  LDH normal.  Normal vitamin B12, methylmalonic acid when checked in August 2023. - PLAN: No indication for treatment at this time - Labs in 3 months with repeat CBC and LDH.  Phone visit 1 week after labs. - Patient will come back sooner if she develops any easy bruising or bleeding. - She has previously had good response with dexamethasone, which we would plan to use in the future if her platelet count drops below 30,000 or she has active bleeding.   2.  Iron deficiency without anemia - Labs from 01/25/2022 showed ferritin 26 with iron saturation 23%.  Hgb 14.1/MCV 91.2. - Menstrual cycles with heavy bleeding 2 to 3 days once every 3 weeks.  No GI bleeding or hematuria. - She restarted daily iron tablet (ferrous sulfate 325 mg) in August 2023, but without any improvement in her iron levels - Symptomatic with pica and fatigue - Labs today (05/01/2022): Hgb 13.5/MCV 94.0, ferritin 24, iron saturation 20%. - PLAN: Recommend IV Venofer 300 mg x 3 due to symptomatic iron deficiency with ferritin <100 despite oral iron supplementation.  We discussed potential side effects of IV iron, including the rare but serious side effect of allergic reaction.  Patient is agreeable to proceed. - Continue taking ferrous sulfate 325 mg daily.   - Repeat iron panel at follow-up visit in 3 months.     FOLLOW UP INSTRUCTIONS: >> IV Venofer 300 mg x 3 doses >> Labs in 3 months (CBC/D, LDH, ferritin, iron/TIBC) >> Phone visit 1 week after labs   All questions were answered. The patient knows to call the clinic with any problems, questions or  concerns.  Medical decision making: Moderate  Time spent on visit: I spent 20 minutes counseling the patient face to face. The total time spent in the appointment was 30 minutes and more than 50% was on counseling.   Carnella Guadalajara, PA-C  05/01/2022 11:18 AM

## 2022-05-01 ENCOUNTER — Inpatient Hospital Stay: Payer: 59 | Attending: Physician Assistant

## 2022-05-01 ENCOUNTER — Inpatient Hospital Stay (HOSPITAL_BASED_OUTPATIENT_CLINIC_OR_DEPARTMENT_OTHER): Payer: 59 | Admitting: Physician Assistant

## 2022-05-01 VITALS — BP 113/67 | HR 83 | Temp 98.0°F | Resp 16 | Wt 168.4 lb

## 2022-05-01 DIAGNOSIS — E611 Iron deficiency: Secondary | ICD-10-CM | POA: Insufficient documentation

## 2022-05-01 DIAGNOSIS — R42 Dizziness and giddiness: Secondary | ICD-10-CM | POA: Insufficient documentation

## 2022-05-01 DIAGNOSIS — R5383 Other fatigue: Secondary | ICD-10-CM | POA: Diagnosis not present

## 2022-05-01 DIAGNOSIS — F17210 Nicotine dependence, cigarettes, uncomplicated: Secondary | ICD-10-CM | POA: Insufficient documentation

## 2022-05-01 DIAGNOSIS — D693 Immune thrombocytopenic purpura: Secondary | ICD-10-CM | POA: Diagnosis not present

## 2022-05-01 DIAGNOSIS — R519 Headache, unspecified: Secondary | ICD-10-CM | POA: Insufficient documentation

## 2022-05-01 LAB — IRON AND TIBC
Iron: 68 ug/dL (ref 28–170)
Saturation Ratios: 20 % (ref 10.4–31.8)
TIBC: 348 ug/dL (ref 250–450)
UIBC: 280 ug/dL

## 2022-05-01 LAB — CBC WITH DIFFERENTIAL/PLATELET
Abs Immature Granulocytes: 0.02 10*3/uL (ref 0.00–0.07)
Basophils Absolute: 0 10*3/uL (ref 0.0–0.1)
Basophils Relative: 1 %
Eosinophils Absolute: 0.1 10*3/uL (ref 0.0–0.5)
Eosinophils Relative: 2 %
HCT: 41 % (ref 36.0–46.0)
Hemoglobin: 13.5 g/dL (ref 12.0–15.0)
Immature Granulocytes: 0 %
Lymphocytes Relative: 30 %
Lymphs Abs: 1.8 10*3/uL (ref 0.7–4.0)
MCH: 31 pg (ref 26.0–34.0)
MCHC: 32.9 g/dL (ref 30.0–36.0)
MCV: 94 fL (ref 80.0–100.0)
Monocytes Absolute: 0.4 10*3/uL (ref 0.1–1.0)
Monocytes Relative: 7 %
Neutro Abs: 3.8 10*3/uL (ref 1.7–7.7)
Neutrophils Relative %: 60 %
Platelets: 62 10*3/uL — ABNORMAL LOW (ref 150–400)
RBC: 4.36 MIL/uL (ref 3.87–5.11)
RDW: 13.3 % (ref 11.5–15.5)
WBC: 6.2 10*3/uL (ref 4.0–10.5)
nRBC: 0 % (ref 0.0–0.2)

## 2022-05-01 LAB — LACTATE DEHYDROGENASE: LDH: 125 U/L (ref 98–192)

## 2022-05-01 LAB — FERRITIN: Ferritin: 24 ng/mL (ref 11–307)

## 2022-05-01 NOTE — Patient Instructions (Signed)
[facility] at [facility] **VISIT SUMMARY & IMPORTANT INSTRUCTIONS **   You were seen today by Rojelio Brenner PA-C for your follow-up visit.    ITP Your platelets today were 62, which is more or less her baseline. You do not need any treatment of ITP at this time. Let us know if you have any spontaneous bruising or abnormal bleeding, which could be a sign of significant drop in your platelets. We will recheck your labs again in 3 months.  IRON DEFICIENCY Your iron levels are low despite taking iron pills for the past 3 months. This is most likely related to menstrual blood loss, which deplete your body's iron stores. Your low iron may be causing some of your symptoms of fatigue. I recommend IV iron x3 doses.  Please see the attached handout for further information regarding this medication and potential side effects. You can continue to take iron tablet daily to see if this will decrease how frequently you would need IV iron infusions in the future.  LEFT LEG PAIN & NUMBNESS This could be a sign of "lumbar radiculopathy," which is when the nerves that come out of your low back and go to your leg are compressed, which causes pain, numbness, and other symptoms.  Please speak with your primary care doctor regarding the symptoms, as they may need to run some other tests.  LABS: Return in 3 months for repeat labs  FOLLOW-UP APPOINTMENT: Phone visit in 3 months, 1 week after labs  ** Thank you for trusting me with your healthcare!  I strive to provide all of my patients with quality care at each visit.  If you receive a survey for this visit, I would be so grateful to you for taking the time to provide feedback.  Thank you in advance!  ~ Micai Apolinar                   Dr. Doreatha Massed   &   Rojelio Brenner, PA-C   - - - - - - - - - - - - - - - - - -    Thank you for choosing [facility] at [facility] to provide your oncology and  hematology care.  To afford each patient quality time with our provider, please arrive at least 15 minutes before your scheduled appointment time.   If you have a lab appointment with the Cancer Center please come in thru the Main Entrance and check in at the main information desk.  You need to re-schedule your appointment should you arrive 10 or more minutes late.  We strive to give you quality time with our providers, and arriving late affects you and other patients whose appointments are after yours.  Also, if you no show three or more times for appointments you may be dismissed from the clinic at the providers discretion.     Again, thank you for choosing [facility].  Our hope is that these requests will decrease the amount of time that you wait before being seen by our physicians.       _____________________________________________________________  Should you have questions after your visit to [facility], please contact our office at [phone] and follow the prompts.  Our office hours are 8:00 a.m. and 4:30 p.m. Monday - Friday.  Please note that voicemails left after 4:00 p.m. may not be returned until the following business day.  We are closed weekends and  major holidays.  You do have access to a nurse 24-7, just call the main number to the clinic [phone] and do not press any options, hold on the line and a nurse will answer the phone.    For prescription refill requests, have your pharmacy contact our office and allow 72 hours.

## 2022-05-03 ENCOUNTER — Inpatient Hospital Stay: Payer: 59

## 2022-05-13 ENCOUNTER — Inpatient Hospital Stay: Payer: 59

## 2022-05-13 VITALS — BP 116/67 | HR 71 | Temp 97.9°F | Resp 18

## 2022-05-13 DIAGNOSIS — D500 Iron deficiency anemia secondary to blood loss (chronic): Secondary | ICD-10-CM

## 2022-05-13 DIAGNOSIS — D693 Immune thrombocytopenic purpura: Secondary | ICD-10-CM | POA: Diagnosis not present

## 2022-05-13 MED ORDER — ACETAMINOPHEN 325 MG PO TABS
650.0000 mg | ORAL_TABLET | Freq: Once | ORAL | Status: AC
  Administered 2022-05-13: 650 mg via ORAL
  Filled 2022-05-13: qty 2

## 2022-05-13 MED ORDER — SODIUM CHLORIDE 0.9 % IV SOLN: Freq: Once | INTRAVENOUS | Status: AC

## 2022-05-13 MED ORDER — LORATADINE 10 MG PO TABS
10.0000 mg | ORAL_TABLET | Freq: Once | ORAL | Status: AC
  Administered 2022-05-13: 10 mg via ORAL
  Filled 2022-05-13: qty 1

## 2022-05-13 MED ORDER — SODIUM CHLORIDE 0.9 % IV SOLN
300.0000 mg | Freq: Once | INTRAVENOUS | Status: AC
  Administered 2022-05-13: 300 mg via INTRAVENOUS
  Filled 2022-05-13: qty 300

## 2022-05-13 NOTE — [facility] Notes (Signed)
Patient tolerated iron infusion with no complaints voiced.  Peripheral IV site clean and dry with good blood return noted before and after infusion.  Band aid applied.  VSS with discharge and left in satisfactory condition with no s/s of distress noted.   

## 2022-05-13 NOTE — Patient Instructions (Signed)
[facility]  Discharge Instructions: Thank you for choosing [facility] to provide your oncology and hematology care.  If you have a lab appointment with the Cancer Center, please come in thru the Main Entrance and check in at the main information desk.  Wear comfortable clothing and clothing appropriate for easy access to any Portacath or PICC line.   We strive to give you quality time with your provider. You may need to reschedule your appointment if you arrive late (15 or more minutes).  Arriving late affects you and other patients whose appointments are after yours.  Also, if you miss three or more appointments without notifying the office, you may be dismissed from the clinic at the provider's discretion.      For prescription refill requests, have your pharmacy contact our office and allow 72 hours for refills to be completed.    Today you received the following Venofer, return as scheduled.   To help prevent nausea and vomiting after your treatment, we encourage you to take your nausea medication as directed.  BELOW ARE SYMPTOMS THAT SHOULD BE REPORTED IMMEDIATELY: *FEVER GREATER THAN 100.4 F (38 C) OR HIGHER *CHILLS OR SWEATING *NAUSEA AND VOMITING THAT IS NOT CONTROLLED WITH YOUR NAUSEA MEDICATION *UNUSUAL SHORTNESS OF BREATH *UNUSUAL BRUISING OR BLEEDING *URINARY PROBLEMS (pain or burning when urinating, or frequent urination) *BOWEL PROBLEMS (unusual diarrhea, constipation, pain near the anus) TENDERNESS IN MOUTH AND THROAT WITH OR WITHOUT PRESENCE OF ULCERS (sore throat, sores in mouth, or a toothache) UNUSUAL RASH, SWELLING OR PAIN  UNUSUAL VAGINAL DISCHARGE OR ITCHING   Items with * indicate a potential emergency and should be followed up as soon as possible or go to the Emergency Department if any problems should occur.  Please show the CHEMOTHERAPY ALERT CARD or IMMUNOTHERAPY ALERT CARD at check-in to the Emergency Department and triage  nurse.  Should you have questions after your visit or need to cancel or reschedule your appointment, please contact [facility] [phone]  and follow the prompts.  Office hours are 8:00 a.m. to 4:30 p.m. Monday - Friday. Please note that voicemails left after 4:00 p.m. may not be returned until the following business day.  We are closed weekends and major holidays. You have access to a nurse at all times for urgent questions. Please call the main number to the clinic [phone] and follow the prompts.  For any non-urgent questions, you may also contact your provider using MyChart. We now offer e-Visits for anyone 18 and older to request care online for non-urgent symptoms. For details visit [email or web address].   Also download the MyChart app! Go to the app store, search "MyChart", open the app, select [facility], and log in with your MyChart username and password.  Masks are optional in the cancer centers. If you would like for your care team to wear a mask while they are taking care of you, please let them know. You may have one support person who is at least 32 years old accompany you for your appointments.  

## 2022-05-23 ENCOUNTER — Inpatient Hospital Stay: Payer: 59

## 2022-05-23 VITALS — BP 93/62 | HR 64 | Temp 97.6°F | Resp 18

## 2022-05-23 DIAGNOSIS — D693 Immune thrombocytopenic purpura: Secondary | ICD-10-CM | POA: Diagnosis not present

## 2022-05-23 DIAGNOSIS — D500 Iron deficiency anemia secondary to blood loss (chronic): Secondary | ICD-10-CM

## 2022-05-23 MED ORDER — LORATADINE 10 MG PO TABS
10.0000 mg | ORAL_TABLET | Freq: Once | ORAL | Status: AC
  Administered 2022-05-23: 10 mg via ORAL
  Filled 2022-05-23: qty 1

## 2022-05-23 MED ORDER — SODIUM CHLORIDE 0.9 % IV SOLN
300.0000 mg | Freq: Once | INTRAVENOUS | Status: AC
  Administered 2022-05-23: 300 mg via INTRAVENOUS
  Filled 2022-05-23: qty 300

## 2022-05-23 MED ORDER — SODIUM CHLORIDE 0.9 % IV SOLN: Freq: Once | INTRAVENOUS | Status: AC

## 2022-05-23 MED ORDER — ACETAMINOPHEN 325 MG PO TABS
650.0000 mg | ORAL_TABLET | Freq: Once | ORAL | Status: AC
  Administered 2022-05-23: 650 mg via ORAL
  Filled 2022-05-23: qty 2

## 2022-05-23 NOTE — Patient Instructions (Signed)
[facility]  Discharge Instructions: Thank you for choosing [facility] to provide your oncology and hematology care.  If you have a lab appointment with the Cancer Center, please come in thru the Main Entrance and check in at the main information desk.  Wear comfortable clothing and clothing appropriate for easy access to any Portacath or PICC line.   We strive to give you quality time with your provider. You may need to reschedule your appointment if you arrive late (15 or more minutes).  Arriving late affects you and other patients whose appointments are after yours.  Also, if you miss three or more appointments without notifying the office, you may be dismissed from the clinic at the provider's discretion.      For prescription refill requests, have your pharmacy contact our office and allow 72 hours for refills to be completed.    Today you received the following Venofer, return as scheduled.   To help prevent nausea and vomiting after your treatment, we encourage you to take your nausea medication as directed.  BELOW ARE SYMPTOMS THAT SHOULD BE REPORTED IMMEDIATELY: *FEVER GREATER THAN 100.4 F (38 C) OR HIGHER *CHILLS OR SWEATING *NAUSEA AND VOMITING THAT IS NOT CONTROLLED WITH YOUR NAUSEA MEDICATION *UNUSUAL SHORTNESS OF BREATH *UNUSUAL BRUISING OR BLEEDING *URINARY PROBLEMS (pain or burning when urinating, or frequent urination) *BOWEL PROBLEMS (unusual diarrhea, constipation, pain near the anus) TENDERNESS IN MOUTH AND THROAT WITH OR WITHOUT PRESENCE OF ULCERS (sore throat, sores in mouth, or a toothache) UNUSUAL RASH, SWELLING OR PAIN  UNUSUAL VAGINAL DISCHARGE OR ITCHING   Items with * indicate a potential emergency and should be followed up as soon as possible or go to the Emergency Department if any problems should occur.  Please show the CHEMOTHERAPY ALERT CARD or IMMUNOTHERAPY ALERT CARD at check-in to the Emergency Department and triage  nurse.  Should you have questions after your visit or need to cancel or reschedule your appointment, please contact [facility] [phone]  and follow the prompts.  Office hours are 8:00 a.m. to 4:30 p.m. Monday - Friday. Please note that voicemails left after 4:00 p.m. may not be returned until the following business day.  We are closed weekends and major holidays. You have access to a nurse at all times for urgent questions. Please call the main number to the clinic [phone] and follow the prompts.  For any non-urgent questions, you may also contact your provider using MyChart. We now offer e-Visits for anyone 18 and older to request care online for non-urgent symptoms. For details visit [email or web address].   Also download the MyChart app! Go to the app store, search "MyChart", open the app, select , and log in with your MyChart username and password.  Masks are optional in the cancer centers. If you would like for your care team to wear a mask while they are taking care of you, please let them know. You may have one support person who is at least 32 years old accompany you for your appointments.  

## 2022-05-23 NOTE — [facility] Notes (Signed)
Patient tolerated iron infusion with no complaints voiced.  Peripheral IV site clean and dry with good blood return noted before and after infusion.  Band aid applied.  VSS with discharge and left in satisfactory condition with no s/s of distress noted.   

## 2022-06-03 ENCOUNTER — Inpatient Hospital Stay: Payer: 59 | Attending: Physician Assistant

## 2022-08-02 ENCOUNTER — Encounter: Payer: Self-pay | Admitting: Hematology

## 2022-08-09 ENCOUNTER — Inpatient Hospital Stay: Payer: Medicaid Other | Attending: Physician Assistant

## 2022-08-09 ENCOUNTER — Other Ambulatory Visit: Payer: Self-pay

## 2022-08-09 DIAGNOSIS — D693 Immune thrombocytopenic purpura: Secondary | ICD-10-CM | POA: Diagnosis not present

## 2022-08-09 DIAGNOSIS — D500 Iron deficiency anemia secondary to blood loss (chronic): Secondary | ICD-10-CM

## 2022-08-09 DIAGNOSIS — E611 Iron deficiency: Secondary | ICD-10-CM

## 2022-08-09 LAB — CBC WITH DIFFERENTIAL/PLATELET
Abs Immature Granulocytes: 0.02 10*3/uL (ref 0.00–0.07)
Basophils Absolute: 0 10*3/uL (ref 0.0–0.1)
Basophils Relative: 1 %
Eosinophils Absolute: 0.1 10*3/uL (ref 0.0–0.5)
Eosinophils Relative: 2 %
HCT: 47.5 % — ABNORMAL HIGH (ref 36.0–46.0)
Hemoglobin: 15.6 g/dL — ABNORMAL HIGH (ref 12.0–15.0)
Immature Granulocytes: 0 %
Lymphocytes Relative: 36 %
Lymphs Abs: 1.7 10*3/uL (ref 0.7–4.0)
MCH: 30.8 pg (ref 26.0–34.0)
MCHC: 32.8 g/dL (ref 30.0–36.0)
MCV: 93.7 fL (ref 80.0–100.0)
Monocytes Absolute: 0.4 10*3/uL (ref 0.1–1.0)
Monocytes Relative: 8 %
Neutro Abs: 2.6 10*3/uL (ref 1.7–7.7)
Neutrophils Relative %: 53 %
Platelets: 41 10*3/uL — ABNORMAL LOW (ref 150–400)
RBC: 5.07 MIL/uL (ref 3.87–5.11)
RDW: 13.1 % (ref 11.5–15.5)
WBC: 4.8 10*3/uL (ref 4.0–10.5)
nRBC: 0 % (ref 0.0–0.2)

## 2022-08-09 LAB — IRON AND TIBC
Iron: 133 ug/dL (ref 28–170)
Saturation Ratios: 37 % — ABNORMAL HIGH (ref 10.4–31.8)
TIBC: 356 ug/dL (ref 250–450)
UIBC: 223 ug/dL

## 2022-08-09 LAB — LACTATE DEHYDROGENASE: LDH: 130 U/L (ref 98–192)

## 2022-08-09 LAB — FERRITIN: Ferritin: 59 ng/mL (ref 11–307)

## 2022-08-15 NOTE — Progress Notes (Signed)
VIRTUAL VISIT via [place]   I connected with Caroline Brooks  on 08/16/2022 at  1:00 PM by telephone and verified that I am speaking with the correct person using two identifiers.  Location: Patient: Home Provider: [facility]   I discussed the limitations, risks, security and privacy concerns of performing an evaluation and management service by telephone and the availability of in person appointments. I also discussed with the patient that there may be a patient responsible charge related to this service. The patient expressed understanding and agreed to proceed.  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  She was last seen in clinic by Tarri Abernethy PA-C on 05/01/2022.  [name] received IV iron with Venofer 300 mg x 2 in November 2023.  She felt some slightly improved energy after IV iron, but continues to have residual baseline fatigue.  Ice pica has resolved.  She continues to have chronic lightheadedness and headaches.  She has intermittent chest pain and shortness of breath, but these are associated with panic attacks and increased anxiety.  She reports chronic nausea without vomiting.  No fevers, chills, night sweats, or unintentional weight loss.  She reports easy bruising of her extremities.  She had a petechial type rash of her hands and ankles about 2 weeks ago, resolved on its own after about 3 days.  She denies any mucosal blood blisters/wet purpura gum bleeding.  She reports epistaxis occurring about 3 times each month, lasting less than 15 minutes.  Despite uterine ablation, she continues to have menstrual periods described as irregular and heavy.  She denies any rectal bleeding, melena, or hematuria.  She has 50% energy and 100% appetite.   She endorses that she is maintaining a stable weight.  REVIEW OF SYSTEMS:  Review of Systems  Constitutional:  Positive for malaise/fatigue (chronic fatigue). Negative for chills, diaphoresis, fever and weight loss.  Respiratory:  Positive for shortness of breath (with anxiety/panic attacks). Negative for cough.   Cardiovascular:  Positive for chest pain (with anxiety/panic attacks). Negative for palpitations.  Gastrointestinal:  Positive for nausea. Negative for abdominal pain, blood in stool, melena and vomiting.  Neurological:  Positive for dizziness, tingling and headaches (occasional).  Psychiatric/Behavioral:  The patient is nervous/anxious.      PHYSICAL EXAM: (per limitations of virtual telephone visit)  The patient is alert and oriented x 3, exhibiting adequate mentation, good mood, and ability to speak in full sentences and execute sound judgement.  ASSESSMENT & PLAN:  1.   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  She also had thrombocytopenia during her pregnancies. - Ultrasound of abdomen (04/05/2016) shows normal spleen - Nutritional deficiency work-up was negative.  Hepatitis C antibody was positive, but hepatitis C RNA PCR was negative. - Her platelet count dropped to 21 on 11/26/2019, but improved to 112 after being treated with dexamethasone. - Most recent dexamethasone given on 04/26/2021 due to platelets 37 - She has intermittent petechiae and bruising as described in HPI - She reports menstrual cycles with heavy bleeding 2 to 3 days once every 3 weeks. - Most recent CBC (08/09/2022): Platelets 41.  Normal LDH. - PLAN: No steroids indicated at this time, but we will repeat CBC/D next week to ensure that her platelets have not  continued to drop - Labs in 3 months with repeat CBC and LDH.  Phone visit 1 week after labs.  - Patient will come back sooner if she develops petechial rash, severe bruising, or bleeding. - She has previously  had good response with dexamethasone - indicated if platelet count < 30,000 or she has active bleeding.   2.  Iron deficiency without anemia - Labs from 01/25/2022 showed ferritin 26 with iron saturation 23%.  Hgb 14.1/MCV 91.2. - Menstrual cycles with heavy bleeding 2 to 3 days once every 3 weeks.  No GI bleeding or hematuria. - She restarted daily iron tablet (ferrous sulfate 325 mg) in August 2023, but without any improvement in her iron levels.  Iron tablet was stopped due to constipation and lack of improvement. - She received Venofer 600 mg in November 2023.   - Labs (08/09/2022): Hgb 15.6/MCV 93.7.  Iron saturation 37%, ferritin 59. - PLAN: No indication for IV iron at this time. - Repeat iron panel at follow-up visit in 3 months.   PLAN SUMMARY: >> CBC/D next week (no later than Tuesday 08/20/22) >> Labs in 3 months (CBC/D, LDH, iron/TIBC, ferritin) >> PHONE visit in 3 months (1 to 2 days after labs)     I discussed the assessment and treatment plan with the patient. The patient was provided an opportunity to ask questions and all were answered. The patient agreed with the plan and demonstrated an understanding of the instructions.   The patient was advised to call back or seek an in-person evaluation if the symptoms worsen or if the condition fails to improve as anticipated.  I provided 22 minutes of non-face-to-face time during this encounter.  Harriett Rush, PA-C 08/16/22 2:47 PM

## 2022-08-16 ENCOUNTER — Inpatient Hospital Stay (HOSPITAL_BASED_OUTPATIENT_CLINIC_OR_DEPARTMENT_OTHER): Payer: Medicaid Other | Admitting: Physician Assistant

## 2022-08-16 DIAGNOSIS — D693 Immune thrombocytopenic purpura: Secondary | ICD-10-CM | POA: Diagnosis not present

## 2022-08-16 DIAGNOSIS — D500 Iron deficiency anemia secondary to blood loss (chronic): Secondary | ICD-10-CM | POA: Diagnosis not present

## 2022-08-19 ENCOUNTER — Other Ambulatory Visit: Payer: Self-pay

## 2022-08-19 DIAGNOSIS — D696 Thrombocytopenia, unspecified: Secondary | ICD-10-CM

## 2022-08-19 DIAGNOSIS — E611 Iron deficiency: Secondary | ICD-10-CM

## 2022-08-19 DIAGNOSIS — D693 Immune thrombocytopenic purpura: Secondary | ICD-10-CM

## 2022-08-19 DIAGNOSIS — D500 Iron deficiency anemia secondary to blood loss (chronic): Secondary | ICD-10-CM

## 2022-08-20 ENCOUNTER — Inpatient Hospital Stay: Payer: Medicaid Other

## 2022-09-04 ENCOUNTER — Inpatient Hospital Stay: Payer: Medicaid Other | Attending: Physician Assistant

## 2022-09-04 DIAGNOSIS — D693 Immune thrombocytopenic purpura: Secondary | ICD-10-CM | POA: Insufficient documentation

## 2022-09-04 DIAGNOSIS — D500 Iron deficiency anemia secondary to blood loss (chronic): Secondary | ICD-10-CM

## 2022-09-04 LAB — CBC WITH DIFFERENTIAL/PLATELET
Abs Immature Granulocytes: 0.03 10*3/uL (ref 0.00–0.07)
Basophils Absolute: 0.1 10*3/uL (ref 0.0–0.1)
Basophils Relative: 1 %
Eosinophils Absolute: 0.1 10*3/uL (ref 0.0–0.5)
Eosinophils Relative: 1 %
HCT: 47.5 % — ABNORMAL HIGH (ref 36.0–46.0)
Hemoglobin: 15.9 g/dL — ABNORMAL HIGH (ref 12.0–15.0)
Immature Granulocytes: 0 %
Lymphocytes Relative: 32 %
Lymphs Abs: 2.5 10*3/uL (ref 0.7–4.0)
MCH: 32.3 pg (ref 26.0–34.0)
MCHC: 33.5 g/dL (ref 30.0–36.0)
MCV: 96.3 fL (ref 80.0–100.0)
Monocytes Absolute: 0.7 10*3/uL (ref 0.1–1.0)
Monocytes Relative: 8 %
Neutro Abs: 4.6 10*3/uL (ref 1.7–7.7)
Neutrophils Relative %: 58 %
Platelets: 61 10*3/uL — ABNORMAL LOW (ref 150–400)
RBC: 4.93 MIL/uL (ref 3.87–5.11)
RDW: 14.4 % (ref 11.5–15.5)
WBC: 7.9 10*3/uL (ref 4.0–10.5)
nRBC: 0 % (ref 0.0–0.2)

## 2022-09-05 ENCOUNTER — Telehealth: Payer: Self-pay

## 2022-09-05 NOTE — Telephone Encounter (Addendum)
-----   Message from Harriett Rush, [place] sent at 09/04/2022  4:47 PM EDT ----- Horris Latino: Please call to discuss the following. >> Platelets are improved at 61, which is her baseline.  She does not need any steroid treatment of her low platelets at this time.   >> We will recheck labs as scheduled in about 3 months >> She should call us sooner for repeat labs if she has any significant bruising, bleeding, or other concerning symptoms.  Patient called back and was informed of above.  Verbalized understanding.

## 2022-09-05 NOTE — Telephone Encounter (Signed)
No answer and voice mailbox not set up.

## 2022-11-15 ENCOUNTER — Inpatient Hospital Stay: Payer: Medicaid Other | Attending: Physician Assistant

## 2022-11-15 DIAGNOSIS — R110 Nausea: Secondary | ICD-10-CM | POA: Insufficient documentation

## 2022-11-15 DIAGNOSIS — D693 Immune thrombocytopenic purpura: Secondary | ICD-10-CM | POA: Diagnosis not present

## 2022-11-15 DIAGNOSIS — D509 Iron deficiency anemia, unspecified: Secondary | ICD-10-CM | POA: Diagnosis not present

## 2022-11-15 DIAGNOSIS — D500 Iron deficiency anemia secondary to blood loss (chronic): Secondary | ICD-10-CM

## 2022-11-15 DIAGNOSIS — E611 Iron deficiency: Secondary | ICD-10-CM

## 2022-11-15 DIAGNOSIS — D696 Thrombocytopenia, unspecified: Secondary | ICD-10-CM

## 2022-11-15 LAB — CBC WITH DIFFERENTIAL/PLATELET
Abs Immature Granulocytes: 0.05 10*3/uL (ref 0.00–0.07)
Basophils Absolute: 0.1 10*3/uL (ref 0.0–0.1)
Basophils Relative: 1 %
Eosinophils Absolute: 0.1 10*3/uL (ref 0.0–0.5)
Eosinophils Relative: 1 %
HCT: 44.6 % (ref 36.0–46.0)
Hemoglobin: 15 g/dL (ref 12.0–15.0)
Immature Granulocytes: 0 %
Lymphocytes Relative: 25 %
Lymphs Abs: 3 10*3/uL (ref 0.7–4.0)
MCH: 33 pg (ref 26.0–34.0)
MCHC: 33.6 g/dL (ref 30.0–36.0)
MCV: 98.2 fL (ref 80.0–100.0)
Monocytes Absolute: 0.7 10*3/uL (ref 0.1–1.0)
Monocytes Relative: 6 %
Neutro Abs: 8.3 10*3/uL — ABNORMAL HIGH (ref 1.7–7.7)
Neutrophils Relative %: 67 %
Platelets: 82 10*3/uL — ABNORMAL LOW (ref 150–400)
RBC: 4.54 MIL/uL (ref 3.87–5.11)
RDW: 12.5 % (ref 11.5–15.5)
WBC: 12.2 10*3/uL — ABNORMAL HIGH (ref 4.0–10.5)
nRBC: 0 % (ref 0.0–0.2)

## 2022-11-15 LAB — IRON AND TIBC
Iron: 197 ug/dL — ABNORMAL HIGH (ref 28–170)
Saturation Ratios: 49 % — ABNORMAL HIGH (ref 10.4–31.8)
TIBC: 405 ug/dL (ref 250–450)
UIBC: 208 ug/dL

## 2022-11-15 LAB — LACTATE DEHYDROGENASE: LDH: 182 U/L (ref 98–192)

## 2022-11-15 LAB — FERRITIN: Ferritin: 56 ng/mL (ref 11–307)

## 2022-11-22 ENCOUNTER — Inpatient Hospital Stay (HOSPITAL_BASED_OUTPATIENT_CLINIC_OR_DEPARTMENT_OTHER): Payer: Medicaid Other | Admitting: Physician Assistant

## 2022-11-22 DIAGNOSIS — N920 Excessive and frequent menstruation with regular cycle: Secondary | ICD-10-CM

## 2022-11-22 DIAGNOSIS — D693 Immune thrombocytopenic purpura: Secondary | ICD-10-CM | POA: Diagnosis not present

## 2022-11-22 DIAGNOSIS — D500 Iron deficiency anemia secondary to blood loss (chronic): Secondary | ICD-10-CM | POA: Diagnosis not present

## 2022-11-22 NOTE — Progress Notes (Signed)
VIRTUAL VISIT via TELEPHONE NOTE [facility]   I connected with Caroline Brooks  on 11/22/2022 at 145 PM by telephone and verified that I am speaking with the correct person using two identifiers.  Location: Patient: Home Provider: [facility]   I discussed the limitations, risks, security and privacy concerns of performing an evaluation and management service by telephone and the availability of in person appointments. I also discussed with the patient that there may be a patient responsible charge related to this service. The patient expressed understanding and agreed to proceed.  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 is contacted today for follow-up of her chronic ITP and iron deficiency.  She was last evaluated via telemedicine visit by Rojelio Brenner PA-C on 08/16/2022.  She continues to have residual baseline fatigue and intermittent lightheadedness.  She denies any ice pica, headaches, or syncope.  She has intermittent chest pain and shortness of breath, but these are associated with panic attacks and increased anxiety.   She reports chronic nausea without vomiting.  No fevers, chills, night sweats, or unintentional weight loss.  She reports mild bruising of her extremities.  She denies any recent petechial rash, severe bruising, epistaxis, hematuria, wet purpura, or gum bleeding.  Her periods have been less heavy lately.  No rectal bleeding or melena.  She has 80% energy and 100% appetite.  She endorses that she is maintaining a stable weight.  REVIEW OF SYSTEMS:  Review of Systems  Constitutional:  Positive for malaise/fatigue (chronic fatigue). Negative for chills, diaphoresis, fever and weight loss.  Respiratory:  Positive for shortness of breath (with anxiety/panic attacks). Negative for cough.   Cardiovascular:  Positive for chest  pain (with anxiety/panic attacks). Negative for palpitations.  Gastrointestinal:  Positive for nausea. Negative for abdominal pain, blood in stool, melena and vomiting.  Neurological:  Positive for tingling (left foot and leg) and headaches (occasional). Negative for dizziness.  Psychiatric/Behavioral:  The patient is nervous/anxious.     PHYSICAL EXAM: (per limitations of virtual telephone visit)  The patient is alert and oriented x 3, exhibiting adequate mentation, good mood, and ability to speak in full sentences and execute sound judgement.  ASSESSMENT & PLAN:  1.   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  She also had thrombocytopenia during her pregnancies. - Ultrasound of abdomen (04/05/2016) shows normal spleen - Nutritional deficiency work-up was negative.  Hepatitis C antibody was positive, but hepatitis C RNA PCR was negative. - Her platelet count dropped to 21 on 11/26/2019, but improved to 112 after being treated with dexamethasone. - Most recent dexamethasone given on 04/26/2021 due to platelets 37 - She has intermittent petechiae and bruising as described in HPI -Her menstrual cycles are not as heavy as they used to be - Most recent CBC (11/15/2022): Platelets 82.  Normal LDH. - PLAN: No indication for treatment at this time.  (She has previously had good response with dexamethasone - indicated if platelet count < 30,000 or she has active bleeding.) - Labs in 3 months with repeat CBC and LDH.  Phone visit 1 week after labs.  - Patient will come back sooner if she develops petechial rash, severe bruising, or bleeding.   2.  Iron deficiency without anemia - Labs from 01/25/2022 showed ferritin 26 with iron saturation 23%.  Hgb 14.1/MCV 91.2. - Menstrual cycles  with heavy bleeding 2 to 3 days once every 3 weeks.  No GI bleeding or hematuria. - She restarted daily iron tablet (ferrous sulfate 325 mg) in August 2023, but without any improvement  in her iron levels.  Iron tablet was stopped due to constipation and lack of improvement. - She received Venofer 600 mg in November 2023.   - Labs (11/15/2022): Hgb 15.0/MCV 98.2.  Iron saturation 49%, ferritin 56. - PLAN: No indication for IV iron at this time.  (Although ferritin is less than 100, it is > 50, and she has elevated iron saturation and no evidence of anemia or symptoms of iron deficiency.) - Repeat iron panel at follow-up visit in 3 months.   PLAN SUMMARY: >> Labs in 3 months (CBC/D, LDH, iron/TIBC, ferritin) >> PHONE visit in 3 months (1 to 2 days after labs)     I discussed the assessment and treatment plan with the patient. The patient was provided an opportunity to ask questions and all were answered. The patient agreed with the plan and demonstrated an understanding of the instructions.   The patient was advised to call back or seek an in-person evaluation if the symptoms worsen or if the condition fails to improve as anticipated.  I provided 22 minutes of non-face-to-face time during this encounter.  Carnella Guadalajara, PA-C 11/22/22 5:42 PM

## 2023-02-19 ENCOUNTER — Inpatient Hospital Stay: Payer: Medicaid Other | Attending: Physician Assistant

## 2023-02-26 ENCOUNTER — Inpatient Hospital Stay: Payer: Medicaid Other | Admitting: Physician Assistant

## 2023-08-08 DIAGNOSIS — L03115 Cellulitis of right lower limb: Secondary | ICD-10-CM | POA: Diagnosis not present

## 2023-09-17 DIAGNOSIS — L02412 Cutaneous abscess of left axilla: Secondary | ICD-10-CM | POA: Diagnosis not present

## 2023-09-19 ENCOUNTER — Inpatient Hospital Stay

## 2023-09-22 ENCOUNTER — Inpatient Hospital Stay: Admitting: Physician Assistant

## 2023-09-22 ENCOUNTER — Encounter (HOSPITAL_COMMUNITY): Payer: Self-pay

## 2023-09-22 ENCOUNTER — Emergency Department (HOSPITAL_COMMUNITY)
Admission: EM | Admit: 2023-09-22 | Discharge: 2023-09-22 | Disposition: A | Discharge status: Home / Self Care | Attending: Emergency Medicine | Admitting: Emergency Medicine

## 2023-09-22 ENCOUNTER — Other Ambulatory Visit: Payer: Self-pay

## 2023-09-22 DIAGNOSIS — L02412 Cutaneous abscess of left axilla: Secondary | ICD-10-CM | POA: Insufficient documentation

## 2023-09-22 DIAGNOSIS — L03112 Cellulitis of left axilla: Secondary | ICD-10-CM | POA: Diagnosis not present

## 2023-09-22 MED ORDER — HYDROCODONE-ACETAMINOPHEN 5-325 MG PO TABS: 1.0000 | ORAL_TABLET | Freq: Four times a day (QID) | ORAL | 0 refills | Status: DC | PRN

## 2023-09-22 MED ORDER — LIDOCAINE-EPINEPHRINE (PF) 2 %-1:200000 IJ SOLN
20.0000 mL | Freq: Once | INTRAMUSCULAR | Status: AC
  Administered 2023-09-22: 20 mL
  Filled 2023-09-22: qty 20

## 2023-09-22 MED ORDER — DOXYCYCLINE HYCLATE 100 MG PO TABS
100.0000 mg | ORAL_TABLET | Freq: Once | ORAL | Status: AC
  Administered 2023-09-22: 100 mg via ORAL
  Filled 2023-09-22: qty 1

## 2023-09-22 MED ORDER — AMOXICILLIN-POT CLAVULANATE 875-125 MG PO TABS: 1.0000 | ORAL_TABLET | Freq: Two times a day (BID) | ORAL | 0 refills | Status: AC

## 2023-09-22 MED ORDER — MORPHINE SULFATE (PF) 4 MG/ML IV SOLN
4.0000 mg | Freq: Once | INTRAVENOUS | Status: AC
  Administered 2023-09-22: 4 mg via INTRAMUSCULAR
  Filled 2023-09-22: qty 1

## 2023-09-22 MED ORDER — HYDROCODONE-ACETAMINOPHEN 5-325 MG PO TABS
1.0000 | ORAL_TABLET | Freq: Once | ORAL | Status: AC
  Administered 2023-09-22: 1 via ORAL
  Filled 2023-09-22: qty 1

## 2023-09-22 MED ORDER — AMOXICILLIN-POT CLAVULANATE 875-125 MG PO TABS
1.0000 | ORAL_TABLET | Freq: Once | ORAL | Status: AC
  Administered 2023-09-22: 1 via ORAL
  Filled 2023-09-22: qty 1

## 2023-09-22 MED ORDER — DOXYCYCLINE HYCLATE 100 MG PO CAPS: 100.0000 mg | ORAL_CAPSULE | Freq: Two times a day (BID) | ORAL | 0 refills | Status: DC

## 2023-09-22 NOTE — ED Triage Notes (Signed)
 Pt presents via POV c/o multiple recurring abscesses located under both arms. Pt recently completed course of antibiotics for abscess, but reports they have returned.

## 2023-09-22 NOTE — ED Notes (Signed)
 Dressing placed to L axilla

## 2023-09-22 NOTE — Discharge Instructions (Signed)
 Please continue good wound care as discussed at the bedside.  Please take all antibiotics as directed.  Return to the emergency department in 2 days for reevaluation.  Return to emergency department immediately for any worsening symptoms, redness, fever.

## 2023-09-22 NOTE — ED Provider Notes (Signed)
 [place] EMERGENCY DEPARTMENT AT [facility] Provider Note   CSN: [id number] Arrival date & time: 09/22/23  4696     History  Chief Complaint  Patient presents with   Abscess    Caroline Brooks is a 33 y.o. female.  Patient is a 33 year old female with a past medical history of ITP who presents to the emergency department with a chief complaint of sites of abscess under bilateral axilla.  Patient notes that she was recently evaluated and placed on antibiotics and notes that she did complete them.  She notes that she has had recurrence of symptoms since that time.  Patient notes that she has been experiencing active discharge from the site underneath the left axilla.  Patient admits to surrounding redness extending to the left upper arm.  Patient denies any associated fever, chills, chest pain, shortness of breath, abdominal pain, nausea, vomiting, diarrhea.   Abscess      Home Medications Prior to Admission medications   Medication Sig Start Date End Date Taking? Authorizing Provider  ferrous sulfate 325 (65 FE) MG EC tablet Take 1 tablet (325 mg total) by mouth daily with breakfast. 02/01/22   Carnella Guadalajara, PA-C  hydrOXYzine (ATARAX) 25 MG tablet Take 1 tablet (25 mg total) by mouth every 8 (eight) hours as needed. 02/28/22   Masters, Katie, DO      Allergies    Patient has no known allergies.    Review of Systems   Review of Systems  Skin:        Abscess to bilateral axilla  All other systems reviewed and are negative.   Physical Exam Updated Vital Signs BP 116/68 (BP Location: Right Arm)   Pulse 100   Temp (!) 97.5 F (36.4 C) (Temporal)   Resp 18   Ht 5\' 9"  (1.753 m)   Wt 70.3 kg   SpO2 100%   BMI 22.89 kg/m  Physical Exam Vitals and nursing note reviewed.  Constitutional:      Appearance: Normal appearance.  HENT:     Head: Normocephalic and atraumatic.     Nose: Nose normal.     Mouth/Throat:     Mouth: Mucous membranes are  moist.  Eyes:     Extraocular Movements: Extraocular movements intact.     Conjunctiva/sclera: Conjunctivae normal.     Pupils: Pupils are equal, round, and reactive to light.  Cardiovascular:     Rate and Rhythm: Normal rate and regular rhythm.     Pulses: Normal pulses.     Heart sounds: Normal heart sounds.  Pulmonary:     Effort: Pulmonary effort is normal.     Breath sounds: Normal breath sounds.  Musculoskeletal:        General: Normal range of motion.     Cervical back: Normal range of motion and neck supple.  Skin:    General: Skin is warm and dry.     Comments: Abscess site to left axilla with active drainage and moderate size, multiple small areas of induration noted to the right axilla with no active drainage or discharge, surrounding cellulitis noted to left axilla and left upper arm, no lymphatic streaking  Neurological:     General: No focal deficit present.     Mental Status: She is alert and oriented to person, place, and time. Mental status is at baseline.  Psychiatric:        Mood and Affect: Mood normal.        Behavior: Behavior normal.  Thought Content: Thought content normal.        Judgment: Judgment normal.     ED Results / Procedures / Treatments   Labs (all labs ordered are listed, but only abnormal results are displayed) Labs Reviewed - No data to display  EKG None  Radiology No results found.  Procedures .Incision and Drainage  Date/Time: 09/22/2023 10:35 AM  Performed by: Lelon Perla, PA-C Authorized by: Lelon Perla, PA-C   Consent:    Consent obtained:  Verbal   Consent given by:  Patient   Risks discussed:  Bleeding, incomplete drainage, pain and infection   Alternatives discussed:  No treatment, delayed treatment and alternative treatment Universal protocol:    Procedure explained and questions answered to patient or proxy's satisfaction: yes     Relevant documents present and verified: yes     Site/side  marked: yes     Immediately prior to procedure, a time out was called: yes     Patient identity confirmed:  Verbally with patient, arm band, provided demographic data and hospital-assigned identification number Location:    Type:  Abscess   Location:  Upper extremity   Upper extremity location: Left axilla. Pre-procedure details:    Skin preparation:  Povidone-iodine Sedation:    Sedation type:  None Anesthesia:    Anesthesia method:  Local infiltration   Local anesthetic:  Lidocaine 2% WITH epi Procedure type:    Complexity:  Simple Procedure details:    Ultrasound guidance: no     Needle aspiration: no     Incision types:  Stab incision   Incision depth:  Dermal   Wound management:  Probed and deloculated   Drainage:  Purulent   Drainage amount:  Moderate   Packing materials:  1/2 in iodoform gauze Post-procedure details:    Procedure completion:  Tolerated well, no immediate complications     Medications Ordered in ED Medications  lidocaine-EPINEPHrine (XYLOCAINE W/EPI) 2 %-1:200000 (PF) injection 20 mL (has no administration in time range)  morphine (PF) 4 MG/ML injection 4 mg (has no administration in time range)  amoxicillin-clavulanate (AUGMENTIN) 875-125 MG per tablet 1 tablet (has no administration in time range)  doxycycline (VIBRA-TABS) tablet 100 mg (has no administration in time range)    ED Course/ Medical Decision Making/ A&P                                 Medical Decision Making Patient is doing well at this time and is stable for discharge home.  Patient has abscess formation over the left axilla and areas of induration to the right axilla.  Incision and drainage was performed to 2 sites under the left axilla.  Patient does have mild surrounding cellulitis at this point.  She has no indication for necrotizing fasciitis or associated lymphatic streaking.  She has stable vital signs with no indication for sepsis.  Do not suspect that admission or IV  antibiotics are warranted at this time.  Site was packed and patient was directed to return to the emergency department in 2 days for reevaluation.  Strict return precautions were discussed for any new or worsening symptoms.  Patient does have some erythema to the inferior right eyelid with no signs of involvement of the tear duct.  Patient voiced understanding to the plan and had no additional questions.  Risk Prescription drug management.           Final Clinical  Impression(s) / ED Diagnoses Final diagnoses:  None    Rx / DC Orders ED Discharge Orders     None         Lelon Perla, PA-C 09/22/23 1037    Pricilla Loveless, MD 09/22/23 [phone]

## 2023-10-08 ENCOUNTER — Inpatient Hospital Stay: Attending: Hematology

## 2023-10-08 DIAGNOSIS — D509 Iron deficiency anemia, unspecified: Secondary | ICD-10-CM | POA: Diagnosis present

## 2023-10-08 DIAGNOSIS — D693 Immune thrombocytopenic purpura: Secondary | ICD-10-CM

## 2023-10-08 DIAGNOSIS — D500 Iron deficiency anemia secondary to blood loss (chronic): Secondary | ICD-10-CM

## 2023-10-08 LAB — CBC WITH DIFFERENTIAL/PLATELET
Abs Immature Granulocytes: 0.01 10*3/uL (ref 0.00–0.07)
Basophils Absolute: 0.1 10*3/uL (ref 0.0–0.1)
Basophils Relative: 1 %
Eosinophils Absolute: 0.1 10*3/uL (ref 0.0–0.5)
Eosinophils Relative: 2 %
HCT: 40.2 % (ref 36.0–46.0)
Hemoglobin: 12.6 g/dL (ref 12.0–15.0)
Immature Granulocytes: 0 %
Lymphocytes Relative: 38 %
Lymphs Abs: 2.1 10*3/uL (ref 0.7–4.0)
MCH: 26.9 pg (ref 26.0–34.0)
MCHC: 31.3 g/dL (ref 30.0–36.0)
MCV: 85.9 fL (ref 80.0–100.0)
Monocytes Absolute: 0.5 10*3/uL (ref 0.1–1.0)
Monocytes Relative: 9 %
Neutro Abs: 2.8 10*3/uL (ref 1.7–7.7)
Neutrophils Relative %: 50 %
Platelets: 44 10*3/uL — ABNORMAL LOW (ref 150–400)
RBC: 4.68 MIL/uL (ref 3.87–5.11)
RDW: 14.8 % (ref 11.5–15.5)
WBC: 5.5 10*3/uL (ref 4.0–10.5)
nRBC: 0 % (ref 0.0–0.2)

## 2023-10-08 LAB — IRON AND TIBC
Iron: 79 ug/dL (ref 28–170)
Saturation Ratios: 17 % (ref 10.4–31.8)
TIBC: 455 ug/dL — ABNORMAL HIGH (ref 250–450)
UIBC: 376 ug/dL

## 2023-10-08 LAB — LACTATE DEHYDROGENASE: LDH: 147 U/L (ref 98–192)

## 2023-10-08 LAB — FERRITIN: Ferritin: 22 ng/mL (ref 11–307)

## 2023-10-14 NOTE — Progress Notes (Deleted)
 [facility] [address], [place] [address]   CLINIC:  Medical Oncology/Hematology  PCP:  [facility], [facility] [address] [place] [address] [phone]   REASON FOR VISIT: Chronic ITP and iron  deficiency anemia   CURRENT THERAPY: Intermittent steroids and IV iron   INTERVAL HISTORY:   [name] 33 y.o. female returns for routine follow-up of chronic ITP and iron  deficiency.  She was last evaluated via telemedicine visit by Sheril Dines PA-C on 11/22/2022.   She continues to have residual baseline fatigue and intermittent lightheadedness.*** ***  She denies any ice pica, headaches, or syncope.  *** She has intermittent chest pain and shortness of breath, but these are associated with panic attacks and increased anxiety.    ***She reports chronic nausea without vomiting. ***  No fevers, chills, night sweats, or unintentional weight loss.   She reports mild bruising of her extremities.  *** ***She denies any recent petechial rash, severe bruising, epistaxis, hematuria, wet purpura, or gum bleeding.   ***Her periods have been less heavy lately.   ***No rectal bleeding or melena.   She has ***% energy and ***% appetite. She endorses that she is maintaining a stable weight.  ASSESSMENT & PLAN:  1.   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  She also had thrombocytopenia during her pregnancies. - Ultrasound of abdomen (04/05/2016) shows normal spleen - Nutritional deficiency work-up was negative.  Hepatitis C antibody was positive, but hepatitis C RNA PCR was negative. - Her platelet count dropped to 21 on 11/26/2019, but improved to 112 after being treated with dexamethasone . - Most recent dexamethasone  given on 04/26/2021 due to platelets 37 - She has intermittent petechiae and bruising as described in HPI*** - Her menstrual cycles are not as heavy as they used to be*** - Most recent CBC  (10/08/2023): Platelets 44.  Normal LDH. - PLAN: No indication for treatment at this time.  (She has previously had good response with dexamethasone  - indicated if platelet count < 30,000 or she has active bleeding.)*** - Labs in 3 months with repeat CBC and LDH.  Phone visit 1 week after labs. *** - Patient will come back sooner if she develops petechial rash, severe bruising, or bleeding.***   2.  Iron  deficiency without anemia - Labs from 01/25/2022 showed ferritin 26 with iron  saturation 23%.  Hgb 14.1/MCV 91.2. - Menstrual cycles with heavy bleeding 2 to 3 days once every 3 weeks.  No GI bleeding or hematuria. - She restarted daily iron  tablet (ferrous sulfate  325 mg) in August 2023, but without any improvement in her iron  levels.  Iron  tablet was stopped due to constipation and lack of improvement. - She received Venofer  600 mg in November 2023.   - Labs (10/08/2023): Hgb 12.6/MCV 85.9.  Iron  saturation 17%, ferritin 22, elevated TIBC 455 - PLAN: Recommend IV Venofer  400 mg x 2.  *** - Repeat iron  panel at follow-up visit in 3*** months.  PLAN SUMMARY: >> Venofer  400 mg x 2 >> Labs in 3 months (CBC/D, LDH, iron /TIBC, ferritin) >> PHONE visit in 3 months (1 to 2 days after labs) >> ***    [facility] **VISIT SUMMARY & IMPORTANT INSTRUCTIONS **   You were seen today by Sheril Dines PA-C for your ***.    *** ***  *** ***  LABS: Return in ***   OTHER TESTS: ***  MEDICATIONS: ***  FOLLOW-UP APPOINTMENT: ***  REVIEW OF SYSTEMS: ***  Review of Systems - Oncology   PHYSICAL EXAM:  ECOG PERFORMANCE STATUS: {CHL ONC ECOG ZO:[id number]} *** There were no vitals filed for this visit. There were no vitals filed for this visit. Physical Exam  PAST MEDICAL/SURGICAL HISTORY:  Past Medical History:  Diagnosis Date   Alcohol abuse    Menorrhagia with irregular cycle 08/23/2019   Narcotic abuse (HCC)    Thrombocytopenia (HCC)  07/07/2019   07/06/19 platelets 38 ck platelet profile   Past Surgical History:  Procedure Laterality Date   BILATERAL SALPINGECTOMY Bilateral 11/18/2014   Procedure: BILATERAL SALPINGECTOMY;  Surgeon: Wendelyn Halter, MD;  Location: [facility];  Service: Obstetrics;  Laterality: Bilateral;   CESAREAN SECTION  2011   CESAREAN SECTION  11/26/2011   Procedure: CESAREAN SECTION;  Surgeon: Wendelyn Halter, MD;  Location: [facility];  Service: Gynecology;  Laterality: N/A;   CESAREAN SECTION N/A 11/18/2014   Procedure: CESAREAN SECTION;  Surgeon: Wendelyn Halter, MD;  Location: [facility];  Service: Obstetrics;  Laterality: N/A;   DILATATION AND CURETTAGE/HYSTEROSCOPY WITH MINERVA N/A 11/03/2019   Procedure: DILATATION AND CURETTAGE/HYSTEROSCOPY WITH MINERVA;  Surgeon: Wendelyn Halter, MD;  Location: [facility];  Service: Gynecology;  Laterality: N/A;    SOCIAL HISTORY:  Social History   Socioeconomic History   Marital status: Married    Spouse name: Not on file   Number of children: 3   Years of education: Not on file   Highest education level: Not on file  Occupational History   Occupation: umemployed  Tobacco Use   Smoking status: Some Days    Current packs/day: 0.25    Average packs/day: 0.3 packs/day for 5.0 years (1.3 ttl pk-yrs)    Types: Cigarettes   Smokeless tobacco: Never  Vaping Use   Vaping status: Never Used  Substance and Sexual Activity   Alcohol use: Not Currently   Drug use: No   Sexual activity: Not Currently    Birth control/protection: Surgical  Other Topics Concern   Not on file  Social History Narrative   Not on file   Social Drivers of Health   Financial Resource Strain: Medium Risk (05/02/2020)   Overall Financial Resource Strain (CARDIA)    Difficulty of Paying Living Expenses: Somewhat hard  Food Insecurity: No Food Insecurity (05/02/2020)   Hunger Vital Sign    Worried About Running Out of Food in the Last Year: Never true    Ran Out of Food in the Last Year: Never true   Transportation Needs: No Transportation Needs (05/02/2020)   PRAPARE - Administrator, Civil Service (Medical): No    Lack of Transportation (Non-Medical): No  Physical Activity: Inactive (05/02/2020)   Exercise Vital Sign    Days of Exercise per Week: 0 days    Minutes of Exercise per Session: 0 min  Stress: No Stress Concern Present (05/02/2020)   [employer] of Occupational Health - Occupational Stress Questionnaire    Feeling of Stress : Not at all  Social Connections: Moderately Isolated (05/02/2020)   Social Connection and Isolation Panel [NHANES]    Frequency of Communication with Friends and Family: More than three times a week    Frequency of Social Gatherings with Friends and Family: Twice a week    Attends Religious Services: Never    Database administrator or Organizations: No    Attends Banker Meetings: Never    Marital Status: Married  Catering manager Violence: Not At  Risk (05/02/2020)   Humiliation, Afraid, Rape, and Kick questionnaire    Fear of Current or Ex-Partner: No    Emotionally Abused: No    Physically Abused: No    Sexually Abused: No    FAMILY HISTORY:  Family History  Problem Relation Age of Onset   Cancer Mother 30       breast    Hypertension Father    Diabetes Father    Other Brother        Print production planner 1   Cancer Maternal Grandfather        prostate   Hypertension Maternal Grandfather    Early death Paternal Grandfather    Alcohol abuse Paternal Grandfather    Healthy Daughter    Anemia Maternal Grandmother    Other Daughter        pulmonary stenosis   Healthy Son     CURRENT MEDICATIONS:  Outpatient Encounter Medications as of 10/15/2023  Medication Sig   doxycycline  (VIBRAMYCIN ) 100 MG capsule Take 1 capsule (100 mg total) by mouth 2 (two) times daily.   ferrous sulfate  325 (65 FE) MG EC tablet Take 1 tablet (325 mg total) by mouth daily with breakfast.   HYDROcodone -acetaminophen  (NORCO/VICODIN) 5-325 MG  tablet Take 1 tablet by mouth every 6 (six) hours as needed for severe pain (pain score 7-10).   hydrOXYzine  (ATARAX ) 25 MG tablet Take 1 tablet (25 mg total) by mouth every 8 (eight) hours as needed.   No facility-administered encounter medications on file as of 10/15/2023.    ALLERGIES:  No Known Allergies  LABORATORY DATA:  I have reviewed the labs as listed.  CBC    Component Value Date/Time   WBC 5.5 10/08/2023 1324   RBC 4.68 10/08/2023 1324   HGB 12.6 10/08/2023 1324   HGB 14.2 08/23/2019 1117   HCT 40.2 10/08/2023 1324   HCT 41.5 08/23/2019 1117   PLT 44 (L) 10/08/2023 1324   PLT 49 (LL) 08/23/2019 1117   MCV 85.9 10/08/2023 1324   MCV 89 08/23/2019 1117   MCH 26.9 10/08/2023 1324   MCHC 31.3 10/08/2023 1324   RDW 14.8 10/08/2023 1324   RDW 12.4 08/23/2019 1117   LYMPHSABS 2.1 10/08/2023 1324   LYMPHSABS 2.9 07/05/2019 1226   MONOABS 0.5 10/08/2023 1324   EOSABS 0.1 10/08/2023 1324   EOSABS 0.0 07/05/2019 1226   BASOSABS 0.1 10/08/2023 1324   BASOSABS 0.1 07/05/2019 1226      Latest Ref Rng & Units 01/31/2020    2:12 PM 11/26/2019    9:01 AM 11/01/2019    2:05 PM  CMP  Glucose 70 - 99 mg/dL 811  914  782   BUN 6 - 20 mg/dL 8  7  14    Creatinine 0.44 - 1.00 mg/dL 9.56  2.13  0.86   Sodium 135 - 145 mmol/L 139  138  133   Potassium 3.5 - 5.1 mmol/L 4.2  4.9  4.0   Chloride 98 - 111 mmol/L 103  102  101   CO2 22 - 32 mmol/L 28  28  24    Calcium 8.9 - 10.3 mg/dL 9.1  9.2  9.2   Total Protein 6.5 - 8.1 g/dL 7.2  7.1  7.2   Total Bilirubin 0.3 - 1.2 mg/dL 0.4  0.5  0.5   Alkaline Phos 38 - 126 U/L 73  65  63   AST 15 - 41 U/L 15  17  18    ALT 0 - 44 U/L 12  17  19     DIAGNOSTIC IMAGING:  I have independently reviewed the relevant imaging and discussed with the patient.   WRAP UP:  All questions were answered. The patient knows to call the clinic with any problems, questions or concerns.  Medical decision making: ***  Time spent on visit: I spent ***  minutes counseling the patient face to face. The total time spent in the appointment was *** minutes and more than 50% was on counseling.  Sonnie Dusky, PA-C  ***

## 2023-10-15 ENCOUNTER — Inpatient Hospital Stay: Admitting: Physician Assistant

## 2023-11-20 ENCOUNTER — Other Ambulatory Visit: Payer: Self-pay

## 2023-11-20 ENCOUNTER — Encounter (HOSPITAL_BASED_OUTPATIENT_CLINIC_OR_DEPARTMENT_OTHER): Payer: Self-pay

## 2023-11-20 DIAGNOSIS — R220 Localized swelling, mass and lump, head: Secondary | ICD-10-CM | POA: Insufficient documentation

## 2023-11-20 DIAGNOSIS — Z5321 Procedure and treatment not carried out due to patient leaving prior to being seen by health care provider: Secondary | ICD-10-CM | POA: Insufficient documentation

## 2023-11-20 NOTE — ED Triage Notes (Signed)
 Pt reports having ITP with recurrent cysts over past few months (different places). Currently has swelling/redness on bottom/top/and outside of right eye. No changes in vision per patient.

## 2023-11-21 ENCOUNTER — Emergency Department (HOSPITAL_BASED_OUTPATIENT_CLINIC_OR_DEPARTMENT_OTHER)
Admission: EM | Admit: 2023-11-21 | Discharge: 2023-11-21 | Discharge status: Against Medical Advice | Payer: Self-pay | Attending: Emergency Medicine | Admitting: Emergency Medicine

## 2023-11-24 ENCOUNTER — Encounter: Payer: Self-pay | Admitting: Hematology

## 2024-02-03 ENCOUNTER — Other Ambulatory Visit: Payer: Self-pay | Admitting: *Deleted

## 2024-03-13 ENCOUNTER — Emergency Department (HOSPITAL_BASED_OUTPATIENT_CLINIC_OR_DEPARTMENT_OTHER)

## 2024-03-13 ENCOUNTER — Emergency Department (HOSPITAL_BASED_OUTPATIENT_CLINIC_OR_DEPARTMENT_OTHER): Admitting: Radiology

## 2024-03-13 ENCOUNTER — Encounter (HOSPITAL_BASED_OUTPATIENT_CLINIC_OR_DEPARTMENT_OTHER): Payer: Self-pay

## 2024-03-13 ENCOUNTER — Emergency Department (HOSPITAL_BASED_OUTPATIENT_CLINIC_OR_DEPARTMENT_OTHER)
Admission: EM | Admit: 2024-03-13 | Discharge: 2024-03-13 | Disposition: A | Discharge status: Home / Self Care | Attending: Emergency Medicine | Admitting: Emergency Medicine

## 2024-03-13 ENCOUNTER — Other Ambulatory Visit: Payer: Self-pay

## 2024-03-13 DIAGNOSIS — S6991XA Unspecified injury of right wrist, hand and finger(s), initial encounter: Secondary | ICD-10-CM

## 2024-03-13 DIAGNOSIS — S7012XA Contusion of left thigh, initial encounter: Secondary | ICD-10-CM | POA: Diagnosis not present

## 2024-03-13 DIAGNOSIS — S01511A Laceration without foreign body of lip, initial encounter: Secondary | ICD-10-CM | POA: Diagnosis not present

## 2024-03-13 DIAGNOSIS — S60021A Contusion of right index finger without damage to nail, initial encounter: Secondary | ICD-10-CM | POA: Diagnosis not present

## 2024-03-13 DIAGNOSIS — S0181XA Laceration without foreign body of other part of head, initial encounter: Secondary | ICD-10-CM

## 2024-03-13 DIAGNOSIS — R519 Headache, unspecified: Secondary | ICD-10-CM | POA: Diagnosis present

## 2024-03-13 LAB — BASIC METABOLIC PANEL WITH GFR
Anion gap: 13 (ref 5–15)
BUN: 11 mg/dL (ref 6–20)
CO2: 24 mmol/L (ref 22–32)
Calcium: 10.3 mg/dL (ref 8.9–10.3)
Chloride: 101 mmol/L (ref 98–111)
Creatinine, Ser: 0.86 mg/dL (ref 0.44–1.00)
GFR, Estimated: >60 mL/min (ref >60)
Glucose, Bld: 108 mg/dL — ABNORMAL HIGH (ref 70–99)
Potassium: 3.9 mmol/L (ref 3.5–5.1)
Sodium: 138 mmol/L (ref 135–145)

## 2024-03-13 LAB — CBC WITH DIFFERENTIAL/PLATELET
Abs Immature Granulocytes: 0.02 K/uL (ref 0.00–0.07)
Basophils Absolute: 0.1 K/uL (ref 0.0–0.1)
Basophils Relative: 1 %
Eosinophils Absolute: 0.2 K/uL (ref 0.0–0.5)
Eosinophils Relative: 2 %
HCT: 39.7 % (ref 36.0–46.0)
Hemoglobin: 12.9 g/dL (ref 12.0–15.0)
Immature Granulocytes: 0 %
Lymphocytes Relative: 32 %
Lymphs Abs: 2.5 K/uL (ref 0.7–4.0)
MCH: 29.2 pg (ref 26.0–34.0)
MCHC: 32.5 g/dL (ref 30.0–36.0)
MCV: 89.8 fL (ref 80.0–100.0)
Monocytes Absolute: 0.6 K/uL (ref 0.1–1.0)
Monocytes Relative: 8 %
Neutro Abs: 4.6 K/uL (ref 1.7–7.7)
Neutrophils Relative %: 57 %
Platelets: 120 K/uL — ABNORMAL LOW (ref 150–400)
RBC: 4.42 MIL/uL (ref 3.87–5.11)
RDW: 14.2 % (ref 11.5–15.5)
WBC: 7.9 K/uL (ref 4.0–10.5)
nRBC: 0 % (ref 0.0–0.2)

## 2024-03-13 LAB — HCG, SERUM, QUALITATIVE: Preg, Serum: NEGATIVE

## 2024-03-13 NOTE — ED Notes (Signed)
 Alert, NAD, calm, interactive, resps e/u speaking in clear complete sentences, steady gait, ready to go, encouraged to wait for results, Verbalizes need a dental referral.
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
## 2024-03-13 NOTE — ED Notes (Signed)
 Alert, NAD, calm, interactive. To CT via w/c.

## 2024-03-13 NOTE — ED Notes (Signed)
EDP into room, at BS.  ?

## 2024-03-13 NOTE — Discharge Instructions (Addendum)
 Follow-up with dentist regarding the avulsions of the 2 front upper incisors.  The inner upper lip laceration split will heal on its own.  CT head neck and face without any significant abnormalities x-ray of the left femur without any bony abnormalities.  Return for any new or worse symptoms.    No adult dentist coverage.

## 2024-03-13 NOTE — ED Notes (Signed)
 Back from xray/ CT via w/c, alert, NAD, calm. Family at [facility].

## 2024-06-06 ENCOUNTER — Emergency Department (HOSPITAL_COMMUNITY)
Admission: EM | Admit: 2024-06-06 | Discharge: 2024-06-06 | Disposition: A | Discharge status: Home / Self Care | Attending: Emergency Medicine | Admitting: Emergency Medicine

## 2024-06-06 ENCOUNTER — Other Ambulatory Visit: Payer: Self-pay

## 2024-06-06 ENCOUNTER — Encounter (HOSPITAL_COMMUNITY): Payer: Self-pay

## 2024-06-06 DIAGNOSIS — L0291 Cutaneous abscess, unspecified: Secondary | ICD-10-CM

## 2024-06-06 MED ORDER — DOXYCYCLINE HYCLATE 100 MG PO CAPS: 100.0000 mg | ORAL_CAPSULE | Freq: Two times a day (BID) | ORAL | 0 refills | Status: AC

## 2024-06-06 MED ORDER — LIDOCAINE-EPINEPHRINE 2 %-1:100000 IJ SOLN
20.0000 mL | Freq: Once | INTRAMUSCULAR | Status: DC
  Filled 2024-06-06: qty 20

## 2024-06-06 MED ORDER — LIDOCAINE-EPINEPHRINE (PF) 2 %-1:200000 IJ SOLN
INTRAMUSCULAR | Status: AC
  Filled 2024-06-06: qty 20

## 2024-06-06 MED ORDER — LIDOCAINE-EPINEPHRINE (PF) 2 %-1:200000 IJ SOLN
1.0000 mL | INTRAMUSCULAR | Status: DC
  Administered 2024-06-06: 1 mL

## 2024-06-06 NOTE — Discharge Instructions (Signed)
 We were able to drain the abscess, you can take doxycycline  twice a day to help prevent this from coming back and to treat the rest of the infection around the abscess.  If you do develop severe worsening swelling fevers or any worsening symptoms return to the emergency department immediately  Doxycycline  is an antibiotic which is taken twice a day, this treats bacterial infections that can cause staph infections, it treats sinus infections and some pneumonia. It also treats tick infections like Lyme's disease.  In this case I would like for you to take the antibiotic exactly as prescribed until it is completed.  Please be aware that occasionally people will get a rash if they are in the sunlight for extended periods of time while taking this medicine.  Tylenol  or ibuprofen  for pain

## 2024-06-06 NOTE — ED Triage Notes (Signed)
 Pt to Ed with c/o abscess to side of left head x 3 days

## 2024-06-06 NOTE — ED Notes (Signed)
Dressing applied to left temple.

## 2024-06-06 NOTE — ED Provider Notes (Signed)
 [facility] Provider Note   CSN: [id number] Arrival date & time: 06/06/24  8048     Patient presents with: Abscess   Caroline Brooks is a 33 y.o. female.    Abscess    This patient is a 33 year old female presenting with a left temporal area where there is a small cyst which is growing in size red and tender to the touch.  No fevers or chills no nausea vomiting no changes in vision.  Has been there for 5 days  Prior to Admission medications  Medication Sig Start Date End Date Taking? Authorizing Provider  doxycycline  (VIBRAMYCIN ) 100 MG capsule Take 1 capsule (100 mg total) by mouth 2 (two) times daily. 06/06/24  Yes Cleotilde Rogue, MD  ferrous sulfate  325 (65 FE) MG EC tablet Take 1 tablet (325 mg total) by mouth daily with breakfast. 02/01/22   Lamon Herter M, PA-C  hydrOXYzine  (ATARAX ) 25 MG tablet Take 1 tablet (25 mg total) by mouth every 8 (eight) hours as needed. 02/28/22   Masters, Izetta, DO    Allergies: Patient has no known allergies.    Review of Systems  All other systems reviewed and are negative.   Updated Vital Signs BP (!) 145/81 (BP Location: Right Arm)   Pulse (!) 109   Temp 98.1 F (36.7 C)   Resp 16   Ht 1.727 m (5' 8)   Wt 68 kg   LMP 04/26/2024 (Approximate)   SpO2 99%   BMI 22.81 kg/m   Physical Exam Vitals and nursing note reviewed.  Constitutional:      Appearance: She is well-developed. She is not diaphoretic.  HENT:     Head: Normocephalic and atraumatic.  Eyes:     General:        Right eye: No discharge.        Left eye: No discharge.     Conjunctiva/sclera: Conjunctivae normal.  Cardiovascular:     Rate and Rhythm: Tachycardia present.     Comments: Of 105 bpm on my exam Pulmonary:     Effort: Pulmonary effort is normal. No respiratory distress.  Skin:    General: Skin is warm and dry.     Findings: Erythema and rash present.     Comments: Small abscess approximately 2 cm  in diameter over the left temporal region, otherwise skin appears normal with no surrounding induration  Neurological:     General: No focal deficit present.     Mental Status: She is alert.     Coordination: Coordination normal.     (all labs ordered are listed, but only abnormal results are displayed) Labs Reviewed - No data to display  EKG: None  Radiology: No results found.   .Incision and Drainage  Date/Time: 06/06/2024 8:25 PM  Performed by: Cleotilde Rogue, MD Authorized by: Cleotilde Rogue, MD   Consent:    Consent obtained:  Verbal   Consent given by:  Patient   Risks discussed:  Bleeding, damage to other organs, incomplete drainage, infection and pain   Alternatives discussed:  Delayed treatment Universal protocol:    Procedure explained and questions answered to patient or proxy's satisfaction: yes     Immediately prior to procedure, a time out was called: yes     Patient identity confirmed:  Verbally with patient Location:    Type:  Abscess   Size:  2 cm   Location:  Head Pre-procedure details:    Skin preparation:  Betadine Sedation:  Sedation type:  None Anesthesia:    Anesthesia method:  Local infiltration   Local anesthetic:  1 mL lidocaine -EPINEPHrine  2 %-1:200000 Procedure type:    Complexity:  Complex Procedure details:    Ultrasound guidance: no     Needle aspiration: no     Incision types:  Single straight   Incision depth:  Submucosal   Wound management:  Probed and deloculated, irrigated with saline and extensive cleaning   Drainage:  Purulent   Drainage amount:  Moderate   Wound treatment:  Wound left open   Packing materials:  None Post-procedure details:    Procedure completion:  Tolerated well, no immediate complications Comments:          Medications Ordered in the ED  lidocaine -EPINEPHrine  (XYLOCAINE  W/EPI) 2 %-1:100000 (with pres) injection 20 mL (has no administration in time range)  lidocaine -EPINEPHrine  (XYLOCAINE  W/EPI)  2 %-1:200000 (PF) injection (has no administration in time range)                                    Medical Decision Making Risk Prescription drug management.   The patient has normal cranial nerve exam, there is no facial droop or difficulty with her eye movements.  This does not involve the eye tissue of the periorbital tissue or the lids.  It appears that she has a small abscess, I informed her of the risks of draining this including temporal artery or facial nerve injury, she is agreeable to proceed despite these risks and understands that although it is a low likelihood there is a possibility of underlying structures being damaged.  There is no surrounding cellulitis to suggest need for antibiotics  Incision and drainage was successful, patient agreeable to the plan, home with doxycycline      Final diagnoses:  Abscess    ED Discharge Orders          Ordered    doxycycline  (VIBRAMYCIN ) 100 MG capsule  2 times daily        06/06/24 2024               Cleotilde Rogue, MD 06/06/24 2027

## 2024-08-17 ENCOUNTER — Inpatient Hospital Stay

## 2024-08-24 ENCOUNTER — Inpatient Hospital Stay: Admitting: Physician Assistant
